# Patient Record
Sex: Male | Born: 1970 | Race: White | Hispanic: No | Marital: Single | Smoking: Current every day smoker
Health system: Southern US, Community
[De-identification: ages and names within clinical notes are randomized; demographics above are authoritative.]

## PROBLEM LIST (undated history)

## (undated) ENCOUNTER — Emergency Department (HOSPITAL_BASED_OUTPATIENT_CLINIC_OR_DEPARTMENT_OTHER): Admission: EM | Payer: Medicare PPO | Source: Home / Self Care

## (undated) DIAGNOSIS — F319 Bipolar disorder, unspecified: Secondary | ICD-10-CM

## (undated) DIAGNOSIS — Z5900 Homelessness unspecified: Secondary | ICD-10-CM

---

## 2010-08-28 ENCOUNTER — Emergency Department (HOSPITAL_COMMUNITY): Payer: Medicare Other

## 2010-08-28 ENCOUNTER — Emergency Department (HOSPITAL_COMMUNITY)
Admission: EM | Admit: 2010-08-28 | Discharge: 2010-08-29 | Disposition: A | Discharge status: Other Acute Inpatient Hospital | Payer: Medicare Other | Source: Home / Self Care | Attending: Emergency Medicine | Admitting: Emergency Medicine

## 2010-08-28 DIAGNOSIS — F29 Unspecified psychosis not due to a substance or known physiological condition: Secondary | ICD-10-CM | POA: Insufficient documentation

## 2010-08-28 DIAGNOSIS — F329 Major depressive disorder, single episode, unspecified: Secondary | ICD-10-CM | POA: Insufficient documentation

## 2010-08-28 DIAGNOSIS — F3289 Other specified depressive episodes: Secondary | ICD-10-CM | POA: Insufficient documentation

## 2010-08-28 LAB — DIFFERENTIAL
Eosinophils Absolute: 0 10*3/uL (ref 0.0–0.7)
Eosinophils Relative: 0 % (ref 0–5)
Lymphs Abs: 2.6 10*3/uL (ref 0.7–4.0)
Monocytes Absolute: 0.7 10*3/uL (ref 0.1–1.0)
Monocytes Relative: 8 % (ref 3–12)

## 2010-08-28 LAB — CBC
MCH: 31.1 pg (ref 26.0–34.0)
MCV: 89.1 fL (ref 78.0–100.0)
Platelets: 153 10*3/uL (ref 150–400)
RDW: 13.6 % (ref 11.5–15.5)

## 2010-08-29 ENCOUNTER — Emergency Department (HOSPITAL_COMMUNITY)
Admission: EM | Admit: 2010-08-29 | Discharge: 2010-08-30 | Disposition: A | Discharge status: Other Acute Inpatient Hospital | Payer: Medicare Other | Attending: Emergency Medicine | Admitting: Emergency Medicine

## 2010-08-29 DIAGNOSIS — F329 Major depressive disorder, single episode, unspecified: Secondary | ICD-10-CM | POA: Insufficient documentation

## 2010-08-29 DIAGNOSIS — F29 Unspecified psychosis not due to a substance or known physiological condition: Secondary | ICD-10-CM | POA: Insufficient documentation

## 2010-08-29 DIAGNOSIS — F3289 Other specified depressive episodes: Secondary | ICD-10-CM | POA: Insufficient documentation

## 2010-08-29 LAB — COMPREHENSIVE METABOLIC PANEL
Albumin: 4 g/dL (ref 3.5–5.2)
BUN: 8 mg/dL (ref 6–23)
BUN: 11 mg/dL (ref 6–23)
CO2: 27 mEq/L (ref 19–32)
Calcium: 9.6 mg/dL (ref 8.4–10.5)
Chloride: 103 mEq/L (ref 96–112)
Creatinine, Ser: 0.8 mg/dL (ref 0.4–1.5)
Creatinine, Ser: 0.93 mg/dL (ref 0.4–1.5)
GFR calc non Af Amer: >60 mL/min (ref >60)
Glucose, Bld: 114 mg/dL — ABNORMAL HIGH (ref 70–99)
Sodium: 141 mEq/L (ref 135–145)
Total Bilirubin: 0.2 mg/dL — ABNORMAL LOW (ref 0.3–1.2)
Total Protein: 6.1 g/dL (ref 6.0–8.3)
Total Protein: 6.6 g/dL (ref 6.0–8.3)

## 2010-08-29 LAB — CBC
HCT: 41.4 % (ref 39.0–52.0)
MCH: 30.5 pg (ref 26.0–34.0)
MCHC: 34.5 g/dL (ref 30.0–36.0)
MCV: 88.3 fL (ref 78.0–100.0)
RDW: 13.7 % (ref 11.5–15.5)

## 2010-08-29 LAB — RAPID URINE DRUG SCREEN, HOSP PERFORMED
Amphetamines: NOT DETECTED
Barbiturates: NOT DETECTED
Barbiturates: NOT DETECTED
Benzodiazepines: NOT DETECTED
Cocaine: NOT DETECTED
Opiates: NOT DETECTED
Opiates: NOT DETECTED

## 2010-08-29 LAB — DIFFERENTIAL
Basophils Absolute: 0 10*3/uL (ref 0.0–0.1)
Eosinophils Relative: 0 % (ref 0–5)
Lymphocytes Relative: 22 % (ref 12–46)
Monocytes Absolute: 0.7 10*3/uL (ref 0.1–1.0)
Monocytes Relative: 7 % (ref 3–12)

## 2010-08-29 LAB — ETHANOL
Alcohol, Ethyl (B): <5 mg/dL (ref 0–10)
Alcohol, Ethyl (B): <5 mg/dL (ref 0–10)

## 2010-08-30 ENCOUNTER — Inpatient Hospital Stay (HOSPITAL_COMMUNITY)
Admission: AD | Admit: 2010-08-30 | Discharge: 2010-09-07 | DRG: 885 | Disposition: A | Discharge status: Home / Self Care | Payer: Medicare Other | Attending: Psychiatry | Admitting: Psychiatry

## 2010-08-30 DIAGNOSIS — Z9119 Patient's noncompliance with other medical treatment and regimen: Secondary | ICD-10-CM

## 2010-08-30 DIAGNOSIS — F1210 Cannabis abuse, uncomplicated: Secondary | ICD-10-CM

## 2010-08-30 DIAGNOSIS — F259 Schizoaffective disorder, unspecified: Secondary | ICD-10-CM

## 2010-08-30 DIAGNOSIS — Z111 Encounter for screening for respiratory tuberculosis: Secondary | ICD-10-CM

## 2010-08-30 DIAGNOSIS — Z91199 Patient's noncompliance with other medical treatment and regimen due to unspecified reason: Secondary | ICD-10-CM

## 2010-08-30 DIAGNOSIS — Z5900 Homelessness unspecified: Secondary | ICD-10-CM

## 2010-08-30 DIAGNOSIS — F201 Disorganized schizophrenia: Principal | ICD-10-CM

## 2010-08-30 DIAGNOSIS — E119 Type 2 diabetes mellitus without complications: Secondary | ICD-10-CM

## 2010-08-30 DIAGNOSIS — F1010 Alcohol abuse, uncomplicated: Secondary | ICD-10-CM

## 2010-08-31 DIAGNOSIS — F259 Schizoaffective disorder, unspecified: Secondary | ICD-10-CM

## 2010-08-31 NOTE — H&P (Signed)
NAME:  Roberto Osborn, Roberto Osborn               ACCOUNT NO.:  [id number]  MEDICAL RECORD NO.:  [id number]           PATIENT TYPE:  I  LOCATION:  0404                          FACILITY:  [facility]  PHYSICIAN:  Eulogio Ditch, MD DATE OF BIRTH:  08-02-1970  DATE OF ADMISSION:  08/30/2010 DATE OF DISCHARGE:                      PSYCHIATRIC ADMISSION ASSESSMENT   REASON FOR ADMISSION:  Disorganized behavior.  HISTORY OF PRESENT ILLNESS:  A 40 year old male with history of schizoaffective disorder and polysubstance abuse was admitted from [facility] ED.  Patient is very disorganized, unable to make any logical conversation.  Patient denies hearing any voices but seems to be internally preoccupied.  Patient was in [place] and came to [place] to look for a family member but he is unable to tell the address of the family member and was not even clear about the name of the member. Patient denies any suicidal or homicidal ideations.  In the ER, patient told counselor "the top bunk, falling to his grave, going to a rest home", uses numbers at times with no elaboration.  PREVIOUS PSYCH HISTORY:  Patient has multiple hospitalizations in the past.  Patient told me that he was in the [facility] and was on Clozaril but was unable to tell me how much Clozaril he takes and whether he is compliant with the medication or not.  Patient also is unable to tell me at this time the previous psych hospitalizations, when they were, and for what reason.  SUBSTANCE ABUSE HISTORY:  Patient has a history of substance abuse but he is not able to elaborate on his alcohol and marijuana abuse.  SOCIAL HISTORY:  Patient is homeless.  Besides that we do not have any further information at this time on this patient.  MEDICAL HISTORY:  Patient has a history of diabetes.  NO KNOWN DRUG ALLERGIES.  PHYSICAL EXAMINATION:  Done in the [facility] ER is within normal limits. CARDIOVASCULAR:  Regular rate and rhythm. CHEST:   Bilaterally clear. ABDOMEN:  Soft, nontender, nondistended.  No guarding. EXTREMITIES:  No edema present.  LABS:  His urine drug screen is negative.  Sodium 141, potassium 4.3, glucose 114, BUN 11, creatinine 0.93, AST 20, ALT 26.  Alcohol level less than 5.  WBC count 9.6, hemoglobin 14.3.  MENTAL STATUS EXAMINATION:  Patient is calm, cooperative during the interview.  Fair eye contact.  Hygiene, grooming fair to poor.  Patient has involuntary movements in both of the forearms.  It can be related to tardive dyskinesia movements.  Mood as per patient okay.  Affect constricted.  Speech soft, slow.  Thought process, unable to make any logical conversation.  Thought blocking positive.  Thought content, not suicidal or homicidal.  Patient seems to be paranoid.  Thought perception, denies hearing any voices but is internally preoccupied responding to the inner stimuli.  Cognition, alert, awake, oriented x3. Memory, immediate, recent, remote poor.  Attention and concentration poor.  Abstraction ability poor.  Insight and judgment poor.  DIAGNOSES:  AXIS I: 1. 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 2. Polysubstance abuse, abusing alcohol and marijuana. AXIS II:  Deferred.  AXIS III:  No active medical issue. AXIS IV:  Homeless. AXIS V:  30.  RECOMMENDATIONS: 1. Patient will be started on Risperdal 4 mg at bedtime.  I will     discontinue the Clozaril as patient is noncompliant with this     medication. 2. I will continue with Celexa 20 mg p.o. daily and Wellbutrin 75 mg     p.o. daily. 3. Started Benadryl 50 mg at bedtime for sleep and to prevent EPS from     Risperdal. 4. We will get more collateral information from this patient including     the past psych hospitalization records. 5. Estimated length of stay in the hospital will be 7 to 10 days.     Eulogio Ditch, MD     SA/MEDQ  D:  08/31/2010  T:  08/31/2010  Job:   [id number]  Electronically Signed by Eulogio Ditch  on 08/31/2010 05:59:12 PM

## 2010-09-01 LAB — GLUCOSE, CAPILLARY: Glucose-Capillary: 140 mg/dL — ABNORMAL HIGH (ref 70–99)

## 2010-09-02 LAB — GLUCOSE, CAPILLARY
Glucose-Capillary: 110 mg/dL — ABNORMAL HIGH (ref 70–99)
Glucose-Capillary: 141 mg/dL — ABNORMAL HIGH (ref 70–99)

## 2010-09-03 LAB — GLUCOSE, CAPILLARY

## 2010-09-04 LAB — GLUCOSE, CAPILLARY: Glucose-Capillary: 167 mg/dL — ABNORMAL HIGH (ref 70–99)

## 2010-09-05 LAB — GLUCOSE, CAPILLARY: Glucose-Capillary: 110 mg/dL — ABNORMAL HIGH (ref 70–99)

## 2010-09-06 LAB — GLUCOSE, CAPILLARY
Glucose-Capillary: 96 mg/dL (ref 70–99)
Glucose-Capillary: 91 mg/dL (ref 70–99)

## 2010-09-27 ENCOUNTER — Emergency Department (HOSPITAL_COMMUNITY)
Admission: EM | Admit: 2010-09-27 | Discharge: 2010-09-27 | Disposition: A | Discharge status: Home / Self Care | Payer: Medicare Other | Attending: Emergency Medicine | Admitting: Emergency Medicine

## 2010-09-27 DIAGNOSIS — H9209 Otalgia, unspecified ear: Secondary | ICD-10-CM | POA: Insufficient documentation

## 2010-09-27 DIAGNOSIS — E7800 Pure hypercholesterolemia, unspecified: Secondary | ICD-10-CM | POA: Insufficient documentation

## 2010-09-27 DIAGNOSIS — H60399 Other infective otitis externa, unspecified ear: Secondary | ICD-10-CM | POA: Insufficient documentation

## 2010-09-27 DIAGNOSIS — Z79899 Other long term (current) drug therapy: Secondary | ICD-10-CM | POA: Insufficient documentation

## 2010-09-27 DIAGNOSIS — R220 Localized swelling, mass and lump, head: Secondary | ICD-10-CM | POA: Insufficient documentation

## 2010-09-27 DIAGNOSIS — R6884 Jaw pain: Secondary | ICD-10-CM | POA: Insufficient documentation

## 2010-09-27 DIAGNOSIS — E119 Type 2 diabetes mellitus without complications: Secondary | ICD-10-CM | POA: Insufficient documentation

## 2010-09-28 ENCOUNTER — Emergency Department (HOSPITAL_COMMUNITY)
Admission: EM | Admit: 2010-09-28 | Discharge: 2010-09-28 | Disposition: A | Discharge status: Home / Self Care | Payer: Medicare Other | Attending: Emergency Medicine | Admitting: Emergency Medicine

## 2010-09-28 DIAGNOSIS — M25579 Pain in unspecified ankle and joints of unspecified foot: Secondary | ICD-10-CM | POA: Insufficient documentation

## 2010-09-28 DIAGNOSIS — E119 Type 2 diabetes mellitus without complications: Secondary | ICD-10-CM | POA: Insufficient documentation

## 2010-09-28 DIAGNOSIS — E7800 Pure hypercholesterolemia, unspecified: Secondary | ICD-10-CM | POA: Insufficient documentation

## 2010-09-28 DIAGNOSIS — H938X9 Other specified disorders of ear, unspecified ear: Secondary | ICD-10-CM | POA: Insufficient documentation

## 2010-09-28 DIAGNOSIS — H9209 Otalgia, unspecified ear: Secondary | ICD-10-CM | POA: Insufficient documentation

## 2010-09-28 DIAGNOSIS — R599 Enlarged lymph nodes, unspecified: Secondary | ICD-10-CM | POA: Insufficient documentation

## 2010-09-28 DIAGNOSIS — Z79899 Other long term (current) drug therapy: Secondary | ICD-10-CM | POA: Insufficient documentation

## 2010-09-28 DIAGNOSIS — H60399 Other infective otitis externa, unspecified ear: Secondary | ICD-10-CM | POA: Insufficient documentation

## 2010-09-29 NOTE — Discharge Summary (Signed)
NAME:  Roberto Osborn, Roberto Osborn               ACCOUNT NO.:  [id number]  MEDICAL RECORD NO.:  [id number]           PATIENT TYPE:  I  LOCATION:  0404                          FACILITY:  BH  PHYSICIAN:  Eulogio Ditch, MD DATE OF BIRTH:  [birth year]  DATE OF ADMISSION:  08/30/2010 DATE OF DISCHARGE:  09/07/2010                              DISCHARGE SUMMARY   IDENTIFYING INFORMATION:  This is a 40 year old Caucasian male.  This is a voluntary admission.  HISTORY OF PRESENT ILLNESS:  Roberto Osborn presented with exacerbation of his schizoaffective disorder.  He denied hearing any voices, but appeared to be significantly internally preoccupied.  He had been a patient in [place] and came to [place] looking for a family member, but was unable to give any meaningful information about the person's identity.  His speech in the emergency room was nonsensical.  He was referred for further psychiatric evaluation and stabilization.  This was Roberto Osborn's first admission to [facility].  He had previously been in hospitals in [address] including the hospital in [place] and [facility] in the distant past.  He reported previously taking clozapine, but was unable to give much meaningful information about medications.  He endorsed a previous history of alcohol and marijuana abuse, but would not give details.  MEDICAL EVALUATION:  Full physical exam was done in the [facility] Emergency Room.  He was found to have no chronic medical problems.  LABORATORY DATA:  Urine drug screen negative for all substances. Chemistry is normal.  BUN 11, creatinine 0.93.  Liver enzymes normal. Hemoglobin 14.3.  Alcohol level negative.  COURSE OF HOSPITALIZATION:  He was admitted to our acute stabilization unit and given a provisional diagnosis of schizophrenia, disorganized type, versus schizoaffective disorder.  He was gradually assimilated into the milieu.  He was calm and cooperative during his initial  mental status exam.  Grooming noted to be fair to poor.  He was noted to have some involuntary movements in both of his forearms that appeared tardive in nature.  Thought process was positive for some  thought blocking and disorganization, unable to make any logical conversation.  He also appeared to be guarded and paranoid.  Abstraction ability, insight and judgment all significantly impaired.  He was started on Risperdal 4 mg p.o. nightly.  Since he was not taking the clozapine, we elected not to restart this.  We also started him on Celexa 20 mg daily which he reported previously taking and Wellbutrin 75 mg daily.  We also gave him Benadryl 50 mg nightly.  Roberto Osborn was appropriate and cooperative on the unit for the first few days.  Affect continued to be quite blunt.  Eye contact fairly good, but offered minimal information.  He appeared to have poverty of thought, some thought blocking and predominantly negative symptoms.  By Sep 05, 2010, he was speaking a little bit more.  Roberto Osborn that he had been living in a group home in [place], but did not want to return there.  Did not really want to offer any information.  Roberto Osborn that he had a family member living here in [place], but  we doubted the validity at this statement because he was unable to give any identifying information and seemed vague about who this might be.  By Sep 16, 2010, he was up with appropriate behavior, attending meals and groups, although he continued to appear somewhat internally distracted, laughing inappropriately at times throughout group therapy.  Our case manager contacted [facility] who agreed to accept him.  Roberto Osborn was polite, cooperative.  Symptoms were manageable.  He was able to be easily redirected if he appeared internally distracted.  Our case manager completed paperwork for Roberto Osborn to get a representative payee.  [facility] was going to make arrangements for medical and  psychiatric followup.  DISCHARGE DIAGNOSES:  AXIS I:  Psychosis not otherwise specified, rule out schizophrenia, disorganized type. AXIS II:  No diagnosis. AXIS III:  No diagnosis. AXIS IV:  Homelessness, stabilized. AXIS V:  Current 53, past year not known.  DISCHARGE CONDITION:  Stable.  DISCHARGE MEDICATIONS: 1. Citalopram 40 mg daily. 2. Diphenhydramine 50 mg daily at bedtime. 3. Risperdal 6 mg nightly. 4. Actos 30 mg one tablet daily which he had been taking for diabetes     mellitus type 2. 5. Aspirin 81 mg daily. 6. Colace 100 mg daily. 7. Metamucil 1 teaspoon daily. 8. Simvastatin 10 mg daily at bedtime for cholesterol management. 9. He was instructed to discontinue bupropion, diclofenac, trazodone,     clonidine, lorazepam, clozapine and Vicodin.     Margaret A. Lorin Picket, N.P.   ______________________________ Eulogio Ditch, MD    MAS/MEDQ  D:  09/23/2010  T:  09/24/2010  Job:  [id number]  Electronically Signed by Kari Baars N.P. on 09/28/2010 01:05:27 PM Electronically Signed by Eulogio Ditch  on 09/29/2010 05:30:01 PM

## 2010-10-17 ENCOUNTER — Emergency Department (HOSPITAL_COMMUNITY)
Admission: EM | Admit: 2010-10-17 | Discharge: 2010-10-17 | Disposition: A | Discharge status: Home / Self Care | Payer: Medicare Other | Attending: Emergency Medicine | Admitting: Emergency Medicine

## 2010-10-17 DIAGNOSIS — E119 Type 2 diabetes mellitus without complications: Secondary | ICD-10-CM | POA: Insufficient documentation

## 2010-10-17 DIAGNOSIS — S93409A Sprain of unspecified ligament of unspecified ankle, initial encounter: Secondary | ICD-10-CM | POA: Insufficient documentation

## 2010-10-17 DIAGNOSIS — X58XXXA Exposure to other specified factors, initial encounter: Secondary | ICD-10-CM | POA: Insufficient documentation

## 2010-10-17 DIAGNOSIS — E7800 Pure hypercholesterolemia, unspecified: Secondary | ICD-10-CM | POA: Insufficient documentation

## 2010-10-17 DIAGNOSIS — M25579 Pain in unspecified ankle and joints of unspecified foot: Secondary | ICD-10-CM | POA: Insufficient documentation

## 2012-04-14 IMAGING — CT CT HEAD W/O CM
1 of 2 series · 13 of 30 positions shown, 17 images · non-contrast
Comparison: None.

CLINICAL DATA: Confusion

CT HEAD WITHOUT CONTRAST
TECHNIQUE: Contiguous axial images were obtained from the base of
the skull through the vertex without contrast.

[Series 2: brain · axial · 0.47mm/px · z∈[+149,+310]mm · 13 of 36 slices shown, 17 images]
[im 3/36  brain]
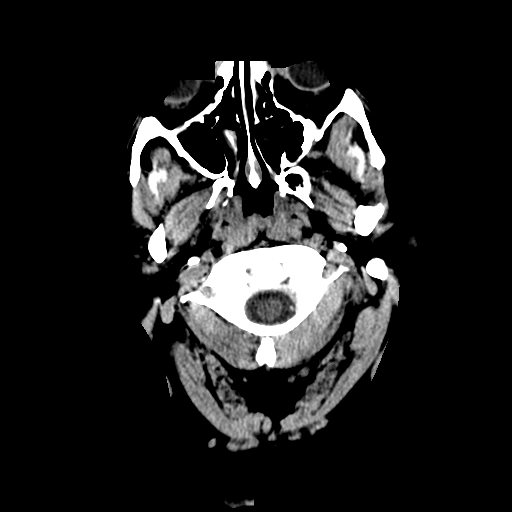
[im 3/36  bone]
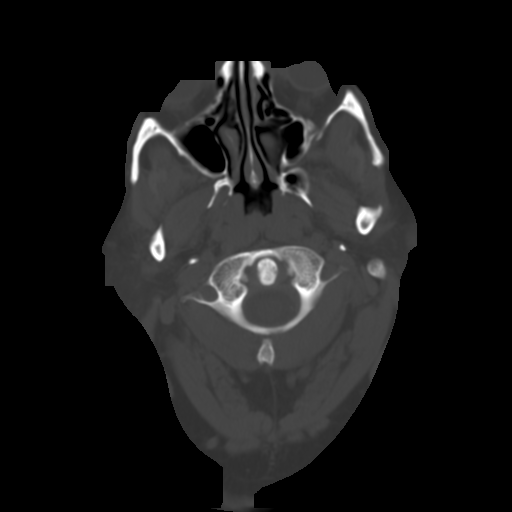
[im 6/36  brain]
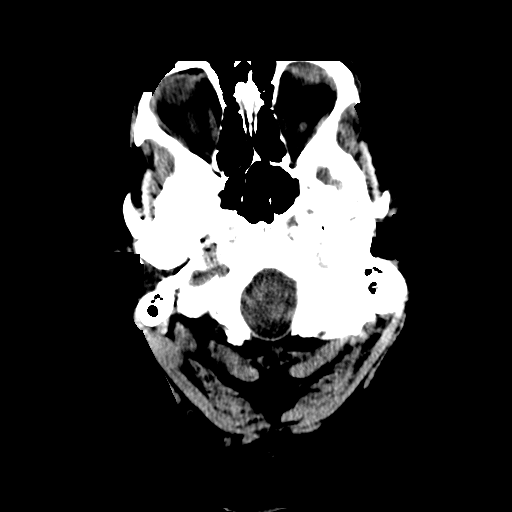
[im 8/36  brain]
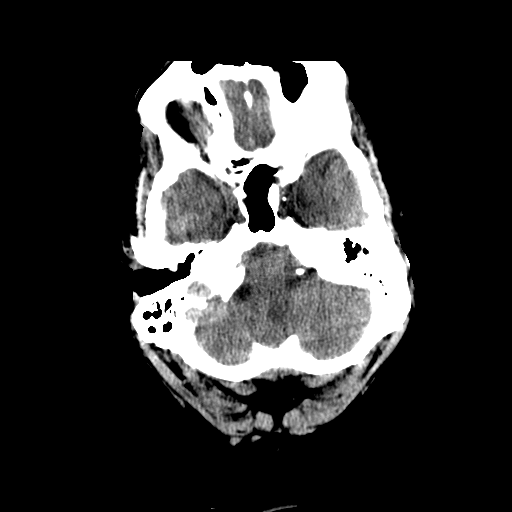
[im 11/36  brain]
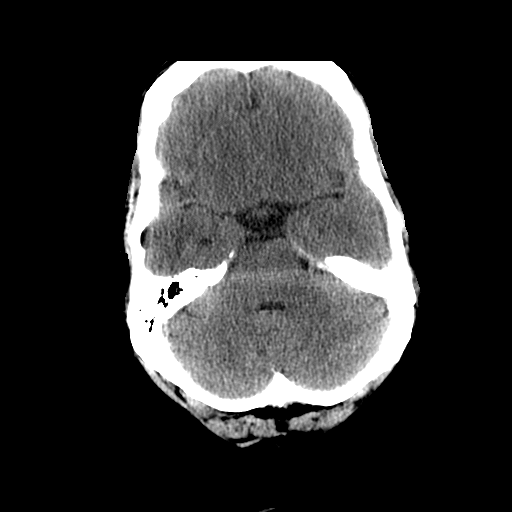
[im 13/36  brain]
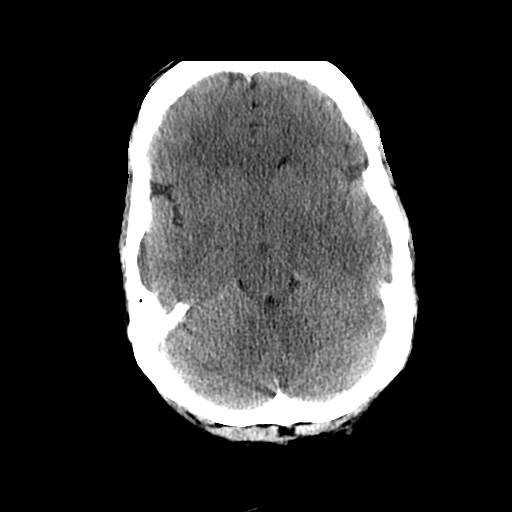
[im 13/36  bone]
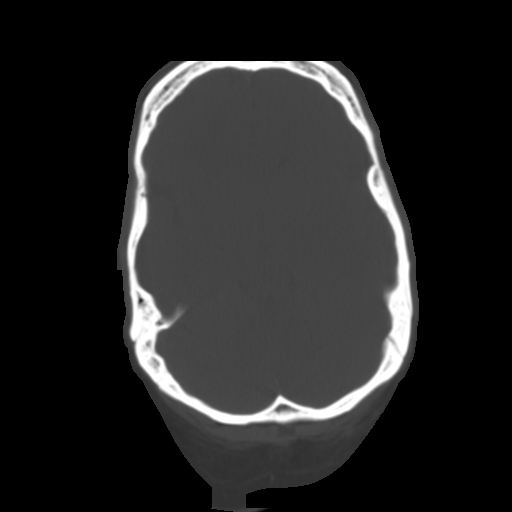
[im 16/36  brain]
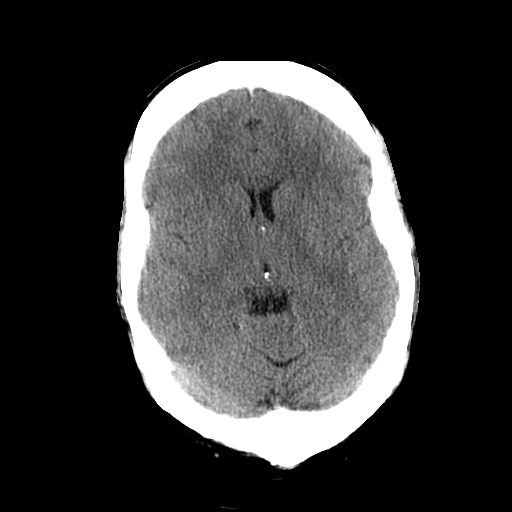
[im 18/36  brain]
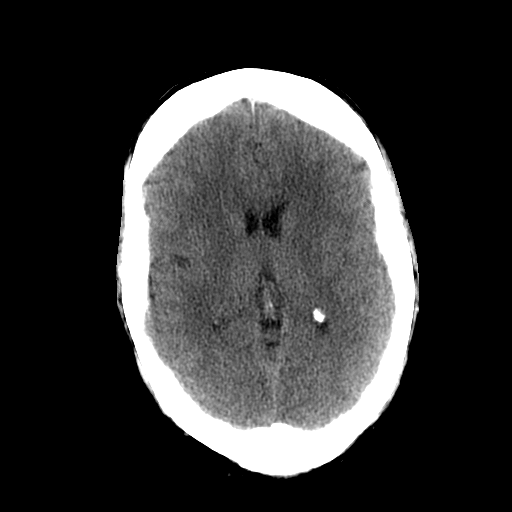
[im 21/36  brain]
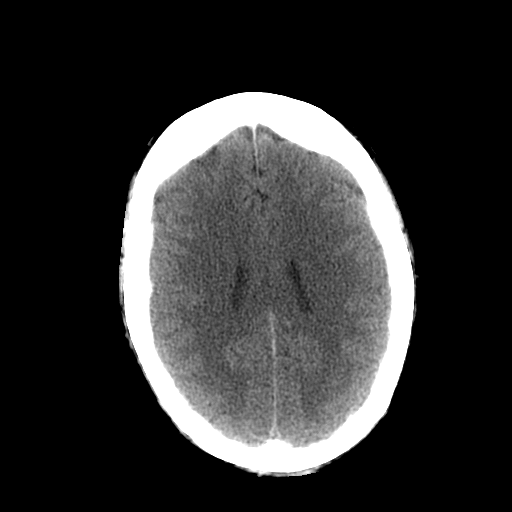
[im 23/36  brain]
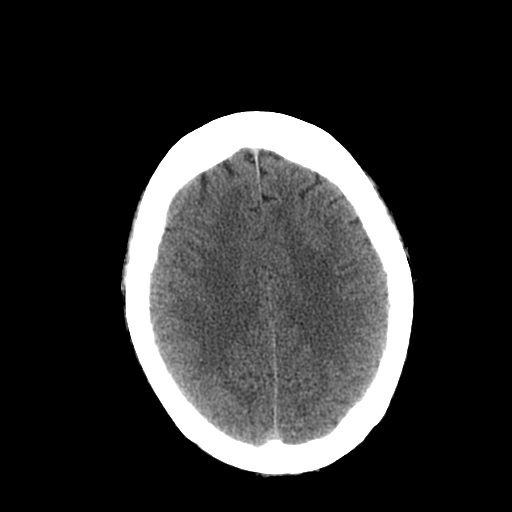
[im 23/36  bone]
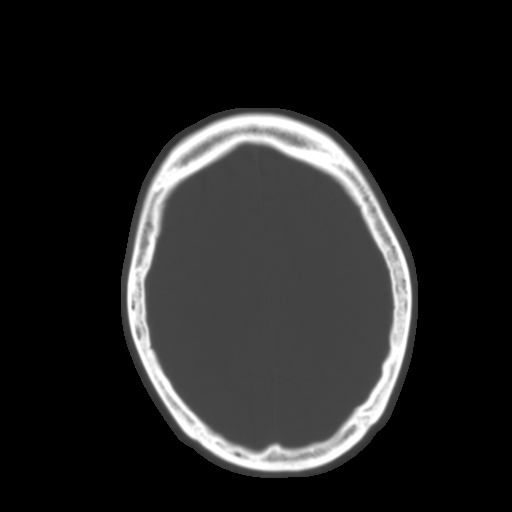
[im 26/36  brain]
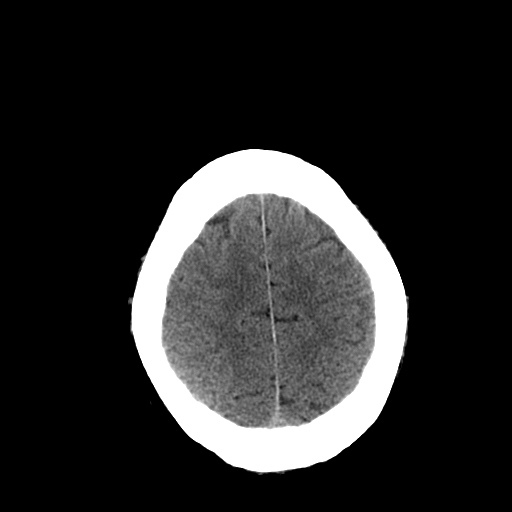
[im 28/36  brain]
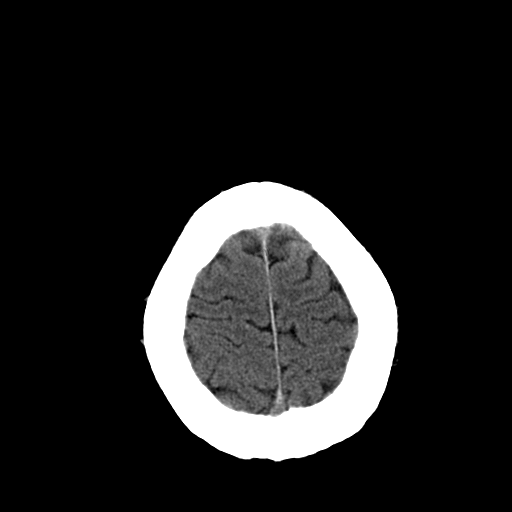
[im 31/36  brain]
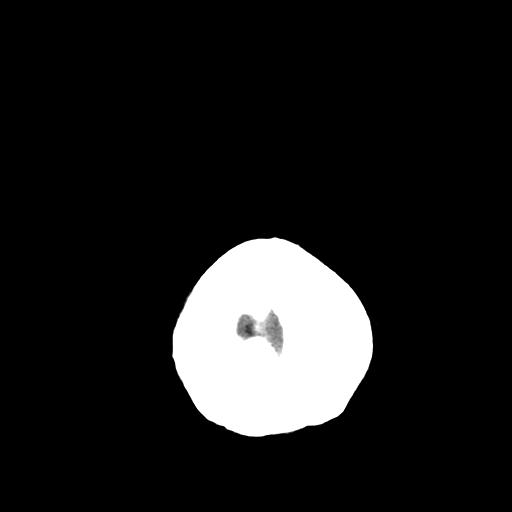
[im 33/36  brain]
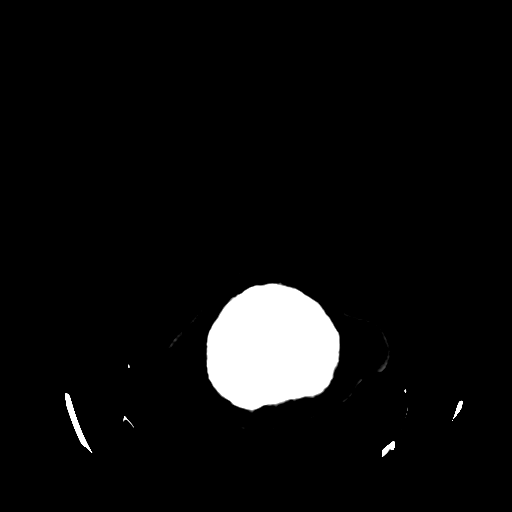
[im 33/36  bone]
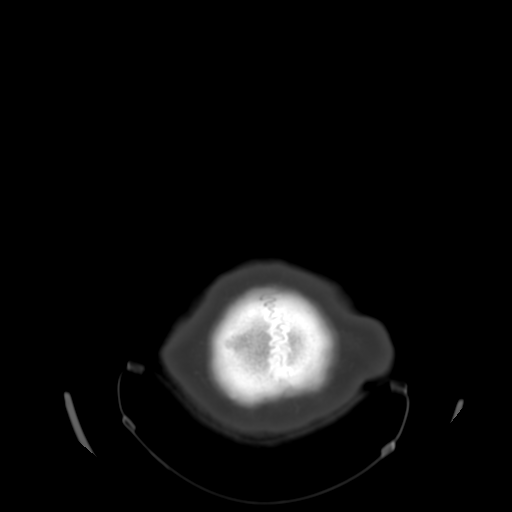

[13 of 30 positions shown; findings below may reference images not displayed]

FINDINGS: No acute intracranial hemorrhage.  No focal mass lesion.
No CT evidence of acute infarction.  No midline shift or mass
effect.  No hydrocephalus.  Basilar cisterns are patent.

Paranasal sinuses and mastoid air cells are clear.  Orbits are
normal.
IMPRESSION: No acute intracranial findings.

## 2021-02-24 ENCOUNTER — Encounter (HOSPITAL_COMMUNITY): Payer: Self-pay

## 2021-02-24 ENCOUNTER — Emergency Department (HOSPITAL_COMMUNITY)
Admission: EM | Admit: 2021-02-24 | Discharge: 2021-02-25 | Disposition: A | Discharge status: Home / Self Care | Payer: Medicare PPO | Attending: Emergency Medicine | Admitting: Emergency Medicine

## 2021-02-24 DIAGNOSIS — F4322 Adjustment disorder with anxiety: Secondary | ICD-10-CM | POA: Diagnosis not present

## 2021-02-24 DIAGNOSIS — Z5902 Unsheltered homelessness: Secondary | ICD-10-CM | POA: Insufficient documentation

## 2021-02-24 DIAGNOSIS — L550 Sunburn of first degree: Secondary | ICD-10-CM | POA: Insufficient documentation

## 2021-02-24 DIAGNOSIS — Z20822 Contact with and (suspected) exposure to covid-19: Secondary | ICD-10-CM | POA: Insufficient documentation

## 2021-02-24 DIAGNOSIS — R45851 Suicidal ideations: Secondary | ICD-10-CM | POA: Diagnosis not present

## 2021-02-24 DIAGNOSIS — F329 Major depressive disorder, single episode, unspecified: Secondary | ICD-10-CM

## 2021-02-24 DIAGNOSIS — Z5900 Homelessness unspecified: Secondary | ICD-10-CM

## 2021-02-24 DIAGNOSIS — F4320 Adjustment disorder, unspecified: Secondary | ICD-10-CM

## 2021-02-24 LAB — COMPREHENSIVE METABOLIC PANEL
ALT: 21 U/L (ref 0–44)
AST: 23 U/L (ref 15–41)
Albumin: 3.9 g/dL (ref 3.5–5.0)
Alkaline Phosphatase: 53 U/L (ref 38–126)
Anion gap: 7 (ref 5–15)
BUN: 12 mg/dL (ref 6–20)
CO2: 25 mmol/L (ref 22–32)
Calcium: 9.5 mg/dL (ref 8.9–10.3)
Chloride: 105 mmol/L (ref 98–111)
Creatinine, Ser: 0.81 mg/dL (ref 0.61–1.24)
GFR, Estimated: >60 mL/min (ref >60)
Glucose, Bld: 107 mg/dL — ABNORMAL HIGH (ref 70–99)
Potassium: 4.1 mmol/L (ref 3.5–5.1)
Sodium: 137 mmol/L (ref 135–145)
Total Bilirubin: 0.5 mg/dL (ref 0.3–1.2)
Total Protein: 6.1 g/dL — ABNORMAL LOW (ref 6.5–8.1)

## 2021-02-24 LAB — CBC
HCT: 42.6 % (ref 39.0–52.0)
Hemoglobin: 14.2 g/dL (ref 13.0–17.0)
MCH: 29.6 pg (ref 26.0–34.0)
MCHC: 33.3 g/dL (ref 30.0–36.0)
MCV: 88.9 fL (ref 80.0–100.0)
Platelets: 172 10*3/uL (ref 150–400)
RBC: 4.79 MIL/uL (ref 4.22–5.81)
RDW: 13.2 % (ref 11.5–15.5)
WBC: 10.4 10*3/uL (ref 4.0–10.5)
nRBC: 0 % (ref 0.0–0.2)

## 2021-02-24 LAB — SALICYLATE LEVEL: Salicylate Lvl: <7 mg/dL — ABNORMAL LOW (ref 7.0–30.0)

## 2021-02-24 LAB — ETHANOL: Alcohol, Ethyl (B): <10 mg/dL (ref <10)

## 2021-02-24 LAB — ACETAMINOPHEN LEVEL: Acetaminophen (Tylenol), Serum: <10 ug/mL — ABNORMAL LOW (ref 10–30)

## 2021-02-24 NOTE — ED Triage Notes (Signed)
Pt states that he is homeless and he is suicidal, pt states that people are hurting him and make fun of him, denies HI, states he has AVH. Pt also states he hurt his hands with shampoo, pts hands have red rash to them

## 2021-02-24 NOTE — ED Provider Notes (Addendum)
Emergency Medicine Provider Triage Evaluation Note  Roberto Osborn , a 50 y.o. male  was evaluated in triage.  Pt complains of suicidal ideation and pain of his bilateral hands.  Patient states he has been feeling depressed, with a plan to take his own life.  History of prior suicide attempt.  He is unable to give me details in triage.  Of note bilateral hands have red rash, states that he hurt his hand using shampoo.  States that people are hurting him and making fun of him.  Review of Systems  Positive: Suicidal ideation, hand pain Negative: Homicidal ideation, visual or auditory hallucinations  Physical Exam  BP (!) 143/99 (BP Location: Right Arm)   Pulse 96   Temp 98.2 F (36.8 C) (Oral)   Resp 20   SpO2 97%  Gen:   Awake, no distress   Resp:  Normal effort  MSK:   Moves extremities without difficulty  Other:  Bilateral hand erythema, full passive ROM of bilateral hands  Medical Decision Making  Medically screening exam initiated at 9:12 PM.  Appropriate orders placed.  Roberto Osborn was informed that the remainder of the evaluation will be completed by another provider, this initial triage assessment does not replace that evaluation, and the importance of remaining in the ED until their evaluation is complete.   Will obtain medical screening labs and consult TTS    Jeanella Flattery 02/24/21 2113    Charlynne Pander, MD 02/24/21 [phone]

## 2021-02-24 NOTE — ED Notes (Signed)
Pt belongings placed in locker 4

## 2021-02-25 ENCOUNTER — Ambulatory Visit (INDEPENDENT_AMBULATORY_CARE_PROVIDER_SITE_OTHER)
Admission: EM | Admit: 2021-02-25 | Discharge: 2021-02-25 | Disposition: A | Discharge status: Home / Self Care | Payer: Medicare PPO | Source: Home / Self Care

## 2021-02-25 ENCOUNTER — Other Ambulatory Visit: Payer: Self-pay

## 2021-02-25 DIAGNOSIS — Z5900 Homelessness unspecified: Secondary | ICD-10-CM

## 2021-02-25 DIAGNOSIS — F329 Major depressive disorder, single episode, unspecified: Secondary | ICD-10-CM

## 2021-02-25 DIAGNOSIS — F4322 Adjustment disorder with anxiety: Secondary | ICD-10-CM

## 2021-02-25 DIAGNOSIS — F4320 Adjustment disorder, unspecified: Secondary | ICD-10-CM

## 2021-02-25 DIAGNOSIS — Z5902 Unsheltered homelessness: Secondary | ICD-10-CM | POA: Insufficient documentation

## 2021-02-25 LAB — RAPID URINE DRUG SCREEN, HOSP PERFORMED
Amphetamines: NOT DETECTED
Barbiturates: NOT DETECTED
Benzodiazepines: NOT DETECTED
Cocaine: NOT DETECTED
Opiates: NOT DETECTED
Tetrahydrocannabinol: NOT DETECTED

## 2021-02-25 LAB — RESP PANEL BY RT-PCR (FLU A&B, COVID) ARPGX2
Influenza A by PCR: NEGATIVE
Influenza B by PCR: NEGATIVE
SARS Coronavirus 2 by RT PCR: NEGATIVE

## 2021-02-25 MED ORDER — LUBRIDERM SERIOUSLY SENSITIVE EX LOTN
TOPICAL_LOTION | Freq: Two times a day (BID) | CUTANEOUS | Status: DC
  Filled 2021-02-25: qty 473

## 2021-02-25 MED ORDER — ACETAMINOPHEN 325 MG PO TABS
650.0000 mg | ORAL_TABLET | ORAL | Status: DC | PRN
  Administered 2021-02-25: 650 mg via ORAL
  Filled 2021-02-25: qty 2

## 2021-02-25 MED ORDER — ONDANSETRON HCL 4 MG PO TABS: 4.0000 mg | ORAL_TABLET | Freq: Three times a day (TID) | ORAL | Status: DC | PRN

## 2021-02-25 MED ORDER — NICOTINE 21 MG/24HR TD PT24
21.0000 mg | MEDICATED_PATCH | Freq: Every day | TRANSDERMAL | Status: DC
  Administered 2021-02-25: 21 mg via TRANSDERMAL
  Filled 2021-02-25: qty 1

## 2021-02-25 MED ORDER — ALUM & MAG HYDROXIDE-SIMETH 200-200-20 MG/5ML PO SUSP: 30.0000 mL | Freq: Four times a day (QID) | ORAL | Status: DC | PRN

## 2021-02-25 NOTE — ED Notes (Signed)
Pt returned back to room and is resting in bed

## 2021-02-25 NOTE — Progress Notes (Signed)
   02/25/21 1646  [facility] Triage Screening (Walk-ins at [facility] only)  How Did You Hear About [place]? Self  What Is the Reason for Your Visit/Call Today? Pt is a 50 yo male presenting voluntarily and unaccompanied due to looking for "placement" meaning housing. Pt is homeless. Pt stated that he was at [facility] ED last night and they "sent him over here for placement." Pt has a hx of schizophrenia and has no current OP providers. Pt denies SI and HI but stated he can see President Biden who does not like him and has told pt he will hurt him. It is unclear whether pt is truly delusional or not. Pt stated that "people abuse me all the time and are always out to get me" indicating a level of paranoia. Pt stated he has had IP psych admission and stated the last one was in 2012 at [facility]. Pt does not appear to be responding to internal stimuli.Pt denies any alcohol or drug use but appears he may be intoxicated.  How Long Has This Been Causing You Problems? > than 6 months  Have You Recently Had Any Thoughts About Hurting Yourself? No  Are You Planning to Commit Suicide/Harm Yourself At This time? No  Have you Recently Had Thoughts About Hurting Someone [name]? No  Are You Planning To Harm Someone At This Time? No  Are you currently experiencing any auditory, visual or other hallucinations? No (possibly)  Have You Used Any Alcohol or Drugs in the Past 24 Hours? No (Pt denies)  Do you have any current medical co-morbidities that require immediate attention? No  Clinician description of patient physical appearance/behavior: Pt is casually dressed and is disheveled. Pt appears sun burnt and inadeqauately groomed. Pt's mood is depressed and his affect is congruent. Pt's sppech and movement are within normal limits. Pt is cooperative and polite but has had trouble understanding simple directions related to being scanned and registered.  What Do You Feel Would Help You the Most Today? Housing Assistance  If access to [facility]  was not available, would you have sought care in the Emergency Department? Yes  Determination of Need Routine (7 days)  Corrie Dandy T. Jimmye Norman, MS, [facility], [facility] Triage Specialist [facility]

## 2021-02-25 NOTE — ED Notes (Signed)
TTS in process 

## 2021-02-25 NOTE — ED Notes (Signed)
Pt is becoming increasingly restless, displaying more psychotic features including pains in all extremities, he is started to speak more rapidly and appearing anxious

## 2021-02-25 NOTE — ED Notes (Signed)
Pt is agreeable to discharge, he is given instructions for route to [facility] clinic, he is a/ox4, vitals are within normal limits

## 2021-02-25 NOTE — ED Provider Notes (Signed)
Behavioral Health Urgent Care Medical Screening Exam  Patient Name: Roberto Osborn MRN: [id number] Date of Evaluation: 02/25/21 Diagnosis:  Final diagnoses:  Adjustment disorder with anxious mood  Homelessness    History of Present illness: Roberto Osborn is a 50 y.o. male patient who presents to the [facility] voluntarily as a walk-in unaccompanied chief complaint of looking for "placement" meaning housing. Pt is homeless. Patient evaluated at [facility] today by psychiatry and psych cleared.   Patient seen and evaluated face-to-face by this provider, chart reviewed and case discussed with Dr. Bronwen Betters. On evaluation, patient is alert and oriented x4. His thought process is logical and speech is coherent. His mood is euthymic and affect is congruent. He is calm and cooperative and overly complements this provider. He reports that he walked here from [facility] after he was told to come here for help because he is living on the streets. He states "I will die if I keep living on the streets." He states that he needs a place to stay so that he can shower, have food and a bed. He states that he does not have any family in the area and that his brother is dead. He states that he is homeless and sleeps outside. According previous notes, the patient recently relocated to the [place] area 2 days ago looking for a place to stay. He reports poor sleep due to sleeping on the concrete. He reports a fair appetite. He denies having thoughts of wanting to kill himself or others. He denies hearing voices or seeing things that other people cannot hear or see. He does not appear to be responding to internal or external stimuli. He states that he does not have a current psychiatrist or therapist. He denies a past history of psychiatric inpatient treatment or a diagnosis. He denies drinking alcohol or using illicit drugs.The [name] counselor spoke with the supervisor at the [facility] who states that they have bed availability, however the patient states that he was kicked out of the [facility] and cannot go back after he yelled at "two black kids." The [facility] does not have bed availability due to covid outbreak. Patient provided with resources for shelter/housing.      Psychiatric Specialty Exam  Presentation  General Appearance:Appropriate for Environment  Eye Contact:Good  Speech:Clear and Coherent  Speech Volume:Normal  Handedness:Right   Mood and Affect  Mood:Euthymic  Affect:Congruent   Thought Process  Thought Processes:Coherent; Goal Directed  Descriptions of Associations:Intact  Orientation:Full (Time, Place and Person)  Thought Content:Logical    Hallucinations:None  Ideas of Reference:None  Suicidal Thoughts:No  Homicidal Thoughts:No   Sensorium  Memory:Immediate Fair; Remote Fair; Recent Fair  Judgment:Fair  Insight:Fair   Executive Functions  Concentration:Fair  Attention Span:Fair  Recall:Fair  Fund of Knowledge:Fair  Language:Fair   Psychomotor Activity  Psychomotor Activity:Normal   Assets  Assets:Communication Skills; Desire for Improvement; Physical Health; Leisure Time   Sleep  Sleep:Poor  Number of hours: 8  Physical Exam: Physical Exam Constitutional:      Appearance: Normal appearance.  HENT:     Head: Atraumatic.     Nose: Nose normal.  Eyes:     Conjunctiva/sclera: Conjunctivae normal.  Cardiovascular:     Rate and Rhythm: Tachycardia present.  Pulmonary:     Effort: Pulmonary effort is normal.  Musculoskeletal:     Cervical back: Normal range of motion.  Neurological:     Mental Status:  He is alert and oriented to person, place, and time.   Review of Systems  Constitutional: Negative.   HENT: Negative.    Eyes: Negative.   Respiratory: Negative.    Cardiovascular: Negative.   Gastrointestinal: Negative.   Genitourinary: Negative.    Musculoskeletal: Negative.   Skin: Negative.   Neurological: Negative.   Endo/Heme/Allergies: Negative.   Blood pressure (!) 139/94, pulse (!) 126, temperature 97.7 F (36.5 C), temperature source Oral, resp. rate 18, SpO2 98 %. There is no height or weight on file to calculate BMI.  Musculoskeletal: Strength & Muscle Tone: within normal limits Gait & Station: normal Patient leans: N/A   [facility] MSE Discharge Disposition for Follow up and Recommendations: Based on my evaluation the patient does not appear to have an emergency medical condition and can be discharged with resources and follow up care in outpatient services for Individual Therapy and Group Therapy  Follow up with the resources provided:    Follow-up Information     [facility]) Follow up.   Contact information: [address]  [place], [place] [address]  Phone: [phone]        [facility] Follow up.   Contact information: [facility]  [address] [place], [place], [place] [address]  Hours: Open today  8AM-5PM  Phone: [phone]  Breakfast: 6:30-7:30 am  Lunch served: 10:40 am - 12:40pm        [facility] Follow up.   Why: Call between the hours of 9am-5pm Mon-Fri. [facility] will contact all the local shelters to find openings. Right now they are requiring people to quarantine at a hotel before they can go to an open bed but [facility] may be able to set that up as well. They are not open on weekends. Contact information: [phone]                 Layla Barter, NP 02/25/2021, 5:45 PM

## 2021-02-25 NOTE — Discharge Instructions (Signed)
In the event of worsening symptoms call the crisis hotline, 911, and or go to the nearest emergency department for appropriate evaluation and treatment of symptoms. Follow-up with your primary care provider for your medical issues, concerns and or health care needs.    

## 2021-02-25 NOTE — Consult Note (Signed)
bachTelepsych Consultation   Reason for Consult:  Psychiatric Reassessment Referring Physician:  Jeanella Flattery Location of Patient:    [facility] Location of Provider: Other: virtual home office  Patient Identification: Roberto Osborn MRN:  [id number] Principal Diagnosis: Adjustment disorder Diagnosis:  Principal Problem:   Adjustment disorder Active Problems:   Homelessness   Major depressive disorder   Total Time spent with patient: 30 minutes  Subjective:   Roberto Osborn is a 50 y.o. male patient who presents to the ED with depressed mood triggered by homelessness.  Psychiatry recommended observation and AM psych reassessment.  Patient seen via telepsych by this provider; chart reviewed and consulted with Dr. Lucianne Muss on 02/25/21.  On evaluation Roberto Osborn is sitting upright on the bed.  He is fairly groomed, makes good eye contact and agrees to speak with this Clinical research associate.  He is alert and oriented x4, clear and coherent communications.  He does not appear irritated and there is no indication of delusional thinking, nor is he responding to internal stimulus.    Patient states he is homeless, and not happy about his current situation but denies suicidal ideations, plan or intent for self harm.  He does say he is not sure how long he will be able to deal with the environmental exposure that comes with being homeless, "cold, people on the street picking at me because I'm homeless."    Patient was last seen at [facility] 10  years ago.  States he recently returned to [place] area 2 days ago looking for a place to stay.  States it's recently become harder to  locate homeless shelters.  Prior to arrival, states he was in [facility] area, historically lives in shelters and private homes.  He does not elaborate on why these living situations did not work out for him.     He denies hx for mental illness, although MDD is listed in earlier assessments.  He also denies  previous self harming behaviors or suicide attempts.  States he has never been inpatient for psychiatric concerns. He denies owning firearms or access to such.    HPI:  Per EDP Admission Note 02/24/2021: Chief Complaint  Patient presents with   Suicidal      Roberto Osborn is a 50 y.o. male with a history of tobacco use disorder, hypercholesteremia, diabetes mellitus who presents to the emergency department with a chief complaint of depressed mood.  The patient states that he has been living on the streets for "a long time".  He states " the environment is going to kill me.  I cannot stay out there anymore."  He denies suicidal ideation, but elaborates and states that if he continues to suffer from homelessness that he will die.  Previously, he endorsed auditory visual hallucinations to nursing staff, but that he denies these to me.  He also denies HI.   Patient is also endorsing redness of the bilateral hands and forearms that began after standing out in the sun for many hours.  No known aggravating or alleviating factors.  He denies chest pain, shortness of breath, fever, chills, red streaking, wounds, swelling, numbness, weakness, abdominal pain, vomiting, diarrhea.  No other complaints at this time.     The history is provided by the patient and medical records. No language interpreter was used.  Past Psychiatric History: MDD  Risk to Self:  no Risk to Others:  no Prior Inpatient Therapy:  no Prior Outpatient Therapy:  no  Past Medical History: History reviewed. No pertinent past medical history. History reviewed. No pertinent surgical history. Family History: No family history on file. Family Psychiatric  History: unknown Social History:  Social History   Substance and Sexual Activity  Alcohol Use None     Social History   Substance and Sexual Activity  Drug Use Not on file    Social History   Socioeconomic History   Marital status: Single    Spouse name: Not on file    Number of children: Not on file   Years of education: Not on file   Highest education level: Not on file  Occupational History   Not on file  Tobacco Use   Smoking status: Not on file   Smokeless tobacco: Not on file  Substance and Sexual Activity   Alcohol use: Not on file   Drug use: Not on file   Sexual activity: Not on file  Other Topics Concern   Not on file  Social History Narrative   Not on file   Social Determinants of Health   Financial Resource Strain: Not on file  Food Insecurity: Not on file  Transportation Needs: Not on file  Physical Activity: Not on file  Stress: Not on file  Social Connections: Not on file   Additional Social History:    Allergies:  No Known Allergies  Labs:  Results for orders placed or performed during the hospital encounter of 02/24/21 (from the past 48 hour(s))  Resp Panel by RT-PCR (Flu A&B, Covid) Nasopharyngeal Swab     Status: None   Collection Time: 02/24/21  9:12 PM   Specimen: Nasopharyngeal Swab; Nasopharyngeal(NP) swabs in vial transport medium  Result Value Ref Range   SARS Coronavirus 2 by RT PCR NEGATIVE NEGATIVE    Comment: (NOTE) SARS-CoV-2 target nucleic acids are NOT DETECTED.  The SARS-CoV-2 RNA is generally detectable in upper respiratory specimens during the acute phase of infection. The lowest concentration of SARS-CoV-2 viral copies this assay can detect is 138 copies/mL. A negative result does not preclude SARS-Cov-2 infection and should not be used as the sole basis for treatment or other patient management decisions. A negative result may occur with  improper specimen collection/handling, submission of specimen other than nasopharyngeal swab, presence of viral mutation(s) within the areas targeted by this assay, and inadequate number of viral copies(<138 copies/mL). A negative result must be combined with clinical observations, patient history, and epidemiological information. The expected result is  Negative.  Fact Sheet for Patients:  BloggerCourse.com  Fact Sheet for Healthcare Providers:  SeriousBroker.it  This test is no t yet approved or cleared by the [place] FDA and  has been authorized for detection and/or diagnosis of SARS-CoV-2 by FDA under an Emergency Use Authorization (EUA). This EUA will remain  in effect (meaning this test can be used) for the duration of the COVID-19 declaration under Section 564(b)(1) of the Act, 21 U.S.C.section 360bbb-3(b)(1), unless the authorization is terminated  or revoked sooner.       Influenza A by PCR NEGATIVE NEGATIVE   Influenza B by PCR NEGATIVE NEGATIVE    Comment: (NOTE) The Xpert Xpress SARS-CoV-2/FLU/RSV plus assay is intended as an aid in the diagnosis of influenza from Nasopharyngeal swab specimens and should not be used as a sole basis for treatment. Nasal washings and aspirates are unacceptable for Xpert Xpress SARS-CoV-2/FLU/RSV testing.  Fact Sheet for Patients: BloggerCourse.com  Fact Sheet for Healthcare Providers: SeriousBroker.it  This test is not yet  approved or cleared by the [place] and has been authorized for detection and/or diagnosis of SARS-CoV-2 by FDA under an Emergency Use Authorization (EUA). This EUA will remain in effect (meaning this test can be used) for the duration of the COVID-19 declaration under Section 564(b)(1) of the Act, 21 U.S.C. section 360bbb-3(b)(1), unless the authorization is terminated or revoked.  Performed at [facility], [address], [place], [place] [address]   Comprehensive metabolic panel     Status: Abnormal   Collection Time: 02/24/21  9:20 PM  Result Value Ref Range   Sodium 137 135 - 145 mmol/L   Potassium 4.1 3.5 - 5.1 mmol/L   Chloride 105 98 - 111 mmol/L   CO2 25 22 - 32 mmol/L   Glucose, Bld 107 (H) 70 - 99 mg/dL    Comment: Glucose  reference range applies only to samples taken after fasting for at least 8 hours.   BUN 12 6 - 20 mg/dL   Creatinine, Ser 6.04 0.61 - 1.24 mg/dL   Calcium 9.5 8.9 - 54.0 mg/dL   Total Protein 6.1 (L) 6.5 - 8.1 g/dL   Albumin 3.9 3.5 - 5.0 g/dL   AST 23 15 - 41 U/L   ALT 21 0 - 44 U/L   Alkaline Phosphatase 53 38 - 126 U/L   Total Bilirubin 0.5 0.3 - 1.2 mg/dL   GFR, Estimated >98 >11 mL/min    Comment: (NOTE) Calculated using the CKD-EPI Creatinine Equation (2021)    Anion gap 7 5 - 15    Comment: Performed at [facility], [address] [place], [place] [address]  Ethanol     Status: None   Collection Time: 02/24/21  9:20 PM  Result Value Ref Range   Alcohol, Ethyl (B) <10 <10 mg/dL    Comment: (NOTE) Lowest detectable limit for serum alcohol is 10 mg/dL.  For medical purposes only. Performed at [facility], [address] [place], [place] [address]   Salicylate level     Status: Abnormal   Collection Time: 02/24/21  9:20 PM  Result Value Ref Range   Salicylate Lvl <7.0 (L) 7.0 - 30.0 mg/dL    Comment: Performed at [facility], [address], [place], [place] [address]  Acetaminophen level     Status: Abnormal   Collection Time: 02/24/21  9:20 PM  Result Value Ref Range   Acetaminophen (Tylenol), Serum <10 (L) 10 - 30 ug/mL    Comment: (NOTE) Therapeutic concentrations vary significantly. A range of 10-30 ug/mL  may be an effective concentration for many patients. However, some  are best treated at concentrations outside of this range. Acetaminophen concentrations >150 ug/mL at 4 hours after ingestion  and >50 ug/mL at 12 hours after ingestion are often associated with  toxic reactions.  Performed at [facility], [address] [place], [place] [address]   cbc     Status: None   Collection Time: 02/24/21  9:20 PM  Result Value Ref Range   WBC 10.4 4.0 - 10.5 K/uL   RBC 4.79 4.22 - 5.81 MIL/uL   Hemoglobin 14.2 13.0 - 17.0 g/dL    HCT 96.2 95.2 - 84.1 %   MCV 88.9 80.0 - 100.0 fL   MCH 29.6 26.0 - 34.0 pg   MCHC 33.3 30.0 - 36.0 g/dL   RDW 32.4 40.1 - 02.7 %   Platelets 172 150 - 400 K/uL   nRBC  0.0 0.0 - 0.2 %    Comment: Performed at [facility], [address], [place], [place] [address]  Rapid urine drug screen (hospital performed)     Status: None   Collection Time: 02/25/21  6:49 AM  Result Value Ref Range   Opiates NONE DETECTED NONE DETECTED   Cocaine NONE DETECTED NONE DETECTED   Benzodiazepines NONE DETECTED NONE DETECTED   Amphetamines NONE DETECTED NONE DETECTED   Tetrahydrocannabinol NONE DETECTED NONE DETECTED   Barbiturates NONE DETECTED NONE DETECTED    Comment: (NOTE) DRUG SCREEN FOR MEDICAL PURPOSES ONLY.  IF CONFIRMATION IS NEEDED FOR ANY PURPOSE, NOTIFY LAB WITHIN 5 DAYS.  LOWEST DETECTABLE LIMITS FOR URINE DRUG SCREEN Drug Class                     Cutoff (ng/mL) Amphetamine and metabolites    1000 Barbiturate and metabolites    200 Benzodiazepine                 200 Tricyclics and metabolites     300 Opiates and metabolites        300 Cocaine and metabolites        300 THC                            50 Performed at [facility], [address] [place], [place] [address]     Medications:  Current Facility-Administered Medications  Medication Dose Route Frequency Provider Last Rate Last Admin   acetaminophen (TYLENOL) tablet 650 mg  650 mg Oral Q4H PRN McDonald, Mia A, PA-C   650 mg at 02/25/21 1412   alum & mag hydroxide-simeth (MAALOX/MYLANTA) 200-200-20 MG/5ML suspension 30 mL  30 mL Oral Q6H PRN McDonald, Mia A, PA-C       lubriderm seriously sensitive lotion   Topical BID McDonald, Mia A, PA-C   Given at 02/25/21 0952   nicotine (NICODERM CQ - dosed in mg/24 hours) patch 21 mg  21 mg Transdermal Daily McDonald, Mia A, PA-C   21 mg at 02/25/21 0952   ondansetron (ZOFRAN) tablet 4 mg  4 mg Oral Q8H PRN McDonald, Mia A, PA-C       No current outpatient  medications on file.    Musculoskeletal: Strength & Muscle Tone: within normal limits Gait & Station: normal Patient leans: N/A  Psychiatric Specialty Exam:  Presentation  General Appearance: Casual  Eye Contact:Good  Speech:Clear and Coherent; Normal Rate  Speech Volume:Normal  Handedness:Right   Mood and Affect  Mood:Anxious  Affect:Appropriate   Thought Process  Thought Processes:No data recorded Descriptions of Associations:Intact  Orientation:Full (Time, Place and Person)  Thought Content:Logical  History of Schizophrenia/Schizoaffective disorder:No data recorded Duration of Psychotic Symptoms:No data recorded Hallucinations:Hallucinations: None  Ideas of Reference:None  Suicidal Thoughts:Suicidal Thoughts: No  Homicidal Thoughts:Homicidal Thoughts: No   Sensorium  Memory:Immediate Good; Recent Good; Remote Good  Judgment:Good  Insight:Fair   Executive Functions  Concentration:Good  Attention Span:Good  Recall:Good  Fund of Knowledge:Good  Language:Good   Psychomotor Activity  Psychomotor Activity:Psychomotor Activity: Normal   Assets  Assets:Communication Skills; Desire for Improvement   Sleep  Sleep:Sleep: Good Number of Hours of Sleep: 8    Physical Exam: Physical Exam Constitutional:      Appearance: Normal appearance.  HENT:     Head: Normocephalic.  Cardiovascular:     Rate and Rhythm: Normal rate.     Pulses:  Normal pulses.  Pulmonary:     Effort: Pulmonary effort is normal.  Musculoskeletal:        General: Normal range of motion.     Cervical back: Normal range of motion.  Neurological:     General: No focal deficit present.     Mental Status: He is alert and oriented to person, place, and time.  Psychiatric:        Attention and Perception: Attention and perception normal.        Mood and Affect: Mood is anxious.        Speech: Speech normal.        Behavior: Behavior normal. Behavior is cooperative.         Thought Content: Thought content does not include suicidal ideation. Thought content does not include suicidal plan.        Cognition and Memory: Cognition and memory normal.        Judgment: Judgment normal.   Review of Systems  Constitutional: Negative.   HENT: Negative.    Eyes: Negative.   Respiratory: Negative.    Cardiovascular: Negative.   Gastrointestinal: Negative.   Genitourinary: Negative.   Musculoskeletal: Negative.   Skin: Negative.   Neurological: Negative.   Endo/Heme/Allergies: Negative.   Psychiatric/Behavioral:  Negative for hallucinations and suicidal ideas. The patient is nervous/anxious.   Blood pressure (!) 160/94, pulse (!) 119, temperature 97.6 F (36.4 C), temperature source Oral, resp. rate 17, height 6\' 1"  (1.854 m), weight 90.7 kg, SpO2 100 %. Body mass index is 26.39 kg/m.  Treatment Plan Summary: Plan- As per above assessment, there are no current grounds for involuntary commitment at this time.  Patient is homeless and tells me this is his primary concern today.  He has a hx for MDD but denies SI, plan or intent today.  Have asked SW to provide homeless resources; as well as information to follow-up with [facility] for mental health services.  Above discussed with patient concordance.   Disposition: No evidence of imminent risk to self or others at present.   Patient does not meet criteria for psychiatric inpatient admission. Supportive therapy provided about ongoing stressors. Discussed crisis plan, support from social network, calling 911, coming to the Emergency Department, and calling Suicide Hotline.  This service was provided via telemedicine using a 2-way, interactive audio and video technology.  Names of all persons participating in this telemedicine service and their role in this encounter. Name: Role: Patient  Name: Delray Alt Role: PMHNP    Ophelia Shoulder, NP 02/25/2021 3:42 PM

## 2021-02-25 NOTE — Discharge Instructions (Signed)
For your behavioral health needs you are advised to follow up with [facility] at your earliest opportunity: °     [facility] °     [address] °     [place], [place] [address] °     [phone] °     They offer psychiatry/medication management and therapy.  New patients are seen in their walk-in clinic.  Walk-in hours are Monday - Thursday from 8:00 am - 11:00 am for psychiatry, and Friday from 1:00 pm - 4:00 pm for therapy.  Walk-in patients are seen on a first come, first served basis, so try to arrive as early as possible for the best chance of being seen the same day.  Please note that to be eligible for services you must bring an ID or a piece of mail with your name and a [place] address. ° °

## 2021-02-25 NOTE — ED Notes (Signed)
Pt to purple with sitter to take a shower

## 2021-02-25 NOTE — BH Assessment (Signed)
[facility] Assessment Progress Note   Per Ophelia Shoulder, NP, this pt does not require psychiatric hospitalization at this time.  Pt is psychiatrically cleared.  Discharge instructions include referral information for [facility]  EDP Margarita Grizzle, MD and pt's nurse, Rosanne Sack, have been notified.  Doylene Canning, MA Triage Specialist [phone]
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
## 2021-02-25 NOTE — ED Provider Notes (Signed)
Emergency Medicine Observation Re-evaluation Note  Roberto Osborn is a 50 y.o. male, seen on rounds today.  Pt initially presented to the ED for complaints of Suicidal Currently, the patient is stable and cleared for d/c by psych.  Physical Exam  BP (!) 141/93 (BP Location: Right Arm)   Pulse 90   Temp 97.9 F (36.6 C) (Oral)   Resp 17   Ht 1.854 m (6\' 1" )   Wt 90.7 kg   SpO2 100%   BMI 26.39 kg/m  Physical Exam General: wdwn Cardiac: rrr Lungs: normal  Psych: depressed, no si  ED Course / MDM  EKG:   I have reviewed the labs performed to date as well as medications administered while in observation.  Recent changes in the last 24 hours include seen by bhh and cleared for d/c.  Plan  Current plan is for discharge with resources.  is not under involuntary commitment.  Reviewed bhh assessment note now at 31 after RN notified me of plan. Reviewed note from [facility] now and not notified previously   [facility], MD 02/25/21 1359

## 2021-02-25 NOTE — ED Provider Notes (Signed)
[facility] EMERGENCY DEPARTMENT Provider Note   CSN: [id number] Arrival date & time: 02/24/21  1905     History Chief Complaint  Patient presents with   Suicidal    BRANTLY KALMAN is a 50 y.o. male with a history of tobacco use disorder, hypercholesteremia, diabetes mellitus who presents to the emergency department with a chief complaint of depressed mood.  The patient states that he has been living on the streets for "a long time".  He states " the environment is going to kill me.  I cannot stay out there anymore."  He denies suicidal ideation, but elaborates and states that if he continues to suffer from homelessness that he will die.  Previously, he endorsed auditory visual hallucinations to nursing staff, but that he denies these to me.  He also denies HI.  Patient is also endorsing redness of the bilateral hands and forearms that began after standing out in the sun for many hours.  No known aggravating or alleviating factors.  He denies chest pain, shortness of breath, fever, chills, red streaking, wounds, swelling, numbness, weakness, abdominal pain, vomiting, diarrhea.  No other complaints at this time.   The history is provided by the patient and medical records. No language interpreter was used.      History reviewed. No pertinent past medical history.  There are no problems to display for this patient.   History reviewed. No pertinent surgical history.     No family history on file.     Home Medications Prior to Admission medications   Not on File    Allergies    Patient has no known allergies.  Review of Systems   Review of Systems  Constitutional:  Negative for appetite change and fever.  HENT:  Negative for congestion, sore throat and voice change.   Eyes:  Negative for visual disturbance.  Respiratory:  Negative for shortness of breath.   Cardiovascular:  Negative for chest pain.  Gastrointestinal:  Negative for abdominal pain,  constipation, diarrhea, nausea and vomiting.  Genitourinary:  Negative for dysuria.  Musculoskeletal:  Negative for back pain, myalgias, neck pain and neck stiffness.  Skin:  Positive for rash. Negative for wound.  Allergic/Immunologic: Negative for immunocompromised state.  Neurological:  Negative for dizziness, seizures, syncope, weakness, light-headedness, numbness and headaches.  Psychiatric/Behavioral:  Negative for confusion.        Depressed mood   Physical Exam Updated Vital Signs BP (!) 143/99 (BP Location: Right Arm)   Pulse 96   Temp 98.2 F (36.8 C) (Oral)   Resp 20   SpO2 97%   Physical Exam Vitals and nursing note reviewed.  Constitutional:      Appearance: He is well-developed.     Comments: Disheveled  HENT:     Head: Normocephalic.  Eyes:     Conjunctiva/sclera: Conjunctivae normal.  Cardiovascular:     Rate and Rhythm: Normal rate and regular rhythm.     Pulses: Normal pulses.     Heart sounds: Normal heart sounds. No murmur heard.   No friction rub. No gallop.  Pulmonary:     Effort: Pulmonary effort is normal. No respiratory distress.     Breath sounds: No stridor. No wheezing, rhonchi or rales.  Chest:     Chest wall: No tenderness.  Abdominal:     General: There is no distension.     Palpations: Abdomen is soft.  Musculoskeletal:     Cervical back: Neck supple.  Skin:  General: Skin is warm and dry.     Comments: First-degree sunburns noted to the bilateral hands and forearms.  Neurological:     Mental Status: He is alert.  Psychiatric:        Attention and Perception: He does not perceive auditory or visual hallucinations.        Mood and Affect: Affect is blunt.        Behavior: Behavior is cooperative.        Thought Content: Thought content does not include homicidal or suicidal ideation. Thought content does not include homicidal or suicidal plan.    ED Results / Procedures / Treatments   Labs (all labs ordered are listed, but only  abnormal results are displayed) Labs Reviewed  COMPREHENSIVE METABOLIC PANEL - Abnormal; Notable for the following components:      Result Value   Glucose, Bld 107 (*)    Total Protein 6.1 (*)    All other components within normal limits  SALICYLATE LEVEL - Abnormal; Notable for the following components:   Salicylate Lvl <7.0 (*)    All other components within normal limits  ACETAMINOPHEN LEVEL - Abnormal; Notable for the following components:   Acetaminophen (Tylenol), Serum <10 (*)    All other components within normal limits  RESP PANEL BY RT-PCR (FLU A&B, COVID) ARPGX2  ETHANOL  CBC  RAPID URINE DRUG SCREEN, HOSP PERFORMED    EKG None  Radiology No results found.  Procedures Procedures   Medications Ordered in ED Medications - No data to display  ED Course  I have reviewed the triage vital signs and the nursing notes.  Pertinent labs & imaging results that were available during my care of the patient were reviewed by me and considered in my medical decision making (see chart for details).    MDM Rules/Calculators/A&P                           50 year old male with a history of tobacco use disorder, hypercholesteremia, and diabetes mellitus who presents the emergency departments with depressed mood.  He stated to triage that he was suicidal with AVH. He denies these complaints to me, but states that he is depressed about his longstanding history of homelessness.  He also presented with a rash to his bilateral hands, but on exam it appears consistent with a first-degree sunburn.  He has no other complaints at this time.  Vital signs are stable.  Labs have been reviewed and independently interpreted by me.  Ethanol level is not elevated.  Salicylate level is normal.  No metabolic derangements.  CBC is unremarkable.  Pt medically cleared at this time. He remains VOLUNTARY. Psych hold orders and home med orders placed. TTS consult pending; please see psych team notes for  further documentation of care/dispo. Pt stable at time of med clearance.     Final Clinical Impression(s) / ED Diagnoses Final diagnoses:  None    Rx / DC Orders ED Discharge Orders     None        Brandom Kerwin A, PA-C 02/25/21 0422    Melene Plan, DO 02/25/21 [id number]

## 2021-02-25 NOTE — Discharge Summary (Signed)
[name] to be D/C'd Home per NP order. Discussed with the patient and all questions fully answered. An After Visit Summary was printed and given to the patient. Patient escorted out and D/C home via private auto.  Dickie La  02/25/2021 6:11 PM

## 2021-02-26 ENCOUNTER — Telehealth (HOSPITAL_COMMUNITY): Payer: Self-pay

## 2021-02-26 NOTE — BH Assessment (Signed)
Care Management - Follow Up [facility] Discharges   Writer attempted to make contact with patient today and was unsuccessful.  Patient phone just rang and the voicemail is not set up.   Per chart review, patient was provided outpatient resources.

## 2021-03-06 ENCOUNTER — Other Ambulatory Visit: Payer: Self-pay

## 2021-03-06 ENCOUNTER — Encounter (HOSPITAL_BASED_OUTPATIENT_CLINIC_OR_DEPARTMENT_OTHER): Payer: Self-pay

## 2021-03-06 ENCOUNTER — Emergency Department (HOSPITAL_BASED_OUTPATIENT_CLINIC_OR_DEPARTMENT_OTHER)
Admission: EM | Admit: 2021-03-06 | Discharge: 2021-03-06 | Disposition: A | Discharge status: Home / Self Care | Payer: Medicare PPO | Attending: Emergency Medicine | Admitting: Emergency Medicine

## 2021-03-06 DIAGNOSIS — G8929 Other chronic pain: Secondary | ICD-10-CM | POA: Diagnosis not present

## 2021-03-06 DIAGNOSIS — Z5900 Homelessness unspecified: Secondary | ICD-10-CM | POA: Insufficient documentation

## 2021-03-06 MED ORDER — ACETAMINOPHEN 325 MG PO TABS
ORAL_TABLET | ORAL | Status: AC
  Filled 2021-03-06: qty 2

## 2021-03-06 MED ORDER — ACETAMINOPHEN 325 MG PO TABS
650.0000 mg | ORAL_TABLET | Freq: Once | ORAL | Status: AC
  Administered 2021-03-06: 650 mg via ORAL
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
## 2021-03-06 NOTE — ED Notes (Signed)
Dr Allen in to see pt.

## 2021-03-06 NOTE — ED Triage Notes (Addendum)
Pt states he is homeless, has no where to go, goes to different places top get showers and food, stays a few days and then leaves d/t no beds available. He states the beds they give him are usually "too small" pt also requesting medication for pain and stinging to bilateral hands. Pt saying other peoples names in triage Pt having conversations with himself   Pt denies HI/SI

## 2021-03-06 NOTE — ED Provider Notes (Signed)
[facility] EMERGENCY DEPT Provider Note   CSN: [id number] Arrival date & time: 03/06/21  1213     History Chief Complaint  Patient presents with   Medical Clearance    Roberto Osborn is a 50 y.o. male.  50 year old male presents with multiple complaints.  His biggest 1 is that he is homeless.  He has chronic pain that he is currently not treating.  Does not have a PCP at this time.  States that the homeless shelters are full it is cold outside and he has nowhere to go.  Denies any SI or HI      History reviewed. No pertinent past medical history.  Patient Active Problem List   Diagnosis Date Noted   Adjustment disorder 02/25/2021   Homelessness 02/25/2021   Major depressive disorder 02/25/2021    No past surgical history on file.     No family history on file.     Home Medications Prior to Admission medications   Not on File    Allergies    Patient has no known allergies.  Review of Systems   Review of Systems  All other systems reviewed and are negative.  Physical Exam Updated Vital Signs BP (!) 131/95 (BP Location: Right Arm)   Pulse (!) 117   Temp 98.4 F (36.9 C) (Oral)   Resp 16   SpO2 100%   Physical Exam Vitals and nursing note reviewed.  Constitutional:      General: He is not in acute distress.    Appearance: Normal appearance. He is well-developed. He is not toxic-appearing.  HENT:     Head: Normocephalic and atraumatic.  Eyes:     General: Lids are normal.     Conjunctiva/sclera: Conjunctivae normal.     Pupils: Pupils are equal, round, and reactive to light.  Neck:     Thyroid: No thyroid mass.     Trachea: No tracheal deviation.  Cardiovascular:     Rate and Rhythm: Normal rate and regular rhythm.     Heart sounds: Normal heart sounds. No murmur heard.   No gallop.  Pulmonary:     Effort: Pulmonary effort is normal. No respiratory distress.     Breath sounds: Normal breath sounds. No stridor. No decreased  breath sounds, wheezing, rhonchi or rales.  Abdominal:     General: There is no distension.     Palpations: Abdomen is soft.     Tenderness: There is no abdominal tenderness. There is no rebound.  Musculoskeletal:        General: No tenderness. Normal range of motion.     Cervical back: Normal range of motion and neck supple.  Skin:    General: Skin is warm and dry.     Findings: No abrasion or rash.  Neurological:     Mental Status: He is alert and oriented to person, place, and time. Mental status is at baseline.     GCS: GCS eye subscore is 4. GCS verbal subscore is 5. GCS motor subscore is 6.     Cranial Nerves: No cranial nerve deficit.     Sensory: No sensory deficit.     Motor: Motor function is intact.  Psychiatric:        Attention and Perception: Attention normal.        Speech: Speech normal.        Behavior: Behavior normal.    ED Results / Procedures / Treatments   Labs (all labs ordered are listed, but only abnormal  results are displayed) Labs Reviewed  COMPREHENSIVE METABOLIC PANEL  ETHANOL  CBC  RAPID URINE DRUG SCREEN, HOSP PERFORMED    EKG None  Radiology No results found.  Procedures Procedures   Medications Ordered in ED Medications - No data to display  ED Course  I have reviewed the triage vital signs and the nursing notes.  Pertinent labs & imaging results that were available during my care of the patient were reviewed by me and considered in my medical decision making (see chart for details).    MDM Rules/Calculators/A&P                           Patient given Tylenol here.  Will give referral to homeless shelters Final Clinical Impression(s) / ED Diagnoses Final diagnoses:  None    Rx / DC Orders ED Discharge Orders     None        Lorre Nick, MD 03/06/21 1310

## 2021-03-11 ENCOUNTER — Ambulatory Visit (HOSPITAL_COMMUNITY)
Admission: EM | Admit: 2021-03-11 | Discharge: 2021-03-12 | Disposition: A | Discharge status: Home / Self Care | Payer: Medicare PPO | Attending: Psychiatry | Admitting: Psychiatry

## 2021-03-11 ENCOUNTER — Encounter (HOSPITAL_COMMUNITY): Payer: Self-pay | Admitting: Emergency Medicine

## 2021-03-11 DIAGNOSIS — F333 Major depressive disorder, recurrent, severe with psychotic symptoms: Secondary | ICD-10-CM | POA: Insufficient documentation

## 2021-03-11 DIAGNOSIS — R45851 Suicidal ideations: Secondary | ICD-10-CM | POA: Insufficient documentation

## 2021-03-11 DIAGNOSIS — F17210 Nicotine dependence, cigarettes, uncomplicated: Secondary | ICD-10-CM | POA: Diagnosis not present

## 2021-03-11 DIAGNOSIS — Z765 Malingerer [conscious simulation]: Secondary | ICD-10-CM | POA: Insufficient documentation

## 2021-03-11 DIAGNOSIS — Z20822 Contact with and (suspected) exposure to covid-19: Secondary | ICD-10-CM | POA: Insufficient documentation

## 2021-03-11 DIAGNOSIS — Z5900 Homelessness unspecified: Secondary | ICD-10-CM | POA: Diagnosis not present

## 2021-03-11 LAB — CBC WITH DIFFERENTIAL/PLATELET
Abs Immature Granulocytes: 0.02 10*3/uL (ref 0.00–0.07)
Basophils Absolute: 0 10*3/uL (ref 0.0–0.1)
Basophils Relative: 1 %
Eosinophils Absolute: 0.1 10*3/uL (ref 0.0–0.5)
Eosinophils Relative: 1 %
HCT: 43.2 % (ref 39.0–52.0)
Hemoglobin: 14.1 g/dL (ref 13.0–17.0)
Immature Granulocytes: 0 %
Lymphocytes Relative: 25 %
Lymphs Abs: 1.9 10*3/uL (ref 0.7–4.0)
MCH: 29 pg (ref 26.0–34.0)
MCHC: 32.6 g/dL (ref 30.0–36.0)
MCV: 88.9 fL (ref 80.0–100.0)
Monocytes Absolute: 0.6 10*3/uL (ref 0.1–1.0)
Monocytes Relative: 7 %
Neutro Abs: 5.1 10*3/uL (ref 1.7–7.7)
Neutrophils Relative %: 66 %
Platelets: 216 10*3/uL (ref 150–400)
RBC: 4.86 MIL/uL (ref 4.22–5.81)
RDW: 14 % (ref 11.5–15.5)
WBC: 7.7 10*3/uL (ref 4.0–10.5)
nRBC: 0 % (ref 0.0–0.2)

## 2021-03-11 LAB — COMPREHENSIVE METABOLIC PANEL
ALT: 23 U/L (ref 0–44)
AST: 23 U/L (ref 15–41)
Albumin: 3.6 g/dL (ref 3.5–5.0)
Alkaline Phosphatase: 61 U/L (ref 38–126)
Anion gap: 9 (ref 5–15)
BUN: 8 mg/dL (ref 6–20)
CO2: 26 mmol/L (ref 22–32)
Calcium: 9.5 mg/dL (ref 8.9–10.3)
Chloride: 104 mmol/L (ref 98–111)
Creatinine, Ser: 0.79 mg/dL (ref 0.61–1.24)
GFR, Estimated: >60 mL/min (ref >60)
Glucose, Bld: 139 mg/dL — ABNORMAL HIGH (ref 70–99)
Potassium: 3.9 mmol/L (ref 3.5–5.1)
Sodium: 139 mmol/L (ref 135–145)
Total Bilirubin: 0.5 mg/dL (ref 0.3–1.2)
Total Protein: 5.8 g/dL — ABNORMAL LOW (ref 6.5–8.1)

## 2021-03-11 LAB — URINALYSIS, ROUTINE W REFLEX MICROSCOPIC
Bilirubin Urine: NEGATIVE
Glucose, UA: NEGATIVE mg/dL
Hgb urine dipstick: NEGATIVE
Ketones, ur: NEGATIVE mg/dL
Leukocytes,Ua: NEGATIVE
Nitrite: NEGATIVE
Protein, ur: NEGATIVE mg/dL
Specific Gravity, Urine: 1.006 (ref 1.005–1.030)
pH: 6 (ref 5.0–8.0)

## 2021-03-11 LAB — POCT URINE DRUG SCREEN - MANUAL ENTRY (I-SCREEN)
POC Amphetamine UR: NOT DETECTED
POC Buprenorphine (BUP): NOT DETECTED
POC Cocaine UR: NOT DETECTED
POC Marijuana UR: NOT DETECTED
POC Methadone UR: NOT DETECTED
POC Methamphetamine UR: NOT DETECTED
POC Morphine: NOT DETECTED
POC Oxazepam (BZO): NOT DETECTED
POC Oxycodone UR: NOT DETECTED
POC Secobarbital (BAR): NOT DETECTED

## 2021-03-11 LAB — LIPID PANEL
Cholesterol: 115 mg/dL (ref 0–200)
HDL: 55 mg/dL (ref >40)
LDL Cholesterol: 49 mg/dL (ref 0–99)
Total CHOL/HDL Ratio: 2.1 RATIO
Triglycerides: 57 mg/dL (ref <150)
VLDL: 11 mg/dL (ref 0–40)

## 2021-03-11 LAB — POC SARS CORONAVIRUS 2 AG -  ED: SARS Coronavirus 2 Ag: NEGATIVE

## 2021-03-11 LAB — RESP PANEL BY RT-PCR (FLU A&B, COVID) ARPGX2
Influenza A by PCR: NEGATIVE
Influenza B by PCR: NEGATIVE
SARS Coronavirus 2 by RT PCR: NEGATIVE

## 2021-03-11 LAB — TSH: TSH: 0.759 u[IU]/mL (ref 0.350–4.500)

## 2021-03-11 MED ORDER — MAGNESIUM HYDROXIDE 400 MG/5ML PO SUSP: 30.0000 mL | Freq: Every day | ORAL | Status: DC | PRN

## 2021-03-11 MED ORDER — TRAZODONE HCL 50 MG PO TABS
50.0000 mg | ORAL_TABLET | Freq: Every evening | ORAL | Status: DC | PRN
  Administered 2021-03-12: 50 mg via ORAL
  Filled 2021-03-11: qty 1

## 2021-03-11 MED ORDER — HYDROXYZINE HCL 25 MG PO TABS
50.0000 mg | ORAL_TABLET | Freq: Three times a day (TID) | ORAL | Status: DC | PRN
  Administered 2021-03-12: 50 mg via ORAL
  Filled 2021-03-11 (×2): qty 2

## 2021-03-11 MED ORDER — ACETAMINOPHEN 325 MG PO TABS
650.0000 mg | ORAL_TABLET | Freq: Four times a day (QID) | ORAL | Status: DC | PRN
  Filled 2021-03-11: qty 2

## 2021-03-11 MED ORDER — ALUM & MAG HYDROXIDE-SIMETH 200-200-20 MG/5ML PO SUSP: 30.0000 mL | ORAL | Status: DC | PRN

## 2021-03-11 NOTE — ED Notes (Signed)
Pt sleeping@this time. Breathing even and unlabored. Will continue to monitor for safety 

## 2021-03-11 NOTE — Progress Notes (Signed)
   03/11/21 1224  [facility] Triage Screening (Walk-ins at [facility] only)  How Did You Hear About [place]? Legal System  What Is the Reason for Your Visit/Call Today? Pt is a 50 yo male who was brought in by LE due to a call he was disturbing people outside businesses. [facility] stated they have gotten multiple calls about his pt over the last week. Complaints include pt walking around outside shirtless and "scaring people." Pt is homeless and says he is living outside somewhere near the businesses that are complaining. Pt endorsed SI with a plan to "crack my head open on the sidewalk." Pt pulled back his bangs exposing his forehead pointing to a scar where he stated he has intentionally hit his head injuring himself before. Pt denied HI, [facility] and drug/alcohol use. Pt stated he was hearing voices at the time of the assessment telling him to kill himself. Pt appeared to be responding to internal stimuli at that time. Pt stated he was paranoid also, stating that he believed people were following him and out to get him.  How Long Has This Been Causing You Problems? <Week  Have You Recently Had Any Thoughts About Hurting Yourself? Yes  How long ago did you have thoughts about hurting yourself? no details given  Are You Planning to Commit Suicide/Harm Yourself At This time? Yes  Have you Recently Had Thoughts About Hurting Someone [name]? No  Are You Planning To Harm Someone At This Time? No  Are you currently experiencing any auditory, visual or other hallucinations? Yes  Please explain the hallucinations you are currently experiencing: Pt stated he was hearing voices to kill himself.  Have You Used Any Alcohol or Drugs in the Past 24 Hours? No (Pt denies.)  Do you have any current medical co-morbidities that require immediate attention? No  Clinician description of patient physical appearance/behavior: Pt is irritable and appears anxious. Pt is dressed casually and appears to have inadequate grooming. Pt is cooperative but  paused several times during the assessment remaining silent until he was ready to re-engage. Pt was thought blocking and his thought processes seemed tangential. Pt's thoughts and words were disorganized. Pt was alert and seemed partially oriented to person, place and situation.  If access to [facility] was not available, would you have sought care in the Emergency Department? Yes  Determination of Need Urgent (48 hours)  Zasha Belleau T. Jimmye Norman, MS, [facility], [facility] Triage Specialist [facility]

## 2021-03-11 NOTE — ED Provider Notes (Signed)
Behavioral Health Admission H&P Roberto Osborn & OBS)  Date: 03/11/21 Patient Name: Roberto Osborn MRN: [id number] Chief Complaint: No chief complaint on file.     Diagnoses:  Final diagnoses:  Suicidal ideation    Roberto Osborn, 50 y.o., male patient presented to [facility] requesting to bathe. Patient stated "I will talk to you after I shower". [place] noted with suicidal ideations, stating "I'm going to kill myself". Patient stated "I'm sick and have manic depression". Roberto Osborn reports that he was diagnosed with depression 11 years ago. He reports that he does not have an established psychiatric provider and no current medication regimen. Patient seen face to face by this provider, Dr. Serafina Mitchell; and  chart reviewed on 03/11/21.  On evaluation Roberto Osborn is unable to verbalize a definite suicide plan. He reports homelessness and exposing himself in public.  During evaluation Roberto Osborn is standing; he is alert/oriented x 4; hyperactive but cooperative; depressed mood congruent with affect.  Patient is speaking in a clear tone with increased volume, and normal pace; with good eye contact.  His thought process is disorganized at times; patient stated my brother is dead and that's a picture of him on the wall (while pointing to the wall). There is no indication that he is currently responding to internal/external stimuli. Patient denied AVH. Patient denies homicidal ideation, psychosis, and paranoia.  Patient answered questions appropriately.     PHQ 2-9:   [facility] from 03/06/2021 in [facility] ED from 02/24/2021 in [place] Error: Q3, 4, or 5 should not be populated when Q2 is No Moderate Risk        Total Time spent with patient: 20 minutes  Musculoskeletal  Strength & Muscle Tone: within normal limits Gait & Station: normal Patient leans: N/A  Psychiatric Specialty Exam   Presentation General Appearance: Disheveled  Eye Contact:Good  Speech:Clear and Coherent  Speech Volume:Increased  Handedness:Right   Mood and Affect  Mood:Anxious; Depressed  Affect:Congruent   Thought Process  Thought Processes:Disorganized  Descriptions of Associations:Tangential  Orientation:Full (Time, Place and Person)  Thought Content:Tangential    Hallucinations:Hallucinations: None  Ideas of Reference:None  Suicidal Thoughts:Suicidal Thoughts: Yes, Active SI Active Intent and/or Plan: With Intent; Without Plan  Homicidal Thoughts:Homicidal Thoughts: No   Sensorium  Memory:Immediate Good; Recent Good  Judgment:Poor  Insight:Fair   Executive Functions  Concentration:Good  Attention Span:Good  [place] of Knowledge:Good  Language:Good   Psychomotor Activity  Psychomotor Activity:Psychomotor Activity: Normal   Assets  Assets:Communication Skills; Desire for Improvement   Sleep  Sleep:Sleep: Good   Nutritional Assessment (For OBS and FBC admissions only) Has the patient had a weight loss or gain of 10 pounds or more in the last 3 months?: No Has the patient had a decrease in food intake/or appetite?: No Does the patient have dental problems?: No Does the patient have eating habits or behaviors that may be indicators of an eating disorder including binging or inducing vomiting?: No Has the patient recently lost weight without trying?: 0 Has the patient been eating poorly because of a decreased appetite?: 0 Malnutrition Screening Tool Score: 0   Physical Exam Vitals and nursing note reviewed.  HENT:     Nose: Nose normal.  Pulmonary:     Effort: Pulmonary effort is normal.  Musculoskeletal:        General: Normal range of motion.  Skin:    General: Skin is warm and  dry.  Neurological:     Mental Status: He is alert and oriented to person, place, and time.  Psychiatric:        Attention and Perception: Attention  normal. He does not perceive auditory or visual hallucinations.        Mood and Affect: Mood is anxious and depressed.        Speech: Speech normal.        Behavior: Behavior is hyperactive.        Thought Content: Thought content does not include homicidal or suicidal ideation. Thought content does not include suicidal plan.        Cognition and Memory: Cognition and memory normal.        Judgment: Judgment is inappropriate.   Review of Systems  Cardiovascular:  Negative for chest pain.  Neurological:  Negative for dizziness.  Psychiatric/Behavioral:  Positive for depression and suicidal ideas. The patient is nervous/anxious.   All other systems reviewed and are negative.  Blood pressure (!) 142/77, pulse (!) 105, temperature 98.2 F (36.8 C), temperature source Oral, resp. rate 18, SpO2 99 %. There is no height or weight on file to calculate BMI.  Past Psychiatric History: Patient reports a diagnosis of depression 11 years ago. No current medication regimen.  Is the patient at risk to self? Yes  Has the patient been a risk to self in the past 6 months? Yes .    Has the patient been a risk to self within the distant past?  Unknown   Is the patient a risk to others? No   Has the patient been a risk to others in the past 6 months? No   Has the patient been a risk to others within the distant past? No   Past Medical History: No past medical history on file. No past surgical history on file.  Family History: No family history on file.  Social History:  Social History   Socioeconomic History   Marital status: Single    Spouse name: Not on file   Number of children: Not on file   Years of education: Not on file   Highest education level: Not on file  Occupational History   Not on file  Tobacco Use   Smoking status: Not on file   Smokeless tobacco: Not on file  Substance and Sexual Activity   Alcohol use: Not on file   Drug use: Not on file   Sexual activity: Not on file   Other Topics Concern   Not on file  Social History Narrative   Not on file   Social Determinants of Health   Financial Resource Strain: Not on file  Food Insecurity: Not on file  Transportation Needs: Not on file  Physical Activity: Not on file  Stress: Not on file  Social Connections: Not on file  Intimate Partner Violence: Not on file    SDOH:  SDOH Screenings   Alcohol Screen: Not on file  Depression (PHQ2-9): Not on file  Financial Resource Strain: Not on file  Food Insecurity: Not on file  Housing: Not on file  Physical Activity: Not on file  Social Connections: Not on file  Stress: Not on file  Tobacco Use: Not on file  Transportation Needs: Not on file    Last Labs:  Admission on 03/11/2021  Component Date Value Ref Range Status   SARS Coronavirus 2 Ag 03/11/2021 Negative  Negative Final   POC Amphetamine UR 03/11/2021 None Detected  NONE DETECTED (Cut Off  Level 1000 ng/mL) Final   POC Secobarbital (BAR) 03/11/2021 None Detected  NONE DETECTED (Cut Off Level 300 ng/mL) Final   POC Buprenorphine (BUP) 03/11/2021 None Detected  NONE DETECTED (Cut Off Level 10 ng/mL) Final   POC Oxazepam (BZO) 03/11/2021 None Detected  NONE DETECTED (Cut Off Level 300 ng/mL) Final   POC Cocaine UR 03/11/2021 None Detected  NONE DETECTED (Cut Off Level 300 ng/mL) Final   POC Methamphetamine UR 03/11/2021 None Detected  NONE DETECTED (Cut Off Level 1000 ng/mL) Final   POC Morphine 03/11/2021 None Detected  NONE DETECTED (Cut Off Level 300 ng/mL) Final   POC Oxycodone UR 03/11/2021 None Detected  NONE DETECTED (Cut Off Level 100 ng/mL) Final   POC Methadone UR 03/11/2021 None Detected  NONE DETECTED (Cut Off Level 300 ng/mL) Final   POC Marijuana UR 03/11/2021 None Detected  NONE DETECTED (Cut Off Level 50 ng/mL) Final  Admission on 02/24/2021, Discharged on 02/25/2021  Component Date Value Ref Range Status   Sodium 02/24/2021 137  135 - 145 mmol/L Final   Potassium 02/24/2021 4.1   3.5 - 5.1 mmol/L Final   Chloride 02/24/2021 105  98 - 111 mmol/L Final   CO2 02/24/2021 25  22 - 32 mmol/L Final   Glucose, Bld 02/24/2021 107 (A)  70 - 99 mg/dL Final   Glucose reference range applies only to samples taken after fasting for at least 8 hours.   BUN 02/24/2021 12  6 - 20 mg/dL Final   Creatinine, Ser 02/24/2021 0.81  0.61 - 1.24 mg/dL Final   Calcium 02/24/2021 9.5  8.9 - 10.3 mg/dL Final   Total Protein 02/24/2021 6.1 (A)  6.5 - 8.1 g/dL Final   Albumin 02/24/2021 3.9  3.5 - 5.0 g/dL Final   AST 02/24/2021 23  15 - 41 U/L Final   ALT 02/24/2021 21  0 - 44 U/L Final   Alkaline Phosphatase 02/24/2021 53  38 - 126 U/L Final   Total Bilirubin 02/24/2021 0.5  0.3 - 1.2 mg/dL Final   GFR, Estimated 02/24/2021 >60  >60 mL/min Final   Comment: (NOTE) Calculated using the CKD-EPI Creatinine Equation (2021)    Anion gap 02/24/2021 7  5 - 15 Final   Performed at [facility] [address], [place], [place] [address]   Alcohol, Ethyl (B) 02/24/2021 <10  <10 mg/dL Final   Comment: (NOTE) Lowest detectable limit for serum alcohol is 10 mg/dL.  For medical purposes only. Performed at [facility], [place] [address], [place], [place] [id number]    Salicylate Lvl A999333 <7.0 (A)  7.0 - 30.0 mg/dL Final   Performed at [facility] [address] [place], [place] [address]   Acetaminophen (Tylenol), Serum 02/24/2021 <10 (A)  10 - 30 ug/mL Final   Comment: (NOTE) Therapeutic concentrations vary significantly. A range of 10-30 ug/mL  may be an effective concentration for many patients. However, some  are best treated at concentrations outside of this range. Acetaminophen concentrations >150 ug/mL at 4 hours after ingestion  and >50 ug/mL at 12 hours after ingestion are often associated with  toxic reactions.  Performed at [facility], [place] [address], [place], [place] [address]    WBC 02/24/2021 10.4  4.0 - 10.5 K/uL Final   RBC 02/24/2021 4.79   4.22 - 5.81 MIL/uL Final   Hemoglobin 02/24/2021 14.2  13.0 - 17.0 g/dL Final   HCT 02/24/2021 42.6  39.0 - 52.0 %  Final   MCV 02/24/2021 88.9  80.0 - 100.0 fL Final   MCH 02/24/2021 29.6  26.0 - 34.0 pg Final   MCHC 02/24/2021 33.3  30.0 - 36.0 g/dL Final   RDW 72/01/4708 13.2  11.5 - 15.5 % Final   Platelets 02/24/2021 172  150 - 400 K/uL Final   nRBC 02/24/2021 0.0  0.0 - 0.2 % Final   Performed at [facility], [address], [place], [place] [address]   Opiates 02/25/2021 NONE DETECTED  NONE DETECTED Final   Cocaine 02/25/2021 NONE DETECTED  NONE DETECTED Final   Benzodiazepines 02/25/2021 NONE DETECTED  NONE DETECTED Final   Amphetamines 02/25/2021 NONE DETECTED  NONE DETECTED Final   Tetrahydrocannabinol 02/25/2021 NONE DETECTED  NONE DETECTED Final   Barbiturates 02/25/2021 NONE DETECTED  NONE DETECTED Final   Comment: (NOTE) DRUG SCREEN FOR MEDICAL PURPOSES ONLY.  IF CONFIRMATION IS NEEDED FOR ANY PURPOSE, NOTIFY LAB WITHIN 5 DAYS.  LOWEST DETECTABLE LIMITS FOR URINE DRUG SCREEN Drug Class                     Cutoff (ng/mL) Amphetamine and metabolites    1000 Barbiturate and metabolites    200 Benzodiazepine                 200 Tricyclics and metabolites     300 Opiates and metabolites        300 Cocaine and metabolites        300 THC                            50 Performed at [facility], [address], [place], [place] [address]    SARS Coronavirus 2 by RT PCR 02/24/2021 NEGATIVE  NEGATIVE Final   Comment: (NOTE) SARS-CoV-2 target nucleic acids are NOT DETECTED.  The SARS-CoV-2 RNA is generally detectable in upper respiratory specimens during the acute phase of infection. The lowest concentration of SARS-CoV-2 viral copies this assay can detect is 138 copies/mL. A negative result does not preclude SARS-Cov-2 infection and should not be used as the sole basis for treatment or other patient management decisions. A negative result may occur with   improper specimen collection/handling, submission of specimen other than nasopharyngeal swab, presence of viral mutation(s) within the areas targeted by this assay, and inadequate number of viral copies(<138 copies/mL). A negative result must be combined with clinical observations, patient history, and epidemiological information. The expected result is Negative.  Fact Sheet for Patients:  BloggerCourse.com  Fact Sheet for Healthcare Providers:  SeriousBroker.it  This test is no                          t yet approved or cleared by the [place] FDA and  has been authorized for detection and/or diagnosis of SARS-CoV-2 by FDA under an Emergency Use Authorization (EUA). This EUA will remain  in effect (meaning this test can be used) for the duration of the COVID-19 declaration under Section 564(b)(1) of the Act, 21 U.S.C.section 360bbb-3(b)(1), unless the authorization is terminated  or revoked sooner.       Influenza A by PCR 02/24/2021 NEGATIVE  NEGATIVE Final   Influenza B by PCR 02/24/2021 NEGATIVE  NEGATIVE Final   Comment: (NOTE) The Xpert Xpress SARS-CoV-2/FLU/RSV plus assay is intended as an aid in the diagnosis of influenza from Nasopharyngeal swab specimens and should  not be used as a sole basis for treatment. Nasal washings and aspirates are unacceptable for Xpert Xpress SARS-CoV-2/FLU/RSV testing.  Fact Sheet for Patients: EntrepreneurPulse.com.au  Fact Sheet for Healthcare Providers: IncredibleEmployment.be  This test is not yet approved or cleared by the [place] FDA and has been authorized for detection and/or diagnosis of SARS-CoV-2 by FDA under an Emergency Use Authorization (EUA). This EUA will remain in effect (meaning this test can be used) for the duration of the COVID-19 declaration under Section 564(b)(1) of the Act, 21 U.S.C. section 360bbb-3(b)(1), unless  the authorization is terminated or revoked.  Performed at [facility], [place] [address] [place], [place] [address]     Allergies: Patient has no known allergies.  PTA Medications: (Not in a [facility] admission)   Medical Decision Making  Patient will transfer to the observation unit for continuous monitoring to determine an appropriate level of care.    Recommendations  Based on my evaluation the patient does not appear to have an emergency medical condition.  Franne Grip, NP 03/11/21  5:46 PM

## 2021-03-11 NOTE — ED Notes (Signed)
Pt took shower, and is laying in his bed calm and cooperative. No c.o of pain or distress. Will continue to monitor for safety

## 2021-03-11 NOTE — ED Notes (Signed)
Pt is adm to obs unit.   Skin search performed prior to admission   No contraband noted.  PT is awake and alert x 4.  Has pressured affect and suspicious nature.  He took a shower and was given scrubs.  Oriented to milieu and layed down.  Will continue to monitor for safety.

## 2021-03-12 ENCOUNTER — Ambulatory Visit (INDEPENDENT_AMBULATORY_CARE_PROVIDER_SITE_OTHER)
Admission: EM | Admit: 2021-03-12 | Discharge: 2021-03-13 | Disposition: A | Discharge status: Home / Self Care | Payer: Medicare PPO | Source: Home / Self Care

## 2021-03-12 DIAGNOSIS — Z5900 Homelessness unspecified: Secondary | ICD-10-CM | POA: Insufficient documentation

## 2021-03-12 DIAGNOSIS — R45851 Suicidal ideations: Secondary | ICD-10-CM

## 2021-03-12 DIAGNOSIS — F333 Major depressive disorder, recurrent, severe with psychotic symptoms: Secondary | ICD-10-CM

## 2021-03-12 DIAGNOSIS — Z20822 Contact with and (suspected) exposure to covid-19: Secondary | ICD-10-CM | POA: Insufficient documentation

## 2021-03-12 DIAGNOSIS — F17210 Nicotine dependence, cigarettes, uncomplicated: Secondary | ICD-10-CM | POA: Insufficient documentation

## 2021-03-12 DIAGNOSIS — Z765 Malingerer [conscious simulation]: Secondary | ICD-10-CM

## 2021-03-12 LAB — POCT URINE DRUG SCREEN - MANUAL ENTRY (I-SCREEN)
POC Amphetamine UR: NOT DETECTED
POC Buprenorphine (BUP): NOT DETECTED
POC Cocaine UR: NOT DETECTED
POC Marijuana UR: NOT DETECTED
POC Methadone UR: NOT DETECTED
POC Methamphetamine UR: NOT DETECTED
POC Morphine: NOT DETECTED
POC Oxazepam (BZO): NOT DETECTED
POC Oxycodone UR: NOT DETECTED
POC Secobarbital (BAR): NOT DETECTED

## 2021-03-12 LAB — POC SARS CORONAVIRUS 2 AG -  ED: SARS Coronavirus 2 Ag: NEGATIVE

## 2021-03-12 MED ORDER — MAGNESIUM HYDROXIDE 400 MG/5ML PO SUSP: 30.0000 mL | Freq: Every day | ORAL | Status: DC | PRN

## 2021-03-12 MED ORDER — ALUM & MAG HYDROXIDE-SIMETH 200-200-20 MG/5ML PO SUSP: 30.0000 mL | ORAL | Status: DC | PRN

## 2021-03-12 MED ORDER — TRAZODONE HCL 50 MG PO TABS: 50.0000 mg | ORAL_TABLET | Freq: Every evening | ORAL | Status: DC | PRN

## 2021-03-12 MED ORDER — HYDROXYZINE HCL 25 MG PO TABS: 25.0000 mg | ORAL_TABLET | Freq: Three times a day (TID) | ORAL | Status: DC | PRN

## 2021-03-12 MED ORDER — ACETAMINOPHEN 325 MG PO TABS: 650.0000 mg | ORAL_TABLET | Freq: Four times a day (QID) | ORAL | Status: DC | PRN

## 2021-03-12 NOTE — ED Notes (Addendum)
Pt started escalating this morning.  Attempts to de-escalate were made. Including offering food and medication.  Pt became increasingly agitated.He was demanding more food and then stated he wanted to leave.   Pt was given breakfast cereal and juice and threw it into the wall. He spoke the the mid level provider and restated that he wanted to leave urgently.   He denied SI or HI and AVH to this Clinical research associate.    Pt was escorted off unit by security and  given back all his belongings including wet clothes (they had been in the process of being washed).  He refused the AVS.  He took his belongings and was escorted out the sally port .  No further incident noted.
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
## 2021-03-12 NOTE — ED Provider Notes (Signed)
FBC/OBS ASAP Discharge Summary  Date and Time: 03/12/2021 7:32 AM  Name: Roberto Osborn  MRN:  [id number]   Discharge Diagnoses:  Final diagnoses:  Suicidal ideation    Subjective: "I am ready to go. Let me out of here. I should have never come here."   Roberto Osborn, 50 y.o., male patient presented to [facility] requesting to bathe. Patient stated "I will talk to you after I shower". Tuan noted with suicidal ideations, stating "I'm going to kill myself". Patient stated "I'm sick and have manic depression". Roberto Osborn reports that he was diagnosed with depression 11 years ago. He reports that he does not have an established psychiatric provider and no current medication regimen. On evaluation Roberto Osborn is unable to verbalize a definite suicide plan. He reports homelessness and exposing himself in public.  Stay Summary: Patient seen and re-evaluated face-to-face by this provider, chart reviewed and case discussed with Dr. Lucianne Muss. On evaluation, patient is alert and oriented x3. His thought process is logical and speech is pressured, and loud. On approach, he is observed to be yelling "I am ready to go. Let me out of here. I should have never come here." He denies having thoughts of wanting to hurt himself or others. He denies hearing voices or seeing things that other people cannot hear or see. He does not objectively appear to be responding to internal or external stimuli. He is adamant about discharging now and refuses resources for shelters or outpatient follow-up.   Total Time spent with patient: 20 minutes  Past Psychiatric History: diagnosed with depression 11 years ago. Past Medical History: History reviewed. No pertinent past medical history. History reviewed. No pertinent surgical history. Family History: History reviewed. No pertinent family history. Family Psychiatric History: No reported family hx. Social History: He denies drinking alcohol or using illicit drugs. Social History    Substance and Sexual Activity  Alcohol Use None     Social History   Substance and Sexual Activity  Drug Use Not on file    Social History   Socioeconomic History   Marital status: Single    Spouse name: Not on file   Number of children: Not on file   Years of education: Not on file   Highest education level: Not on file  Occupational History   Not on file  Tobacco Use   Smoking status: Every Day    Types: Cigarettes   Smokeless tobacco: Not on file  Substance and Sexual Activity   Alcohol use: Not on file   Drug use: Not on file   Sexual activity: Not on file  Other Topics Concern   Not on file  Social History Narrative   Not on file   Social Determinants of Health   Financial Resource Strain: Not on file  Food Insecurity: Not on file  Transportation Needs: Not on file  Physical Activity: Not on file  Stress: Not on file  Social Connections: Not on file   SDOH:  SDOH Screenings   Alcohol Screen: Not on file  Depression (PHQ2-9): Not on file  Financial Resource Strain: Not on file  Food Insecurity: Not on file  Housing: Not on file  Physical Activity: Not on file  Social Connections: Not on file  Stress: Not on file  Tobacco Use: High Risk   Smoking Tobacco Use: Every Day   Smokeless Tobacco Use: Unknown   Passive Exposure: Not on file  Transportation Needs: Not on file    Tobacco Cessation:  N/A, patient does not currently use tobacco products  Current Medications:  Current Facility-Administered Medications  Medication Dose Route Frequency Provider Last Rate Last Admin   acetaminophen (TYLENOL) tablet 650 mg  650 mg Oral Q6H PRN Penn, Cicely, NP       alum & mag hydroxide-simeth (MAALOX/MYLANTA) 200-200-20 MG/5ML suspension 30 mL  30 mL Oral Q4H PRN Penn, Cicely, NP       hydrOXYzine (ATARAX/VISTARIL) tablet 50 mg  50 mg Oral TID PRN Mcneil Sober, NP   50 mg at 03/12/21 0017   magnesium hydroxide (MILK OF MAGNESIA) suspension 30 mL  30 mL Oral  Daily PRN Penn, Cranston Neighbor, NP       traZODone (DESYREL) tablet 50 mg  50 mg Oral QHS PRN Penn, Cranston Neighbor, NP   50 mg at 03/12/21 0017   No current outpatient medications on file.    PTA Medications: (Not in a hospital admission)   Musculoskeletal  Strength & Muscle Tone: within normal limits Gait & Station: normal Patient leans: N/A  Psychiatric Specialty Exam  Presentation  General Appearance: Disheveled  Eye Contact:Fair  Speech:Clear and Coherent  Speech Volume:Normal  Handedness:Right   Mood and Affect  Mood:Irritable  Affect:Congruent   Thought Process  Thought Processes:Coherent  Descriptions of Associations:Tangential  Orientation:Full (Time, Place and Person)  Thought Content:Logical     Hallucinations:Hallucinations: None  Ideas of Reference:None  Suicidal Thoughts:Suicidal Thoughts: No SI Active Intent and/or Plan: With Intent; Without Plan  Homicidal Thoughts:Homicidal Thoughts: No   Sensorium  Memory:Immediate Fair; Recent Fair; Remote Fair  Judgment:Intact  Insight:Present   Executive Functions  Concentration:Fair  Attention Span:Fair  Recall:Fair  Fund of Knowledge:Fair  Language:Fair   Psychomotor Activity  Psychomotor Activity:Psychomotor Activity: Normal   Assets  Assets:Communication Skills; Leisure Time; Physical Health   Sleep  Sleep:Sleep: Fair   Nutritional Assessment (For OBS and FBC admissions only) Has the patient had a weight loss or gain of 10 pounds or more in the last 3 months?: No Has the patient had a decrease in food intake/or appetite?: No Does the patient have dental problems?: No Does the patient have eating habits or behaviors that may be indicators of an eating disorder including binging or inducing vomiting?: No Has the patient recently lost weight without trying?: 0 Has the patient been eating poorly because of a decreased appetite?: 0 Malnutrition Screening Tool Score: 0    Physical Exam   Physical Exam HENT:     Head: Normocephalic.  Cardiovascular:     Rate and Rhythm: Normal rate.  Pulmonary:     Effort: Pulmonary effort is normal.  Musculoskeletal:     Cervical back: Normal range of motion.  Neurological:     Mental Status: He is alert and oriented to person, place, and time.   Review of Systems  Constitutional: Negative.   HENT: Negative.    Eyes: Negative.   Respiratory: Negative.    Cardiovascular: Negative.   Gastrointestinal: Negative.   Genitourinary: Negative.   Musculoskeletal: Negative.   Skin: Negative.   Neurological: Negative.   Endo/Heme/Allergies: Negative.   Blood pressure 127/66, pulse 95, temperature 98.7 F (37.1 C), temperature source Oral, resp. rate 20, SpO2 96 %. There is no height or weight on file to calculate BMI.  Demographic Factors:  Male and Caucasian  Loss Factors: Financial problems/change in socioeconomic status  Historical Factors: Impulsivity  Risk Reduction Factors:   Religious beliefs about death  Continued Clinical Symptoms:  Previous Psychiatric Diagnoses and Treatments  Cognitive Features  That Contribute To Risk:  None    Suicide Risk:  Minimal: No identifiable suicidal ideation.  Patients presenting with no risk factors but with morbid ruminations; may be classified as minimal risk based on the severity of the depressive symptoms  Plan Of Care/Follow-up recommendations:  Activity:  as tolerated  Disposition: Discharge to home. Patient declined resources for shelter and outpatient follow up with psychiatry. Patient was encouraged to follow up at the [facility] outpatient for mental health needs.    Follow-up Information     Go to  [facility].   Specialty: Urgent Care Why: Open access walk-in hours Monday - Thursday 8 am to 11 am for medication management and therapy. Contact information: [phone] [place] [address] [phone]                 Layla Barter, NP 03/12/2021, 7:32 AM

## 2021-03-12 NOTE — Discharge Instructions (Addendum)

## 2021-03-12 NOTE — Progress Notes (Signed)
   03/12/21 2146  [facility] Triage Screening (Walk-ins at [facility] only)  How Did You Hear About [place]? Self  What Is the Reason for Your Visit/Call Today? Pt was discharged from [facility] this morning. He says he had a conflict with someone about taking a shower but now he has showered in the rain. Pt appears to be thought blocking and responding to internal stimuli. His thought process is disorganized and he has some difficulty answering questions appropriately. He says he is experiencing auditory and visual hallucinations which he is unable to describe. He says he sometimes hears voices telling him to kill himself and acknowledges suicidal thought. He is unable to articulate a plan and yesterday told [facility] staff he would "crack his head open on the sidewalk." He denies homicidal ideation. He denies alcohol or other substance use. He says "I know I need help."  How Long Has This Been Causing You Problems? > than 6 months  Have You Recently Had Any Thoughts About Hurting Yourself? Yes  How long ago did you have thoughts about hurting yourself? No details given  Are You Planning to Commit Suicide/Harm Yourself At This time? Yes  Have you Recently Had Thoughts About Hurting Someone [name]? No  Are You Planning To Harm Someone At This Time? No  Are you currently experiencing any auditory, visual or other hallucinations? Yes  Please explain the hallucinations you are currently experiencing: Pt reports hearing and seeing things he cannot describe.  Have You Used Any Alcohol or Drugs in the Past 24 Hours? No  Do you have any current medical co-morbidities that require immediate attention? No  Clinician description of patient physical appearance/behavior: Pt appears somewhat disheveled. Pt is cooperative but paused several times during the assessment remaining silent until he was ready to re-engage. Pt was thought blocking and his thought processes seemed tangential. Pt's thoughts and words were disorganized. Pt was alert and  seemed partially oriented to person, place and situation.  What Do You Feel Would Help You the Most Today? Treatment for Depression or other mood problem;Housing Assistance;Medication(s)  If access to [facility] Urgent Care was not available, would you have sought care in the Emergency Department? Yes  Determination of Need Urgent (48 hours)  Options For Referral Inpatient Hospitalization;Medication Management;BH Urgent Care;Outpatient Therapy   Gave clinical report to [name], PA who will complete MSE.

## 2021-03-13 DIAGNOSIS — F333 Major depressive disorder, recurrent, severe with psychotic symptoms: Secondary | ICD-10-CM | POA: Diagnosis not present

## 2021-03-13 DIAGNOSIS — Z765 Malingerer [conscious simulation]: Secondary | ICD-10-CM

## 2021-03-13 LAB — RESP PANEL BY RT-PCR (FLU A&B, COVID) ARPGX2
Influenza A by PCR: NEGATIVE
Influenza B by PCR: NEGATIVE
SARS Coronavirus 2 by RT PCR: NEGATIVE

## 2021-03-13 NOTE — Discharge Instructions (Signed)
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
## 2021-03-13 NOTE — ED Provider Notes (Signed)
Behavioral Health Admission H&P [facility] & OBS)  Date: 03/13/21 Patient Name: Roberto Osborn MRN: [id number] Chief Complaint:  Chief Complaint  Patient presents with   Suicidal   Depression      Diagnoses:  Final diagnoses:  Severe episode of recurrent major depressive disorder, with psychotic features (HCC)    HPI:   Roberto Osborn is a 50 year old male with a past psychiatric history significant for depression who presents a walk-in to [facility] due to depression and suicidal ideations.  Patient was discharged earlier today after requesting to leave the facility.  Patient reports that he was here earlier today and states that people would not stop bothering him while he was in the bathroom.  Patient expressed that he got irritable during the interaction and made a fool of himself.  Patient is noted repeating himself throughout the encounter.  Patient endorses depression stating that he lost his blanket twice.  Patient endorses the following depressive symptoms: decreased energy, lack of motivation, feelings of guilt/worthlessness, and difficulty concentrating.  Patient reports that his clothes, socks, shoes are wet while his jacket is very dirty.  Patient also endorses anxiety.  Patient endorses suicidal ideations but is unable to verbalize a specific plan.  Patient denies homicidal ideations.  He endorses auditory and visual hallucinations stating that he hears and sees things.  Patient unable to specify what he sees or hears.  It is difficult to determine if patient is responding to internal/external stimuli but he does appear to be exhibiting bizarre behavior repeating comments over and over during the encounter.  Patient endorses poor sleep stating that he receives no sleep at all.  Patient endorses appetite but reports that he eats only when he is able to.  Patient denies alcohol consumption and illicit drug use.  Patient endorses tobacco use stating  that he smokes a lot of cigarettes.  PHQ 2-9:   Flowsheet Row ED from 03/12/2021 in [facility] ED from 03/06/2021 in [facility] ED from 02/24/2021 in [facility] EMERGENCY DEPARTMENT  C-SSRS RISK CATEGORY Moderate Risk Error: Q3, 4, or 5 should not be populated when Q2 is No Moderate Risk        Total Time spent with patient: 20 minutes  Musculoskeletal  Strength & Muscle Tone: within normal limits Gait & Station: normal Patient leans: N/A  Psychiatric Specialty Exam  Presentation General Appearance: Disheveled  Eye Contact:Fair  Speech:Garbled  Speech Volume:Normal  Handedness:Right   Mood and Affect  Mood:Irritable; Anxious  Affect:Congruent   Thought Process  Thought Processes:Disorganized; Irrevelant  Descriptions of Associations:Tangential  Orientation:Partial  Thought Content:Illogical; Scattered  Diagnosis of Schizophrenia or Schizoaffective disorder in past: Yes  Duration of Psychotic Symptoms: Greater than six months  Hallucinations:Hallucinations: Auditory; Visual Description of Auditory Hallucinations: Patient only reported hearing things Description of Visual Hallucinations: Patient only reported seeing things  Ideas of Reference:None  Suicidal Thoughts:Suicidal Thoughts: Yes, Passive SI Passive Intent and/or Plan: Without Intent; Without Plan  Homicidal Thoughts:Homicidal Thoughts: No   Sensorium  Memory:Immediate Fair; Recent Fair; Remote Fair  Judgment:Impaired  Insight:Shallow; Lacking   Executive Functions  Concentration:Fair  Attention Span:Poor  Recall:Fair  Fund of Knowledge:Fair  Language:Fair   Psychomotor Activity  Psychomotor Activity:Psychomotor Activity: Restlessness   Assets  Assets:Communication Skills; Physical Health   Sleep  Sleep:Sleep: Poor   Nutritional Assessment (For OBS and [facility]BC admissions only) Has the patient had a  weight loss or gain of 10  pounds or more in the last 3 months?: No Has the patient had a decrease in food intake/or appetite?: No Does the patient have dental problems?: No Does the patient have eating habits or behaviors that may be indicators of an eating disorder including binging or inducing vomiting?: No Has the patient recently lost weight without trying?: 0 Has the patient been eating poorly because of a decreased appetite?: 0 Malnutrition Screening Tool Score: 0   Physical Exam Psychiatric:        Attention and Perception: Attention normal. He perceives auditory and visual hallucinations.        Mood and Affect: Mood is anxious and depressed. Affect is inappropriate.        Speech: Speech is rapid and pressured, slurred and tangential.        Behavior: Behavior is cooperative.        Thought Content: Thought content includes suicidal ideation. Thought content does not include homicidal ideation. Thought content does not include suicidal plan.        Cognition and Memory: Cognition is impaired. Memory is impaired.        Judgment: Judgment is inappropriate.   Review of Systems  Psychiatric/Behavioral:  Positive for depression, hallucinations and suicidal ideas. Negative for substance abuse. The patient is nervous/anxious and has insomnia.    Blood pressure (!) 150/99, pulse 97, resp. rate 18, SpO2 100 %. There is no height or weight on file to calculate BMI.  Past Psychiatric History:  Patient diagnosed with depression 11 years ago  Is the patient at risk to self? Yes  Has the patient been a risk to self in the past 6 months? No .    Has the patient been a risk to self within the distant past? No   Is the patient a risk to others? Yes   Has the patient been a risk to others in the past 6 months? Unknown Has the patient been a risk to others within the distant past? Unknown  Past Medical History: No past medical history on file. No past surgical history on file.  Family  History: No family history on file.  Social History:  Social History   Socioeconomic History   Marital status: Single    Spouse name: Not on file   Number of children: Not on file   Years of education: Not on file   Highest education level: Not on file  Occupational History   Not on file  Tobacco Use   Smoking status: Every Day    Types: Cigarettes   Smokeless tobacco: Not on file  Substance and Sexual Activity   Alcohol use: Not on file   Drug use: Not on file   Sexual activity: Not on file  Other Topics Concern   Not on file  Social History Narrative   Not on file   Social Determinants of Health   Financial Resource Strain: Not on file  Food Insecurity: Not on file  Transportation Needs: Not on file  Physical Activity: Not on file  Stress: Not on file  Social Connections: Not on file  Intimate Partner Violence: Not on file    SDOH:  SDOH Screenings   Alcohol Screen: Not on file  Depression (ZOX0-9): Not on file  Financial Resource Strain: Not on file  Food Insecurity: Not on file  Housing: Not on file  Physical Activity: Not on file  Social Connections: Not on file  Stress: Not on file  Tobacco Use: High Risk   Smoking  Tobacco Use: Every Day   Smokeless Tobacco Use: Unknown   Passive Exposure: Not on file  Transportation Needs: Not on file    Last Labs:  Admission on 03/12/2021  Component Date Value Ref Range Status   SARS Coronavirus 2 by RT PCR 03/12/2021 NEGATIVE  NEGATIVE Final   Comment: (NOTE) SARS-CoV-2 target nucleic acids are NOT DETECTED.  The SARS-CoV-2 RNA is generally detectable in upper respiratory specimens during the acute phase of infection. The lowest concentration of SARS-CoV-2 viral copies this assay can detect is 138 copies/mL. A negative result does not preclude SARS-Cov-2 infection and should not be used as the sole basis for treatment or other patient management decisions. A negative result may occur with  improper specimen  collection/handling, submission of specimen other than nasopharyngeal swab, presence of viral mutation(s) within the areas targeted by this assay, and inadequate number of viral copies(<138 copies/mL). A negative result must be combined with clinical observations, patient history, and epidemiological information. The expected result is Negative.  Fact Sheet for Patients:  BloggerCourse.com  Fact Sheet for Healthcare Providers:  SeriousBroker.it  This test is no                          t yet approved or cleared by the [place] FDA and  has been authorized for detection and/or diagnosis of SARS-CoV-2 by FDA under an Emergency Use Authorization (EUA). This EUA will remain  in effect (meaning this test can be used) for the duration of the COVID-19 declaration under Section 564(b)(1) of the Act, 21 U.S.C.section 360bbb-3(b)(1), unless the authorization is terminated  or revoked sooner.       Influenza A by PCR 03/12/2021 NEGATIVE  NEGATIVE Final   Influenza B by PCR 03/12/2021 NEGATIVE  NEGATIVE Final   Comment: (NOTE) The Xpert Xpress SARS-CoV-2/FLU/RSV plus assay is intended as an aid in the diagnosis of influenza from Nasopharyngeal swab specimens and should not be used as a sole basis for treatment. Nasal washings and aspirates are unacceptable for Xpert Xpress SARS-CoV-2/FLU/RSV testing.  Fact Sheet for Patients: BloggerCourse.com  Fact Sheet for Healthcare Providers: SeriousBroker.it  This test is not yet approved or cleared by the [place] FDA and has been authorized for detection and/or diagnosis of SARS-CoV-2 by FDA under an Emergency Use Authorization (EUA). This EUA will remain in effect (meaning this test can be used) for the duration of the COVID-19 declaration under Section 564(b)(1) of the Act, 21 U.S.C. section 360bbb-3(b)(1), unless the authorization is  terminated or revoked.  Performed at [facility], [address], [place], [place] [address]    POC Amphetamine UR 03/12/2021 None Detected  NONE DETECTED (Cut Off Level 1000 ng/mL) Final   POC Secobarbital (BAR) 03/12/2021 None Detected  NONE DETECTED (Cut Off Level 300 ng/mL) Final   POC Buprenorphine (BUP) 03/12/2021 None Detected  NONE DETECTED (Cut Off Level 10 ng/mL) Final   POC Oxazepam (BZO) 03/12/2021 None Detected  NONE DETECTED (Cut Off Level 300 ng/mL) Final   POC Cocaine UR 03/12/2021 None Detected  NONE DETECTED (Cut Off Level 300 ng/mL) Final   POC Methamphetamine UR 03/12/2021 None Detected  NONE DETECTED (Cut Off Level 1000 ng/mL) Final   POC Morphine 03/12/2021 None Detected  NONE DETECTED (Cut Off Level 300 ng/mL) Final   POC Oxycodone UR 03/12/2021 None Detected  NONE DETECTED (Cut Off Level 100 ng/mL) Final   POC Methadone UR 03/12/2021 None Detected  NONE DETECTED (  Cut Off Level 300 ng/mL) Final   POC Marijuana UR 03/12/2021 None Detected  NONE DETECTED (Cut Off Level 50 ng/mL) Final   SARS Coronavirus 2 Ag 03/12/2021 Negative  Negative Preliminary  Admission on 03/11/2021, Discharged on 03/12/2021  Component Date Value Ref Range Status   SARS Coronavirus 2 by RT PCR 03/11/2021 NEGATIVE  NEGATIVE Final   Comment: (NOTE) SARS-CoV-2 target nucleic acids are NOT DETECTED.  The SARS-CoV-2 RNA is generally detectable in upper respiratory specimens during the acute phase of infection. The lowest concentration of SARS-CoV-2 viral copies this assay can detect is 138 copies/mL. A negative result does not preclude SARS-Cov-2 infection and should not be used as the sole basis for treatment or other patient management decisions. A negative result may occur with  improper specimen collection/handling, submission of specimen other than nasopharyngeal swab, presence of viral mutation(s) within the areas targeted by this assay, and inadequate number of viral copies(<138  copies/mL). A negative result must be combined with clinical observations, patient history, and epidemiological information. The expected result is Negative.  Fact Sheet for Patients:  BloggerCourse.com  Fact Sheet for Healthcare Providers:  SeriousBroker.it  This test is no                          t yet approved or cleared by the [place] FDA and  has been authorized for detection and/or diagnosis of SARS-CoV-2 by FDA under an Emergency Use Authorization (EUA). This EUA will remain  in effect (meaning this test can be used) for the duration of the COVID-19 declaration under Section 564(b)(1) of the Act, 21 U.S.C.section 360bbb-3(b)(1), unless the authorization is terminated  or revoked sooner.       Influenza A by PCR 03/11/2021 NEGATIVE  NEGATIVE Final   Influenza B by PCR 03/11/2021 NEGATIVE  NEGATIVE Final   Comment: (NOTE) The Xpert Xpress SARS-CoV-2/FLU/RSV plus assay is intended as an aid in the diagnosis of influenza from Nasopharyngeal swab specimens and should not be used as a sole basis for treatment. Nasal washings and aspirates are unacceptable for Xpert Xpress SARS-CoV-2/FLU/RSV testing.  Fact Sheet for Patients: BloggerCourse.com  Fact Sheet for Healthcare Providers: SeriousBroker.it  This test is not yet approved or cleared by the [place] FDA and has been authorized for detection and/or diagnosis of SARS-CoV-2 by FDA under an Emergency Use Authorization (EUA). This EUA will remain in effect (meaning this test can be used) for the duration of the COVID-19 declaration under Section 564(b)(1) of the Act, 21 U.S.C. section 360bbb-3(b)(1), unless the authorization is terminated or revoked.  Performed at [facility], [address], [place], [place] [address]    WBC 03/11/2021 7.7  4.0 - 10.5 K/uL Final   RBC 03/11/2021 4.86  4.22 - 5.81 MIL/uL  Final   Hemoglobin 03/11/2021 14.1  13.0 - 17.0 g/dL Final   HCT 03/18/3566 43.2  39.0 - 52.0 % Final   MCV 03/11/2021 88.9  80.0 - 100.0 fL Final   MCH 03/11/2021 29.0  26.0 - 34.0 pg Final   MCHC 03/11/2021 32.6  30.0 - 36.0 g/dL Final   RDW 01/41/0301 14.0  11.5 - 15.5 % Final   Platelets 03/11/2021 216  150 - 400 K/uL Final   nRBC 03/11/2021 0.0  0.0 - 0.2 % Final   Neutrophils Relative % 03/11/2021 66  % Final   Neutro Abs 03/11/2021 5.1  1.7 - 7.7 K/uL Final   Lymphocytes Relative 03/11/2021  25  % Final   Lymphs Abs 03/11/2021 1.9  0.7 - 4.0 K/uL Final   Monocytes Relative 03/11/2021 7  % Final   Monocytes Absolute 03/11/2021 0.6  0.1 - 1.0 K/uL Final   Eosinophils Relative 03/11/2021 1  % Final   Eosinophils Absolute 03/11/2021 0.1  0.0 - 0.5 K/uL Final   Basophils Relative 03/11/2021 1  % Final   Basophils Absolute 03/11/2021 0.0  0.0 - 0.1 K/uL Final   Immature Granulocytes 03/11/2021 0  % Final   Abs Immature Granulocytes 03/11/2021 0.02  0.00 - 0.07 K/uL Final   Performed at [facility], [address], [place], [place] [address]   Sodium 03/11/2021 139  135 - 145 mmol/L Final   Potassium 03/11/2021 3.9  3.5 - 5.1 mmol/L Final   Chloride 03/11/2021 104  98 - 111 mmol/L Final   CO2 03/11/2021 26  22 - 32 mmol/L Final   Glucose, Bld 03/11/2021 139 (A)  70 - 99 mg/dL Final   Glucose reference range applies only to samples taken after fasting for at least 8 hours.   BUN 03/11/2021 8  6 - 20 mg/dL Final   Creatinine, Ser 03/11/2021 0.79  0.61 - 1.24 mg/dL Final   Calcium 60/45/4098 9.5  8.9 - 10.3 mg/dL Final   Total Protein 11/91/4782 5.8 (A)  6.5 - 8.1 g/dL Final   Albumin 95/62/1308 3.6  3.5 - 5.0 g/dL Final   AST 65/78/4696 23  15 - 41 U/L Final   ALT 03/11/2021 23  0 - 44 U/L Final   Alkaline Phosphatase 03/11/2021 61  38 - 126 U/L Final   Total Bilirubin 03/11/2021 0.5  0.3 - 1.2 mg/dL Final   GFR, Estimated 03/11/2021 >60  >60 mL/min Final   Comment:  (NOTE) Calculated using the CKD-EPI Creatinine Equation (2021)    Anion gap 03/11/2021 9  5 - 15 Final   Performed at [facility], [address] [place], [place] [address]   Cholesterol 03/11/2021 115  0 - 200 mg/dL Final   Triglycerides 41/32/4401 57  <150 mg/dL Final   HDL 02/72/5366 55  >40 mg/dL Final   Total CHOL/HDL Ratio 03/11/2021 2.1  RATIO Final   VLDL 03/11/2021 11  0 - 40 mg/dL Final   LDL Cholesterol 03/11/2021 49  0 - 99 mg/dL Final   Comment:        Total Cholesterol/HDL:CHD Risk Coronary Heart Disease Risk Table                     Men   Women  1/2 Average Risk   3.4   3.3  Average Risk       5.0   4.4  2 X Average Risk   9.6   7.1  3 X Average Risk  23.4   11.0        Use the calculated Patient Ratio above and the CHD Risk Table to determine the patient's CHD Risk.        ATP III CLASSIFICATION (LDL):  <100     mg/dL   Optimal  440-347  mg/dL   Near or Above                    Optimal  130-159  mg/dL   Borderline  425-956  mg/dL   High  >387     mg/dL   Very High Performed at [facility], [address]  [place], [place] [address]    TSH 03/11/2021 0.759  0.350 - 4.500 uIU/mL Final   Comment: Performed by a 3rd Generation assay with a functional sensitivity of <=0.01 uIU/mL. Performed at [facility], [address] [place], [place] [address]    Color, Urine 03/11/2021 YELLOW  YELLOW Final   APPearance 03/11/2021 CLEAR  CLEAR Final   Specific Gravity, Urine 03/11/2021 1.006  1.005 - 1.030 Final   pH 03/11/2021 6.0  5.0 - 8.0 Final   Glucose, UA 03/11/2021 NEGATIVE  NEGATIVE mg/dL Final   Hgb urine dipstick 03/11/2021 NEGATIVE  NEGATIVE Final   Bilirubin Urine 03/11/2021 NEGATIVE  NEGATIVE Final   Ketones, ur 03/11/2021 NEGATIVE  NEGATIVE mg/dL Final   Protein, ur 71/69/6789 NEGATIVE  NEGATIVE mg/dL Final   Nitrite 38/01/1750 NEGATIVE  NEGATIVE Final   Leukocytes,Ua 03/11/2021 NEGATIVE  NEGATIVE Final   Performed at [facility], [address] [place], [place] [address]   SARS Coronavirus 2 Ag 03/11/2021 Negative  Negative Final   POC Amphetamine UR 03/11/2021 None Detected  NONE DETECTED (Cut Off Level 1000 ng/mL) Final   POC Secobarbital (BAR) 03/11/2021 None Detected  NONE DETECTED (Cut Off Level 300 ng/mL) Final   POC Buprenorphine (BUP) 03/11/2021 None Detected  NONE DETECTED (Cut Off Level 10 ng/mL) Final   POC Oxazepam (BZO) 03/11/2021 None Detected  NONE DETECTED (Cut Off Level 300 ng/mL) Final   POC Cocaine UR 03/11/2021 None Detected  NONE DETECTED (Cut Off Level 300 ng/mL) Final   POC Methamphetamine UR 03/11/2021 None Detected  NONE DETECTED (Cut Off Level 1000 ng/mL) Final   POC Morphine 03/11/2021 None Detected  NONE DETECTED (Cut Off Level 300 ng/mL) Final   POC Oxycodone UR 03/11/2021 None Detected  NONE DETECTED (Cut Off Level 100 ng/mL) Final   POC Methadone UR 03/11/2021 None Detected  NONE DETECTED (Cut Off Level 300 ng/mL) Final   POC Marijuana UR 03/11/2021 None Detected  NONE DETECTED (Cut Off Level 50 ng/mL) Final  Admission on 02/24/2021, Discharged on 02/25/2021  Component Date Value Ref Range Status   Sodium 02/24/2021 137  135 - 145 mmol/L Final   Potassium 02/24/2021 4.1  3.5 - 5.1 mmol/L Final   Chloride 02/24/2021 105  98 - 111 mmol/L Final   CO2 02/24/2021 25  22 - 32 mmol/L Final   Glucose, Bld 02/24/2021 107 (A)  70 - 99 mg/dL Final   Glucose reference range applies only to samples taken after fasting for at least 8 hours.   BUN 02/24/2021 12  6 - 20 mg/dL Final   Creatinine, Ser 02/24/2021 0.81  0.61 - 1.24 mg/dL Final   Calcium 27/78/2423 9.5  8.9 - 10.3 mg/dL Final   Total Protein 53/61/4431 6.1 (A)  6.5 - 8.1 g/dL Final   Albumin 54/00/8676 3.9  3.5 - 5.0 g/dL Final   AST 19/50/9326 23  15 - 41 U/L Final   ALT 02/24/2021 21  0 - 44 U/L Final   Alkaline Phosphatase 02/24/2021 53  38 - 126 U/L Final   Total Bilirubin 02/24/2021 0.5  0.3 - 1.2 mg/dL Final   GFR,  Estimated 02/24/2021 >60  >60 mL/min Final   Comment: (NOTE) Calculated using the CKD-EPI Creatinine Equation (2021)    Anion gap 02/24/2021 7  5 - 15 Final   Performed at [facility], [address], [place], [place] [address]   Alcohol, Ethyl (B) 02/24/2021 <10  <10 mg/dL Final   Comment: (  NOTE) Lowest detectable limit for serum alcohol is 10 mg/dL.  For medical purposes only. Performed at [facility], [address], [place], [place] [address]    Salicylate Lvl 02/24/2021 <7.0 (A)  7.0 - 30.0 mg/dL Final   Performed at [facility], [address] [place], [place] [address]   Acetaminophen (Tylenol), Serum 02/24/2021 <10 (A)  10 - 30 ug/mL Final   Comment: (NOTE) Therapeutic concentrations vary significantly. A range of 10-30 ug/mL  may be an effective concentration for many patients. However, some  are best treated at concentrations outside of this range. Acetaminophen concentrations >150 ug/mL at 4 hours after ingestion  and >50 ug/mL at 12 hours after ingestion are often associated with  toxic reactions.  Performed at [facility], [address], [place], [place] [address]    WBC 02/24/2021 10.4  4.0 - 10.5 K/uL Final   RBC 02/24/2021 4.79  4.22 - 5.81 MIL/uL Final   Hemoglobin 02/24/2021 14.2  13.0 - 17.0 g/dL Final   HCT 10/23/7626 42.6  39.0 - 52.0 % Final   MCV 02/24/2021 88.9  80.0 - 100.0 fL Final   MCH 02/24/2021 29.6  26.0 - 34.0 pg Final   MCHC 02/24/2021 33.3  30.0 - 36.0 g/dL Final   RDW 31/51/7616 13.2  11.5 - 15.5 % Final   Platelets 02/24/2021 172  150 - 400 K/uL Final   nRBC 02/24/2021 0.0  0.0 - 0.2 % Final   Performed at [facility], [address], [place], [place] [address]   Opiates 02/25/2021 NONE DETECTED  NONE DETECTED Final   Cocaine 02/25/2021 NONE DETECTED  NONE DETECTED Final   Benzodiazepines 02/25/2021 NONE DETECTED  NONE DETECTED Final   Amphetamines 02/25/2021 NONE DETECTED  NONE DETECTED Final    Tetrahydrocannabinol 02/25/2021 NONE DETECTED  NONE DETECTED Final   Barbiturates 02/25/2021 NONE DETECTED  NONE DETECTED Final   Comment: (NOTE) DRUG SCREEN FOR MEDICAL PURPOSES ONLY.  IF CONFIRMATION IS NEEDED FOR ANY PURPOSE, NOTIFY LAB WITHIN 5 DAYS.  LOWEST DETECTABLE LIMITS FOR URINE DRUG SCREEN Drug Class                     Cutoff (ng/mL) Amphetamine and metabolites    1000 Barbiturate and metabolites    200 Benzodiazepine                 200 Tricyclics and metabolites     300 Opiates and metabolites        300 Cocaine and metabolites        300 THC                            50 Performed at [facility], [address] [place], [place] [address]    SARS Coronavirus 2 by RT PCR 02/24/2021 NEGATIVE  NEGATIVE Final   Comment: (NOTE) SARS-CoV-2 target nucleic acids are NOT DETECTED.  The SARS-CoV-2 RNA is generally detectable in upper respiratory specimens during the acute phase of infection. The lowest concentration of SARS-CoV-2 viral copies this assay can detect is 138 copies/mL. A negative result does not preclude SARS-Cov-2 infection and should not be used as the sole basis for treatment or other patient management decisions. A negative result may occur with  improper specimen collection/handling, submission of specimen other than nasopharyngeal swab, presence of viral mutation(s) within the areas targeted by this assay, and inadequate  number of viral copies(<138 copies/mL). A negative result must be combined with clinical observations, patient history, and epidemiological information. The expected result is Negative.  Fact Sheet for Patients:  BloggerCourse.com  Fact Sheet for Healthcare Providers:  SeriousBroker.it  This test is no                          t yet approved or cleared by the [place] FDA and  has been authorized for detection and/or diagnosis of SARS-CoV-2 by FDA under an Emergency  Use Authorization (EUA). This EUA will remain  in effect (meaning this test can be used) for the duration of the COVID-19 declaration under Section 564(b)(1) of the Act, 21 U.S.C.section 360bbb-3(b)(1), unless the authorization is terminated  or revoked sooner.       Influenza A by PCR 02/24/2021 NEGATIVE  NEGATIVE Final   Influenza B by PCR 02/24/2021 NEGATIVE  NEGATIVE Final   Comment: (NOTE) The Xpert Xpress SARS-CoV-2/FLU/RSV plus assay is intended as an aid in the diagnosis of influenza from Nasopharyngeal swab specimens and should not be used as a sole basis for treatment. Nasal washings and aspirates are unacceptable for Xpert Xpress SARS-CoV-2/FLU/RSV testing.  Fact Sheet for Patients: BloggerCourse.com  Fact Sheet for Healthcare Providers: SeriousBroker.it  This test is not yet approved or cleared by the [place] FDA and has been authorized for detection and/or diagnosis of SARS-CoV-2 by FDA under an Emergency Use Authorization (EUA). This EUA will remain in effect (meaning this test can be used) for the duration of the COVID-19 declaration under Section 564(b)(1) of the Act, 21 U.S.C. section 360bbb-3(b)(1), unless the authorization is terminated or revoked.  Performed at [facility], [address], [place], [place] [address]     Allergies: Patient has no known allergies.  PTA Medications: (Not in a hospital admission)   Medical Decision Making  Based on my evaluation, patient meets criteria for admission to [facility] for continuous observation due to expressing suicidal ideations.  Admission labs have been ordered and will be initiated.   Recommendations  Based on my evaluation the patient does not appear to have an emergency medical condition.  Meta Hatchet, PA 03/13/21  4:50 AM

## 2021-03-13 NOTE — Discharge Summary (Signed)
[name] to be D/C'd Home per NP order. An After Visit Summary was printed and given to the patient. Patient escorted out and D/C home via private auto.  Dickie La  03/13/2021 7:42 AM

## 2021-03-13 NOTE — ED Provider Notes (Signed)
FBC/OBS ASAP Discharge Summary  Date and Time: 03/13/2021 7:59 AM  Name: Roberto Osborn  MRN:  [id number]   Discharge Diagnoses:  Final diagnoses:  Severe episode of recurrent major depressive disorder, with psychotic features (HCC)  Malingering    Subjective: "I want to leave. Let me out of here."    Roberto Osborn is a 50 year old male with a past psychiatric history significant for depression who presents a walk-in to [facility] on 03/13/21 due to depression and suicidal ideations. Patient was discharged on 03/12/21 from the [facility] after requesting to leave the facility.  Stay Summary: Patient seen and re-evaluated face-to-face by this provider, chart reviewed and case discussed with Dr. Jannifer Franklin. On evaluation, patient was noted to be yelling "I want to leave. Let me out of here" and banging on the plexiglass at the nurses station. When asked if he is suicidal he states "no. Let me out now." He then proceeds to walk off from this provider. He does not appear objectively to be responding to internal or external stimuli.  Upon detailed history, the patient has 7 encounters with similar presentations and lack of compliance with psychiatric evaluation, treatment regimen and follow up care. Per chart review, patient recently returned to [facility] area in October looking for a place to stay.  States it's recently become harder to  locate homeless shelters. Prior to arrival, states he was in [facility] area, historically lives in shelters and private homes. Patient's suicidality is conditioned on obtaining shelter and the patient appears to have secondary gain due to homelessness. Patient endorses suicidal ideations to obtain housing and upon achieving placement the patient no longer endorses suicidal ideations and acts out on the unit leave.   Per nursing staff, Pt is awake and has been agitated. Pt has been aggressive and verbally abusive to staff. Pt trashed  the flex area screaming " what are you going to do?"  Pt raised his balled fist in a threatening manner and called writer a "bitch." Pt was not able to to redirect.   Total Time spent with patient: 15 minutes  Past Psychiatric History: Patient diagnosed with depression 11 years ago. [facility] 10 years ago. Past Medical History: No past medical history on file. No past surgical history on file. Family History: No family history on file. Family Psychiatric History:  Social History:  Social History   Substance and Sexual Activity  Alcohol Use None     Social History   Substance and Sexual Activity  Drug Use Not on file    Social History   Socioeconomic History   Marital status: Single    Spouse name: Not on file   Number of children: Not on file   Years of education: Not on file   Highest education level: Not on file  Occupational History   Not on file  Tobacco Use   Smoking status: Every Day    Types: Cigarettes   Smokeless tobacco: Not on file  Substance and Sexual Activity   Alcohol use: Not on file   Drug use: Not on file   Sexual activity: Not on file  Other Topics Concern   Not on file  Social History Narrative   Not on file   Social Determinants of Health   Financial Resource Strain: Not on file  Food Insecurity: Not on file  Transportation Needs: Not on file  Physical Activity: Not on file  Stress: Not on file  Social Connections: Not on file  SDOH:  SDOH Screenings   Alcohol Screen: Not on file  Depression (PHQ2-9): Not on file  Financial Resource Strain: Not on file  Food Insecurity: Not on file  Housing: Not on file  Physical Activity: Not on file  Social Connections: Not on file  Stress: Not on file  Tobacco Use: High Risk   Smoking Tobacco Use: Every Day   Smokeless Tobacco Use: Unknown   Passive Exposure: Not on file  Transportation Needs: Not on file    Tobacco Cessation:  Prescription not provided because: pt declined   Current  Medications:  Current Facility-Administered Medications  Medication Dose Route Frequency Provider Last Rate Last Admin   acetaminophen (TYLENOL) tablet 650 mg  650 mg Oral Q6H PRN Nwoko, Uchenna E, PA       alum & mag hydroxide-simeth (MAALOX/MYLANTA) 200-200-20 MG/5ML suspension 30 mL  30 mL Oral Q4H PRN Nwoko, Uchenna E, PA       hydrOXYzine (ATARAX/VISTARIL) tablet 25 mg  25 mg Oral TID PRN Nwoko, Uchenna E, PA       magnesium hydroxide (MILK OF MAGNESIA) suspension 30 mL  30 mL Oral Daily PRN Nwoko, Uchenna E, PA       traZODone (DESYREL) tablet 50 mg  50 mg Oral QHS PRN Nwoko, Uchenna E, PA       No current outpatient medications on file.    PTA Medications: (Not in a hospital admission)   Musculoskeletal  Strength & Muscle Tone: within normal limits Gait & Station: normal Patient leans: N/A  Psychiatric Specialty Exam  Presentation  General Appearance: Disheveled  Eye Contact:Minimal  Speech:Clear and Coherent  Speech Volume:Increased  Handedness:Right   Mood and Affect  Mood:Irritable  Affect:Congruent   Thought Process  Thought Processes:Coherent  Descriptions of Associations:Intact  Orientation:Partial  Thought Content:Logical  Diagnosis of Schizophrenia or Schizoaffective disorder in past: Yes  Duration of Psychotic Symptoms: Greater than six months   Hallucinations: none reported Ideas of Reference:None  Suicidal Thoughts:Suicidal Thoughts: No SI Passive Intent and/or Plan: Without Intent; Without Plan  Homicidal Thoughts:Homicidal Thoughts: No   Sensorium  Memory:Immediate Fair; Recent Fair; Remote Fair  Judgment:Intact  Insight:Shallow   Executive Functions  Concentration:Fair  Attention Span:Poor  Recall:Fair  Fund of Knowledge:Fair  Language:Fair   Psychomotor Activity  Psychomotor Activity:Psychomotor Activity: Restlessness   Assets  Assets:Communication Skills; Physical Health  Sleep  Sleep:Sleep:  Fair   Nutritional Assessment (For OBS and FBC admissions only) Has the patient had a weight loss or gain of 10 pounds or more in the last 3 months?: No Has the patient had a decrease in food intake/or appetite?: No Does the patient have dental problems?: No Does the patient have eating habits or behaviors that may be indicators of an eating disorder including binging or inducing vomiting?: No Has the patient recently lost weight without trying?: 0 Has the patient been eating poorly because of a decreased appetite?: 0 Malnutrition Screening Tool Score: 0   Physical Exam  Physical Exam Neurological:     Mental Status: He is alert.   Review of Systems  Unable to perform ROS: Other (patient refused)  Blood pressure (!) 150/99, pulse 97, resp. rate 18, SpO2 100 %. There is no height or weight on file to calculate BMI.  Demographic Factors:  Male, Caucasian, Low socioeconomic status, and Unemployed  Loss Factors: Financial problems/change in socioeconomic status  Historical Factors: Impulsivity  Risk Reduction Factors:   NA  Continued Clinical Symptoms:  Previous Psychiatric Diagnoses and Treatments  Cognitive Features That Contribute To Risk:  None    Suicide Risk:  Minimal: No identifiable suicidal ideation.  Patients presenting with no risk factors but with morbid ruminations; may be classified as minimal risk based on the severity of the depressive symptoms  Plan Of Care/Follow-up recommendations:  Activity:  as tolerated   Follow-up Information     Go to  [facility].   Specialty: Urgent Care Why: Open access walk-in hours Monday - Thursday 8 am to 11 am for medication management and therapy. Contact information: [address] [place] [address] [phone]                 Disposition: discharge to home.   Asahd Can L, NP 03/13/2021, 7:59 AM

## 2021-03-13 NOTE — Progress Notes (Signed)
Pt is awake and has been agitated. Pt has been aggressive and verbally abusive to staff. Pt trashed the flex area screaming " what are you going to do?"  Pt raised his balled fist in a threatening manner and called writer a "bitch." Pt was not not able to to redirect. Show of force was enforced. Rodell Perna, NP notified. Will continue to monitor for pt's safety .

## 2021-03-16 ENCOUNTER — Telehealth (HOSPITAL_COMMUNITY): Payer: Self-pay

## 2021-03-16 NOTE — BH Assessment (Signed)
Care Management - Follow Up Discharges    Writer attempted to make contact with patient today and was unsuccessful.  Patient phone just rang and the voicemail is not set up.    Per chart review, patient was provided outpatient resources.

## 2021-03-17 ENCOUNTER — Encounter: Payer: Self-pay | Admitting: *Deleted

## 2021-03-17 NOTE — Congregational Nurse Program (Signed)
  Dept: [phone]   Congregational Nurse Program Note  Date of Encounter: 03/17/2021  Past Medical History: No past medical history on file.  Encounter Details:  CNP Questionnaire - 03/17/21 1403       Questionnaire   Do you give verbal consent to treat you today? Yes    Location Patient Served  [facility]    Visit Setting [place] [address]Phone/Text/Email    Patient Status Homeless    [employer];Medicare    Insurance Referral N/A    Medication N/A    Medical Provider No    Screening Referrals N/A    Medical Referral [facility]    Medical Appointment Made [facility]    Food N/A    Transportation Need transportation assistance    Housing/Utilities No permanent housing    Intervention Support    ED Visit Averted N/A    Life-Saving Intervention Made N/A            Client came into office requesting help seeing a doctor. Checked vitals and WNL. Pulse elevated doubt accuracy as client was constantly moving while in office. Made client an appt with [facility] for a PCP with next appt [facility] at 0900 with Dr Delford Field. Client said that was too long to wait. Client could not say why he needed to see a doctor. He was not interested in mental health services and not interested in taking medication.While in office, client was called to see [facility]. He is interested in housing options. Lakisha Peyser W RN CN

## 2021-05-06 ENCOUNTER — Ambulatory Visit: Payer: Medicare PPO | Admitting: Critical Care Medicine

## 2021-05-06 NOTE — Progress Notes (Incomplete)
New Patient Office Visit  Subjective:  Patient ID: Roberto Osborn, male    DOB: Aug 22, 1970  Age: 51 y.o. MRN: [id number]  CC: No chief complaint on file.   HPI Roberto Osborn presents for *** Colon flu hcv pcv hiv  Subjective: "I want to leave. Let me out of here."    Roberto Osborn is a 51 year old male with a past psychiatric history significant for depression who presents a walk-in to [facility] on 03/13/21 due to depression and suicidal ideations. Patient was discharged on 03/12/21 from the [facility] after requesting to leave the facility.   Stay Summary: Patient seen and re-evaluated face-to-face by this provider, chart reviewed and case discussed with Dr. Jannifer Franklin. On evaluation, patient was noted to be yelling "I want to leave. Let me out of here" and banging on the plexiglass at the nurses station. When asked if he is suicidal he states "no. Let me out now." He then proceeds to walk off from this provider. He does not appear objectively to be responding to internal or external stimuli.   Upon detailed history, the patient has 7 encounters with similar presentations and lack of compliance with psychiatric evaluation, treatment regimen and follow up care. Per chart review, patient recently returned to [facility] area in October looking for a place to stay.  States it's recently become harder to  locate homeless shelters. Prior to arrival, states he was in [facility] area, historically lives in shelters and private homes. Patient's suicidality is conditioned on obtaining shelter and the patient appears to have secondary gain due to homelessness. Patient endorses suicidal ideations to obtain housing and upon achieving placement the patient no longer endorses suicidal ideations and acts out on the unit leave.    Per nursing staff, Pt is awake and has been agitated. Pt has been aggressive and verbally abusive to staff. Pt trashed the flex area screaming "  what are you going to do?"  Pt raised his balled fist in a threatening manner and called writer a "bitch." Pt was not able to to redirect.  No past medical history on file.  No past surgical history on file.  No family history on file.  Social History   Socioeconomic History   Marital status: Single    Spouse name: Not on file   Number of children: Not on file   Years of education: Not on file   Highest education level: Not on file  Occupational History   Not on file  Tobacco Use   Smoking status: Every Day    Types: Cigarettes   Smokeless tobacco: Not on file  Substance and Sexual Activity   Alcohol use: Not on file   Drug use: Not on file   Sexual activity: Not on file  Other Topics Concern   Not on file  Social History Narrative   Not on file   Social Determinants of Health   Financial Resource Strain: Not on file  Food Insecurity: Not on file  Transportation Needs: Not on file  Physical Activity: Not on file  Stress: Not on file  Social Connections: Not on file  Intimate Partner Violence: Not on file    ROS Review of Systems  Objective:   Today's Vitals: There were no vitals taken for this visit.  Physical Exam  Assessment & Plan:   Problem List Items Addressed This Visit   None   No outpatient encounter medications on file as of 05/06/2021.  No facility-administered encounter medications on file as of 05/06/2021.    Follow-up: No follow-ups on file.   Roberto Levans, MD

## 2021-05-07 ENCOUNTER — Encounter (HOSPITAL_COMMUNITY): Payer: Self-pay | Admitting: Emergency Medicine

## 2021-05-07 ENCOUNTER — Other Ambulatory Visit: Payer: Self-pay

## 2021-05-07 ENCOUNTER — Emergency Department (HOSPITAL_COMMUNITY)
Admission: EM | Admit: 2021-05-07 | Discharge: 2021-05-08 | Disposition: A | Discharge status: Home / Self Care | Payer: Medicare PPO | Attending: Emergency Medicine | Admitting: Emergency Medicine

## 2021-05-07 DIAGNOSIS — Z5900 Homelessness unspecified: Secondary | ICD-10-CM | POA: Insufficient documentation

## 2021-05-07 DIAGNOSIS — Z765 Malingerer [conscious simulation]: Secondary | ICD-10-CM | POA: Diagnosis not present

## 2021-05-07 NOTE — ED Notes (Signed)
Called x1 for vitals, no response. Not visualized in the lobby.

## 2021-05-07 NOTE — ED Triage Notes (Signed)
BIB EMS from [facility] and [place]. Pt called EMS to take him to the hospital as he could not get into [employer].

## 2021-05-08 ENCOUNTER — Emergency Department (HOSPITAL_COMMUNITY): Payer: Medicare PPO

## 2021-05-08 NOTE — ED Notes (Signed)
Pt told EMT tech he will be back in 10 min

## 2021-05-08 NOTE — ED Provider Notes (Signed)
[place] DEPT Provider Note   CSN: [id number] Arrival date & time: 05/07/21  1750     History  No chief complaint on file.   Roberto Osborn is a 51 y.o. male.  HPI Patient presents by EMS.  He initially reported that he simply needed to get off the street.  He stayed in the ED waiting room overnight.  Currently, patient states that he is concerned about his second toe on his right foot.  He states that he kicked something 2 days ago and has since had pain.  He feels that it may be broken.  He has continued to ambulate on it although this does worsen the pain.  He denies any other physical concerns at this time.    Home Medications Prior to Admission medications   Not on File      Allergies    Patient has no known allergies.    Review of Systems   Review of Systems  Constitutional:  Negative for chills, fatigue and fever.  HENT:  Negative for ear pain and sore throat.   Eyes:  Negative for pain and visual disturbance.  Respiratory:  Negative for cough and shortness of breath.   Cardiovascular:  Negative for chest pain and palpitations.  Gastrointestinal:  Negative for abdominal pain, nausea and vomiting.  Genitourinary:  Negative for dysuria, flank pain and hematuria.  Musculoskeletal:  Positive for arthralgias (Second toe right foot). Negative for back pain, gait problem, myalgias, neck pain and neck stiffness.  Skin:  Negative for color change, rash and wound.  Neurological:  Negative for dizziness, seizures, syncope, speech difficulty, weakness, numbness and headaches.  Psychiatric/Behavioral:  Negative for confusion and self-injury.   All other systems reviewed and are negative.  Physical Exam Updated Vital Signs BP (!) 158/87    Pulse (!) 102    Resp 18    Ht 6\' 1"  (1.854 m)    Wt 91 kg    SpO2 100%    BMI 26.47 kg/m  Physical Exam Vitals and nursing note reviewed.  Constitutional:      General: He is not in acute distress.     Appearance: Normal appearance. He is well-developed and normal weight. He is not ill-appearing, toxic-appearing or diaphoretic.  HENT:     Head: Normocephalic and atraumatic.     Right Ear: External ear normal.     Left Ear: External ear normal.     Nose: Nose normal.     Mouth/Throat:     Mouth: Mucous membranes are moist.     Pharynx: Oropharynx is clear.  Eyes:     General: No scleral icterus.    Extraocular Movements: Extraocular movements intact.     Conjunctiva/sclera: Conjunctivae normal.  Cardiovascular:     Rate and Rhythm: Normal rate and regular rhythm.  Pulmonary:     Effort: Pulmonary effort is normal. No respiratory distress.  Abdominal:     Palpations: Abdomen is soft.     Tenderness: There is no abdominal tenderness.  Musculoskeletal:        General: No swelling or deformity. Normal range of motion.     Cervical back: Normal range of motion and neck supple. No rigidity.     Right lower leg: No edema.     Left lower leg: No edema.  Skin:    General: Skin is warm and dry.     Capillary Refill: Capillary refill takes less than 2 seconds.     Coloration: Skin is  not jaundiced or pale.  Neurological:     General: No focal deficit present.     Mental Status: He is alert and oriented to person, place, and time.     Cranial Nerves: No cranial nerve deficit.     Sensory: No sensory deficit.     Motor: No weakness.     Coordination: Coordination normal.  Psychiatric:        Mood and Affect: Mood normal.    ED Results / Procedures / Treatments   Labs (all labs ordered are listed, but only abnormal results are displayed) Labs Reviewed - No data to display  EKG None  Radiology No results found.  Procedures Procedures    Medications Ordered in ED Medications - No data to display  ED Course/ Medical Decision Making/ A&P                           Medical Decision Making 51 year old male with history of homelessness and multiple ED visits for malingering,  presenting to the ED via EMS in order to "get off the street".  On my initial assessment, patient endorses second toe pain on his right foot.  Patient has no breaks in skin or deformities.  He is witnessed ambulating without any discomfort.  He denies any other physical complaints.  X-ray imaging was ordered for second toe.  When x-ray tech attempted to obtain this x-ray, patient refused.  I reengaged the patient and asked what we can do for him.  He stated that he would like a sandwich.  Nursing to provide snack.  List of shelter resources was provided.  Patient was discharged in stable condition.        Final Clinical Impression(s) / ED Diagnoses Final diagnoses:  Homeless    Rx / DC Orders ED Discharge Orders     None         Godfrey Pick, MD 05/09/21 1234

## 2021-05-08 NOTE — ED Notes (Signed)
Patient transported to X-ray 

## 2021-05-09 ENCOUNTER — Other Ambulatory Visit: Payer: Self-pay

## 2021-05-09 ENCOUNTER — Emergency Department (HOSPITAL_COMMUNITY)
Admission: EM | Admit: 2021-05-09 | Discharge: 2021-05-09 | Disposition: A | Discharge status: Home / Self Care | Payer: Medicare PPO | Attending: Emergency Medicine | Admitting: Emergency Medicine

## 2021-05-09 ENCOUNTER — Encounter (HOSPITAL_COMMUNITY): Payer: Self-pay | Admitting: Emergency Medicine

## 2021-05-09 DIAGNOSIS — Z765 Malingerer [conscious simulation]: Secondary | ICD-10-CM | POA: Diagnosis not present

## 2021-05-09 DIAGNOSIS — Z5900 Homelessness unspecified: Secondary | ICD-10-CM | POA: Diagnosis present

## 2021-05-09 DIAGNOSIS — R69 Illness, unspecified: Secondary | ICD-10-CM | POA: Diagnosis not present

## 2021-05-09 HISTORY — DX: Homelessness unspecified: Z59.00

## 2021-05-09 NOTE — ED Triage Notes (Signed)
Patient states he is sick but will not tell RN how he is sick. Every question asked patient repeatedly saying "I'm sick." Patient informed tech that he has defecated on self and is stinky and dirty. Patient requesting something other then water to drink.

## 2021-05-09 NOTE — ED Provider Notes (Signed)
[facility]-EMERGENC[facility] DEPT Provider Note   CSN: [id number] Arrival date & time: 05/09/21  1018     History  Chief Complaint  Patient presents with   Sick    Roberto Osborn is a 51 y.o. male.  Patient with history of homeless presents today stating that 'it is cold outside and I dont have anywhere to go.' Patient states that he has been homeless for the past 2 years and does not have resources to find housing. He denies any medical complaints or pain. Requests food and a change of clothes as his current clothes are unclean.   The history is provided by the patient. No language interpreter was used.      Home Medications Prior to Admission medications   Not on File      Allergies    Patient has no known allergies.    Review of Systems   Review of Systems  All other systems reviewed and are negative.  Physical Exam Updated Vital Signs BP (!) 157/103 (BP Location: Left Arm)    Pulse (!) 110    Temp 98 F (36.7 C) (Oral)    Resp 18    Ht 6\' 1"  (1.854 m)    Wt 90.7 kg    SpO2 98%    BMI 26.39 kg/m  Physical Exam Vitals and nursing note reviewed.  Constitutional:      General: He is not in acute distress.    Appearance: Normal appearance. He is normal weight. He is not ill-appearing, toxic-appearing or diaphoretic.     Comments: Patient resting comfortably in chair no acute distress  HENT:     Head: Normocephalic and atraumatic.  Eyes:     Extraocular Movements: Extraocular movements intact.  Cardiovascular:     Rate and Rhythm: Normal rate.  Pulmonary:     Effort: Pulmonary effort is normal. No respiratory distress.  Musculoskeletal:        General: Normal range of motion.     Cervical back: Normal range of motion.  Skin:    General: Skin is warm and dry.  Neurological:     General: No focal deficit present.     Mental Status: He is alert.  Psychiatric:        Mood and Affect: Mood normal.        Behavior: Behavior normal.    ED Results  / Procedures / Treatments   Labs (all labs ordered are listed, but only abnormal results are displayed) Labs Reviewed - No data to display  EKG None  Radiology No results found.  Procedures Procedures    Medications Ordered in ED Medications - No data to display  ED Course/ Medical Decision Making/ A&P                           Medical Decision Making  Patient presents today for homelessness. He states that he doesn't have anywhere to go and it is cold outside. He denies any medical complaints, simply states that he is hungry and needs a change of clothes. He is observed to be ambulatory without difficulty and is afebrile, non-toxic appearing, and in no acute distress with reassuring vital signs. Snack and a change of pants given. Appears he was here last night for similar complaints. Will also include resource guide for shelters in d/c paperwork. Patient understanding, discharged in stable condition.   Final Clinical Impression(s) / ED Diagnoses Final diagnoses:  Homelessness  Rx / DC Orders ED Discharge Orders     None     An After Visit Summary was printed and given to the patient.     Vear Clock 05/09/21 1705    Mancel Bale, MD 05/09/21 [phone]

## 2021-05-09 NOTE — ED Notes (Signed)
Patient provided with food and drink at time of discharge. Reviewed resource guide with patient. Patient ambulatory out of department with security escort.

## 2021-05-09 NOTE — ED Notes (Signed)
Patient informing triage nurse he wants a room with a bed and is willing to wait longer to have a stretcher and not a recliner.

## 2021-05-09 NOTE — Discharge Instructions (Addendum)
Please use shelter resource guide for housing assistance. I have attached it to your discharge paperwork.  Return if development of any new or worsening symptoms.

## 2021-08-12 ENCOUNTER — Encounter (HOSPITAL_COMMUNITY): Payer: Self-pay

## 2021-08-12 ENCOUNTER — Emergency Department (HOSPITAL_COMMUNITY)
Admission: EM | Admit: 2021-08-12 | Discharge: 2021-08-12 | Disposition: A | Discharge status: Home / Self Care | Payer: Medicare PPO | Attending: Emergency Medicine | Admitting: Emergency Medicine

## 2021-08-12 DIAGNOSIS — G8929 Other chronic pain: Secondary | ICD-10-CM | POA: Insufficient documentation

## 2021-08-12 DIAGNOSIS — M791 Myalgia, unspecified site: Secondary | ICD-10-CM | POA: Diagnosis present

## 2021-08-12 DIAGNOSIS — Z5900 Homelessness unspecified: Secondary | ICD-10-CM | POA: Insufficient documentation

## 2021-08-12 MED ORDER — ACETAMINOPHEN 325 MG PO TABS: 650.0000 mg | ORAL_TABLET | Freq: Once | ORAL | Status: DC

## 2021-08-12 MED ORDER — IBUPROFEN 800 MG PO TABS: 800.0000 mg | ORAL_TABLET | Freq: Once | ORAL | Status: DC

## 2021-08-12 NOTE — ED Notes (Addendum)
Pt ready to go, ambulatory upon discharge. Verbalizes understanding instructions. ?

## 2021-08-12 NOTE — ED Triage Notes (Signed)
Pt presents with c/o pain all over his body. Pt reports he has been in pain, chronic in nature, since he was 51 years old. Pt also c/o some psychiatric issues, hx of depression and bipolar, denies SI/HI to EMS. ?

## 2021-08-12 NOTE — ED Provider Notes (Signed)
?[facility] DE[facility] ?Provider Note ? ? ?CSN: [id number] ?Arrival date & time: 08/12/21  0941 ? ?  ? ?History ? ?Chief Complaint  ?Patient presents with  ? Pain  ? ? ?Roberto Osborn is a 51 y.o. male with past medical history significant for homelessness, major depressive disorder, adjustment disorder who presents with concern for pain all over his body.  He has a history of multiple emergency department visits in the past for similar.  He denies any new recent injury.  He reports that he has a history of depression, bipolar, has not been taking any medications.  He denies any suicidal, homicidal ideations, audiovisual hallucinations at this time.  He denies any abdominal pain, chest pain, shortness of breath.  When prompted what he is worried about, patient reports that he is homeless, does not have a place to stay.  He denies any other medical concerns at this time.  He denies any numbness, tingling, saddle anesthesia. ? ?HPI ? ?  ? ?Home Medications ?Prior to Admission medications   ?Not on File  ?   ? ?Allergies    ?Patient has no known allergies.   ? ?Review of Systems   ?Review of Systems  ?Musculoskeletal:  Positive for myalgias.  ?Psychiatric/Behavioral:  The patient is nervous/anxious.   ?All other systems reviewed and are negative. ? ?Physical Exam ?Updated Vital Signs ?BP (!) 157/93   Pulse (!) 109   Temp 98.1 ?F (36.7 ?C) (Oral)   Resp 18   SpO2 99%  ?Physical Exam ?Vitals and nursing note reviewed.  ?Constitutional:   ?   General: He is not in acute distress. ?   Appearance: Normal appearance.  ?   Comments: Somewhat disheveled appearance, no acute distress  ?HENT:  ?   Head: Normocephalic and atraumatic.  ?Eyes:  ?   General:     ?   Right eye: No discharge.     ?   Left eye: No discharge.  ?Cardiovascular:  ?   Rate and Rhythm: Normal rate and regular rhythm.  ?   Pulses: Normal pulses.  ?   Heart sounds: No murmur heard. ?  No friction rub. No gallop.  ?Pulmonary:  ?    Effort: Pulmonary effort is normal.  ?   Breath sounds: Normal breath sounds.  ?Abdominal:  ?   General: Bowel sounds are normal.  ?   Palpations: Abdomen is soft.  ?Musculoskeletal:  ?   Comments: Intact and 5 out of 5 bilateral upper and lower extremities.  Patient is able to ambulate without difficulty.  Some tenderness to palpation of the paraspinous muscles in the lumbar back without step-off or deformity.  ?Skin: ?   General: Skin is warm and dry.  ?   Capillary Refill: Capillary refill takes less than 2 seconds.  ?Neurological:  ?   Mental Status: He is alert and oriented to person, place, and time.  ?Psychiatric:     ?   Mood and Affect: Mood normal.     ?   Behavior: Behavior normal.  ? ? ?ED Results / Procedures / Treatments   ?Labs ?(all labs ordered are listed, but only abnormal results are displayed) ?Labs Reviewed - No data to display ? ?EKG ?None ? ?Radiology ?No results found. ? ?Procedures ?Procedures  ? ? ?Medications Ordered in ED ?Medications  ?ibuprofen (ADVIL) tablet 800 mg (has no administration in time range)  ?acetaminophen (TYLENOL) tablet 650 mg (has no administration in time range)  ? ? ?  ED Course/ Medical Decision Making/ A&P ?  ?                        ?Medical Decision Making ?Risk ?OTC drugs. ?Prescription drug management. ? ? ?This is a gentleman with a history of previous emergency department visits for homelessness, malingering who presents with concerns of chronic pain present for many years, as well as some psychiatric concerns for depression, bipolar without medication.  He denies any active SI, HI, AVH at this time.  He is neurovascularly intact on my exam.  Had a discussion with the patient that I would recommend oral pain control, and follow-up with outpatient psychiatric services.  Provided with contact information for behavioral health urgent care, [facility], and other facilities.  Do not believe the patient needs further evaluation at this time without active SI, HI, AVH.   His answers are appropriate, he appears stable for discharge.  Patient understands agrees to this plan, discharged in stable condition. ?Final Clinical Impression(s) / ED Diagnoses ?Final diagnoses:  ?Other chronic pain  ?Homelessness  ? ? ?Rx / DC Orders ?ED Discharge Orders   ? ? None  ? ?  ? ? ?  ?Anselmo Pickler, PA-C ?08/12/21 1026 ? ?  ?Hayden Rasmussen, MD ?08/12/21 1751 ? ?

## 2021-08-12 NOTE — Discharge Instructions (Addendum)
Please use Tylenol or ibuprofen for pain.  You may use 600 mg ibuprofen every 6 hours or 1000 mg of Tylenol every 6 hours.  You may choose to alternate between the 2.  This would be most effective.  Not to exceed 4 g of Tylenol within 24 hours.  Not to exceed 3200 mg ibuprofen 24 hours. ? ?Not have any concerns about your psychiatric status at this time, however you can present to [facility] counseling services or behavioral health urgent care if you have any concerns.  If you develop suicidal or homicidal ideations please return to the emergency department for further evaluation. ?

## 2021-08-18 ENCOUNTER — Other Ambulatory Visit: Payer: Self-pay

## 2021-08-18 ENCOUNTER — Emergency Department (HOSPITAL_BASED_OUTPATIENT_CLINIC_OR_DEPARTMENT_OTHER)
Admission: EM | Admit: 2021-08-18 | Discharge: 2021-08-19 | Disposition: A | Discharge status: Home / Self Care | Payer: Medicare PPO | Source: Home / Self Care | Attending: Emergency Medicine | Admitting: Emergency Medicine

## 2021-08-18 ENCOUNTER — Emergency Department (HOSPITAL_COMMUNITY)
Admission: EM | Admit: 2021-08-18 | Discharge: 2021-08-18 | Disposition: A | Discharge status: Home / Self Care | Payer: Medicare PPO | Attending: Emergency Medicine | Admitting: Emergency Medicine

## 2021-08-18 ENCOUNTER — Encounter (HOSPITAL_BASED_OUTPATIENT_CLINIC_OR_DEPARTMENT_OTHER): Payer: Self-pay

## 2021-08-18 DIAGNOSIS — R059 Cough, unspecified: Secondary | ICD-10-CM | POA: Insufficient documentation

## 2021-08-18 DIAGNOSIS — Z20822 Contact with and (suspected) exposure to covid-19: Secondary | ICD-10-CM | POA: Insufficient documentation

## 2021-08-18 DIAGNOSIS — R509 Fever, unspecified: Secondary | ICD-10-CM | POA: Insufficient documentation

## 2021-08-18 DIAGNOSIS — I10 Essential (primary) hypertension: Secondary | ICD-10-CM | POA: Insufficient documentation

## 2021-08-18 DIAGNOSIS — Z5900 Homelessness unspecified: Secondary | ICD-10-CM | POA: Insufficient documentation

## 2021-08-18 DIAGNOSIS — M79673 Pain in unspecified foot: Secondary | ICD-10-CM | POA: Insufficient documentation

## 2021-08-18 DIAGNOSIS — R058 Other specified cough: Secondary | ICD-10-CM

## 2021-08-18 DIAGNOSIS — R000 Tachycardia, unspecified: Secondary | ICD-10-CM | POA: Insufficient documentation

## 2021-08-18 DIAGNOSIS — R030 Elevated blood-pressure reading, without diagnosis of hypertension: Secondary | ICD-10-CM

## 2021-08-18 LAB — RESP PANEL BY RT-PCR (FLU A&B, COVID) ARPGX2
Influenza A by PCR: NEGATIVE
Influenza B by PCR: NEGATIVE
SARS Coronavirus 2 by RT PCR: NEGATIVE

## 2021-08-18 MED ORDER — ACETAMINOPHEN 500 MG PO TABS
1000.0000 mg | ORAL_TABLET | Freq: Once | ORAL | Status: DC
  Filled 2021-08-18: qty 2

## 2021-08-18 NOTE — ED Triage Notes (Signed)
Patient here POV. ? ?Endorses being "Sick". Subjective Fevers, Cough. ? ?NAD Noted during Triage but Patient is Not Quick to Respond to Questions and Stares at the Monitor while Gripping his Hands. A&Ox4. GCS 15. Ambulatory.  ?

## 2021-08-18 NOTE — ED Triage Notes (Signed)
Patient arrived with complaints of bilateral foot pain after a bystander called EMS after seeing him laying down at a gas station, patient not giving any more information at this time.  ?

## 2021-08-18 NOTE — ED Notes (Signed)
Patient refusing to have Blood Specimens collected at this Time.  ?

## 2021-08-18 NOTE — ED Notes (Signed)
Patient provided with Snacks and Grape Juice. ?

## 2021-08-18 NOTE — ED Notes (Signed)
Patient was found masturbating in the room. RN stated You can't do that here in the hospital.Security called and escorted patient out in the ED. ?

## 2021-08-18 NOTE — ED Provider Notes (Signed)
?[facility] EMERGENCY DEPT ?Provider Note ? ? ?CSN: [id number] ?Arrival date & time: 08/18/21  2026 ? ?  ? ?History ? ?Chief Complaint  ?Patient presents with  ? Illness  ? ? ?Roberto Osborn is a 51 y.o. male. ? ?Pt c/o non prod cough, subjective fever. Symptoms present in past few days. No sore throat or trouble swallowing. No sinus pain or drainage. No headache. No neck pain or stiffness. No specific known ill contacts. Denies chest pain or discomfort. No sob. No abd pain or nvd. No dysuria or gu c/o. No skin lesions or extremity pain. Pt is a poor historian - level 5 caveat.  ? ? ?The history is provided by the patient and medical records. The history is limited by the condition of the patient.  ?Illness ?Associated symptoms: cough   ?Associated symptoms: no abdominal pain, no chest pain, no diarrhea, no headaches, no rash, no shortness of breath, no sore throat and no vomiting   ? ?  ? ?Home Medications ?Prior to Admission medications   ?Not on File  ?   ? ?Allergies    ?Patient has no known allergies.   ? ?Review of Systems   ?Review of Systems  ?Constitutional:  Negative for diaphoresis.  ?HENT:  Negative for sore throat and trouble swallowing.   ?Eyes:  Negative for redness.  ?Respiratory:  Positive for cough. Negative for shortness of breath.   ?Cardiovascular:  Negative for chest pain and leg swelling.  ?Gastrointestinal:  Negative for abdominal pain, diarrhea and vomiting.  ?Genitourinary:  Negative for dysuria and flank pain.  ?Musculoskeletal:  Negative for back pain, neck pain and neck stiffness.  ?Skin:  Negative for rash.  ?Neurological:  Negative for headaches.  ?Hematological:  Does not bruise/bleed easily.  ?Psychiatric/Behavioral:  Negative for agitation.   ? ?Physical Exam ?Updated Vital Signs ?BP (!) 167/64 (BP Location: Right Arm)   Pulse (!) 124   Temp 98.9 ?F (37.2 ?C)   Resp 16   Ht 1.854 m (6\' 1" )   Wt 90.7 kg   SpO2 97%   BMI 26.38 kg/m?  ?Physical Exam ?Vitals and  nursing note reviewed.  ?Constitutional:   ?   Appearance: Normal appearance. He is well-developed.  ?HENT:  ?   Head: Atraumatic.  ?   Nose: Nose normal.  ?   Mouth/Throat:  ?   Mouth: Mucous membranes are moist.  ?   Pharynx: Oropharynx is clear.  ?Eyes:  ?   General: No scleral icterus. ?   Conjunctiva/sclera: Conjunctivae normal.  ?   Pupils: Pupils are equal, round, and reactive to light.  ?Neck:  ?   Trachea: No tracheal deviation.  ?   Comments: No stiffness or rigidity.  ?Cardiovascular:  ?   Rate and Rhythm: Regular rhythm. Tachycardia present.  ?   Pulses: Normal pulses.  ?   Heart sounds: Normal heart sounds. No murmur heard. ?  No friction rub. No gallop.  ?   Comments: Hr 110.  ?Pulmonary:  ?   Effort: Pulmonary effort is normal. No accessory muscle usage or respiratory distress.  ?   Breath sounds: Normal breath sounds.  ?Abdominal:  ?   General: Bowel sounds are normal. There is no distension.  ?   Palpations: Abdomen is soft.  ?   Tenderness: There is no abdominal tenderness. There is no guarding.  ?Genitourinary: ?   Comments: No cva tenderness. ?Musculoskeletal:     ?   General: No swelling or tenderness.  ?  Cervical back: Normal range of motion and neck supple. No rigidity.  ?   Right lower leg: No edema.  ?   Left lower leg: No edema.  ?   Comments: CTLS spine, non tender, aligned, no step off. ?No focal extremity pain, swelling or tenderness noted.   ?Lymphadenopathy:  ?   Cervical: No cervical adenopathy.  ?Skin: ?   General: Skin is warm and dry.  ?   Findings: No rash.  ?Neurological:  ?   Mental Status: He is alert.  ?   Comments: Alert, speech clear. Motor/sens grossly intact bil. No tremor or shakes. Steady gait.   ?Psychiatric:     ?   Mood and Affect: Mood normal.  ? ? ?ED Results / Procedures / Treatments   ?Labs ?(all labs ordered are listed, but only abnormal results are displayed) ?Results for orders placed or performed during the hospital encounter of 08/18/21  ?Resp Panel by  RT-PCR (Flu A&B, Covid) Nasopharyngeal Swab  ? Specimen: Nasopharyngeal Swab; Nasopharyngeal(NP) swabs in vial transport medium  ?Result Value Ref Range  ? SARS Coronavirus 2 by RT PCR NEGATIVE NEGATIVE  ? Influenza A by PCR NEGATIVE NEGATIVE  ? Influenza B by PCR NEGATIVE NEGATIVE  ? ? ? ?EKG ?EKG Interpretation ? ?Date/Time:  Wednesday August 18 2021 21:17:35 EDT ?Ventricular Rate:  112 ?PR Interval:  112 ?QRS Duration: 84 ?QT Interval:  310 ?QTC Calculation: 423 ?R Axis:   79 ?Text Interpretation: Sinus tachycardia Otherwise normal ECG No previous ECGs available Confirmed by Cathren Laine ([id number]) on 08/18/2021 9:31:02 PM ? ?Radiology ?No results found. ? ?Procedures ?Procedures  ? ? ?Medications Ordered in ED ?Medications - No data to display ? ?ED Course/ Medical Decision Making/ A&P ?  ?                        ?Medical Decision Making ?Problems Addressed: ?Elevated blood pressure reading: acute illness or injury ?Non-productive cough: acute illness or injury with systemic symptoms ?Subjective fever: acute illness or injury with systemic symptoms ? ?Amount and/or Complexity of Data Reviewed ?External Data Reviewed: notes. ?Labs: ordered. Decision-making details documented in ED Course. ? ? ?Iv ns. Continuous pulse ox and cardiac monitoring. Labs ordered/sent. ? ?Reviewed nursing notes and prior charts for additional history. External reports reviewed.  Prior charts, hr in 100-112 range.  ? ?Cardiac monitor: sinus rhythm, rate 112. ? ?Pt agrees to swab, but refuses labs/blood work.   ? ?Po fluids/food. ? ?Currently hr 104, rr 16. Pulse ox is 97%.  ? ?Labs reviewed/interpreted by me - covid neg.  ? ?Pt continues to refuse blood work, requests d/c.  ? ?Pt appears stable for d/c.  ? ?Rec pcp f/u. ? ?Also provided social services/shelter information and additional resource guides.  ? ?Return precautions provided.  ? ? ? ? ? ? ? ? ? ? ? ? ?Final Clinical Impression(s) / ED Diagnoses ?Final diagnoses:  ?None  ? ? ?Rx /  DC Orders ?ED Discharge Orders   ? ? None  ? ?  ? ? ?  ?Cathren Laine, MD ?08/18/21 2355 ? ?

## 2021-08-18 NOTE — ED Provider Notes (Signed)
?   [facility]Provider Note ? ? ?CSN: [id number] ?Arrival date & time: 08/18/21  0107 ? ?  ? ?History ? ?Chief Complaint  ?Patient presents with  ? Foot Pain  ? ? ?Roberto Osborn is a 51 y.o. male. ? ?51 year old male who presents to the ER today secondary to homelessness.  Patient initially states that his ankles hurt from walking too much.  I offered him some Tylenol.  Patient then stated he had nowhere to go and that I was required to find a place for him to sleep.  He has no other complaints.  He then suggested that I sent him to behavioral health.  Denies any behavioral issues ? ? ?Foot Pain ? ? ?  ? ?Home Medications ?Prior to Admission medications   ?Not on File  ?   ? ?Allergies    ?Patient has no known allergies.   ? ?Review of Systems   ?Review of Systems ? ?Physical Exam ?Updated Vital Signs ?BP 130/82 (BP Location: Left Arm)   Pulse (!) 112   Temp 98.1 ?F (36.7 ?C) (Oral)   Resp 18   Ht 6\' 1"  (1.854 m)   Wt 90.7 kg   SpO2 100%   BMI 26.39 kg/m?  ?Physical Exam ?Vitals and nursing note reviewed.  ?Constitutional:   ?   Appearance: He is well-developed.  ?HENT:  ?   Head: Normocephalic and atraumatic.  ?   Mouth/Throat:  ?   Mouth: Mucous membranes are moist.  ?   Pharynx: Oropharynx is clear.  ?Eyes:  ?   Pupils: Pupils are equal, round, and reactive to light.  ?Cardiovascular:  ?   Rate and Rhythm: Normal rate.  ?Pulmonary:  ?   Effort: Pulmonary effort is normal. No respiratory distress.  ?Abdominal:  ?   General: Abdomen is flat. There is no distension.  ?Musculoskeletal:     ?   General: Normal range of motion.  ?   Cervical back: Normal range of motion.  ?Skin: ?   General: Skin is warm and dry.  ?   Coloration: Skin is not jaundiced or pale.  ?Neurological:  ?   General: No focal deficit present.  ?   Mental Status: He is alert.  ? ? ?ED Results / Procedures / Treatments   ?Labs ?(all labs ordered are listed, but only abnormal results are displayed) ?Labs  Reviewed - No data to display ? ?EKG ?None ? ?Radiology ?No results found. ? ?Procedures ?Procedures  ? ? ?Medications Ordered in ED ?Medications  ?acetaminophen (TYLENOL) tablet 1,000 mg (has no administration in time range)  ? ? ?ED Course/ Medical Decision Making/ A&P ?  ?                        ?Medical Decision Making ?Risk ?OTC drugs. ? ? ?Seems to be malingering. No apparent emergent medical condition.  ? ? ?Final Clinical Impression(s) / ED Diagnoses ?Final diagnoses:  ?Homeless  ? ? ?Rx / DC Orders ?ED Discharge Orders   ? ? None  ? ?  ? ? ?  ? , MD ?08/18/21 08/20/21 ? ?

## 2021-08-18 NOTE — Discharge Instructions (Addendum)
It was our pleasure to provide your ER care today - we hope that you feel better. ? ?Drink plenty of fluids/stay well hydrated.  ? ?Take acetaminophen or ibuprofen as need.  ? ?Follow up with primary care doctor in one week - also have your blood pressure rechecked then, as it is high today. ? ?For mental health issues and/or crisis, you may go to the [facility] - it is open 24/7 and walk-ins are welcome. ? ?Return to ER if worse, new symptoms, high fevers, increased trouble breathing, new/severe pain, or other concern.  ?

## 2021-08-19 NOTE — ED Notes (Signed)
Went in to d/c pt, pt says he can't leave the hospital because his feet hurt and he can't leave because he can't walk, proceeds to start taking off his socks, "look at my feet! Look at my feet!" I asked him how he got here, he said he walked here. I discussed paperwork with him and resources available to him that was provided in d/c paperwork. Pt started cursing at this RN, I handed him his paperwork and told him he has been discharged from the facility. Notified security of situation. Pt comes cursing and yelling out of room into lobby, leaves lobby, starts digging in trash outside, comes back in states he is looking for his jacket. Gave him his jacket and blanket (that he previously left)  and then he starts yelling asking where his lighter and cigarettes are repetitively. Screaming he is going to "call the president". Reached in his pockets, found his belongings then left through lobby doors yelling and cursing.  ?

## 2021-08-27 ENCOUNTER — Other Ambulatory Visit: Payer: Self-pay

## 2021-08-27 ENCOUNTER — Encounter (HOSPITAL_COMMUNITY): Payer: Self-pay | Admitting: *Deleted

## 2021-08-27 ENCOUNTER — Emergency Department (HOSPITAL_COMMUNITY)
Admission: EM | Admit: 2021-08-27 | Discharge: 2021-08-28 | Disposition: A | Discharge status: Home / Self Care | Payer: Medicare PPO | Attending: Emergency Medicine | Admitting: Emergency Medicine

## 2021-08-27 DIAGNOSIS — F332 Major depressive disorder, recurrent severe without psychotic features: Secondary | ICD-10-CM | POA: Diagnosis not present

## 2021-08-27 DIAGNOSIS — F329 Major depressive disorder, single episode, unspecified: Secondary | ICD-10-CM | POA: Diagnosis present

## 2021-08-27 DIAGNOSIS — F4320 Adjustment disorder, unspecified: Secondary | ICD-10-CM | POA: Diagnosis not present

## 2021-08-27 DIAGNOSIS — Z5900 Homelessness unspecified: Secondary | ICD-10-CM | POA: Diagnosis not present

## 2021-08-27 DIAGNOSIS — R45851 Suicidal ideations: Secondary | ICD-10-CM | POA: Insufficient documentation

## 2021-08-27 DIAGNOSIS — Z20822 Contact with and (suspected) exposure to covid-19: Secondary | ICD-10-CM | POA: Diagnosis not present

## 2021-08-27 DIAGNOSIS — Z79899 Other long term (current) drug therapy: Secondary | ICD-10-CM | POA: Diagnosis not present

## 2021-08-27 DIAGNOSIS — Z046 Encounter for general psychiatric examination, requested by authority: Secondary | ICD-10-CM | POA: Diagnosis present

## 2021-08-27 LAB — ETHANOL: Alcohol, Ethyl (B): <10 mg/dL (ref <10)

## 2021-08-27 LAB — COMPREHENSIVE METABOLIC PANEL
ALT: 14 U/L (ref 0–44)
AST: 16 U/L (ref 15–41)
Albumin: 4.2 g/dL (ref 3.5–5.0)
Alkaline Phosphatase: 74 U/L (ref 38–126)
Anion gap: 7 (ref 5–15)
BUN: 14 mg/dL (ref 6–20)
CO2: 25 mmol/L (ref 22–32)
Calcium: 9.5 mg/dL (ref 8.9–10.3)
Chloride: 104 mmol/L (ref 98–111)
Creatinine, Ser: 0.59 mg/dL — ABNORMAL LOW (ref 0.61–1.24)
GFR, Estimated: >60 mL/min (ref >60)
Glucose, Bld: 97 mg/dL (ref 70–99)
Potassium: 4.2 mmol/L (ref 3.5–5.1)
Sodium: 136 mmol/L (ref 135–145)
Total Bilirubin: 0.5 mg/dL (ref 0.3–1.2)
Total Protein: 6.9 g/dL (ref 6.5–8.1)

## 2021-08-27 LAB — CBC
HCT: 41.2 % (ref 39.0–52.0)
Hemoglobin: 14.1 g/dL (ref 13.0–17.0)
MCH: 31.4 pg (ref 26.0–34.0)
MCHC: 34.2 g/dL (ref 30.0–36.0)
MCV: 91.8 fL (ref 80.0–100.0)
Platelets: 218 10*3/uL (ref 150–400)
RBC: 4.49 MIL/uL (ref 4.22–5.81)
RDW: 14.1 % (ref 11.5–15.5)
WBC: 6.5 10*3/uL (ref 4.0–10.5)
nRBC: 0 % (ref 0.0–0.2)

## 2021-08-27 LAB — ACETAMINOPHEN LEVEL: Acetaminophen (Tylenol), Serum: <10 ug/mL — ABNORMAL LOW (ref 10–30)

## 2021-08-27 LAB — SALICYLATE LEVEL: Salicylate Lvl: <7 mg/dL — ABNORMAL LOW (ref 7.0–30.0)

## 2021-08-27 LAB — RESP PANEL BY RT-PCR (FLU A&B, COVID) ARPGX2
Influenza A by PCR: NEGATIVE
Influenza B by PCR: NEGATIVE
SARS Coronavirus 2 by RT PCR: NEGATIVE

## 2021-08-27 LAB — RAPID URINE DRUG SCREEN, HOSP PERFORMED
Amphetamines: NOT DETECTED
Barbiturates: NOT DETECTED
Benzodiazepines: NOT DETECTED
Cocaine: NOT DETECTED
Opiates: NOT DETECTED
Tetrahydrocannabinol: NOT DETECTED

## 2021-08-27 LAB — CBG MONITORING, ED: Glucose-Capillary: 116 mg/dL — ABNORMAL HIGH (ref 70–99)

## 2021-08-27 MED ORDER — CITALOPRAM HYDROBROMIDE 10 MG PO TABS
10.0000 mg | ORAL_TABLET | Freq: Every day | ORAL | Status: DC
  Administered 2021-08-27: 10 mg via ORAL
  Filled 2021-08-27 (×2): qty 1

## 2021-08-27 MED ORDER — RISPERIDONE 1 MG PO TABS
1.0000 mg | ORAL_TABLET | Freq: Every day | ORAL | Status: DC
  Administered 2021-08-27: 1 mg via ORAL
  Filled 2021-08-27: qty 1

## 2021-08-27 MED ORDER — NICOTINE 21 MG/24HR TD PT24
21.0000 mg | MEDICATED_PATCH | Freq: Once | TRANSDERMAL | Status: AC
Start: 2021-08-27 — End: 2021-08-28
  Administered 2021-08-27: 21 mg via TRANSDERMAL
  Filled 2021-08-27: qty 1

## 2021-08-27 MED ORDER — ACETAMINOPHEN 325 MG PO TABS
650.0000 mg | ORAL_TABLET | ORAL | Status: DC | PRN
  Administered 2021-08-27: 650 mg via ORAL
  Filled 2021-08-27: qty 2

## 2021-08-27 MED ORDER — ACETAMINOPHEN 500 MG PO TABS
1000.0000 mg | ORAL_TABLET | Freq: Once | ORAL | Status: AC
  Administered 2021-08-27: 1000 mg via ORAL
  Filled 2021-08-27: qty 2

## 2021-08-27 NOTE — ED Triage Notes (Signed)
Pt who is experiencing homelessness was at an [employer] station and would not leave.  [employer] was involved and called ems who found pt to be displaying OCD symptoms and ALOC without focal neuro deficit.   ?

## 2021-08-27 NOTE — ED Provider Notes (Signed)
?[facility] ?Provider Note ? ? ?CSN: [id number] ?Arrival date & time: 08/27/21  1448 ? ?  ? ?History ? ?Chief Complaint  ?Patient presents with  ? Medical Clearance  ? ? ?Roberto Osborn is a 51 y.o. male. ? ?Patient is a 51 year old male who presents with some atypical behavior.  He is homeless.  Per EMS, the police department found a minute [employer] station and he appeared to be talking to himself.  They were concerned about his mental state and brought him here.  He says he has been homeless for over 2 years and he needs to get off the streets.  He denies any alcohol or drug use.  He does have depression and has thoughts of suicide but he says if he is off the streets, he feels that his suicidal thoughts would resolve.  When I ask him if he is having any hallucinations, he says "my mind is all messed up".  He denies any recent illnesses or physical complaints. ? ? ?  ? ?Home Medications ?Prior to Admission medications   ?Not on File  ?   ? ?Allergies    ?Patient has no known allergies.   ? ?Review of Systems   ?Review of Systems  ?Constitutional:  Negative for chills, diaphoresis, fatigue and fever.  ?HENT:  Negative for congestion, rhinorrhea and sneezing.   ?Eyes: Negative.   ?Respiratory:  Negative for cough, chest tightness and shortness of breath.   ?Cardiovascular:  Negative for chest pain and leg swelling.  ?Gastrointestinal:  Negative for abdominal pain, diarrhea, nausea and vomiting.  ?Genitourinary:  Negative for difficulty urinating, flank pain, frequency and hematuria.  ?Musculoskeletal:  Negative for arthralgias and back pain.  ?Skin:  Negative for rash.  ?Neurological:  Negative for dizziness, speech difficulty, weakness, numbness and headaches.  ?Psychiatric/Behavioral:  Positive for suicidal ideas.   ? ?Physical Exam ?Updated Vital Signs ?BP (!) 143/88   Pulse 79   Temp 97.6 ?F (36.4 ?C) (Oral)   Resp 18   SpO2 100%  ?Physical Exam ?Constitutional:   ?    Appearance: He is well-developed.  ?   Comments: Disheveled, calm and cooperative  ?HENT:  ?   Head: Normocephalic and atraumatic.  ?Eyes:  ?   Pupils: Pupils are equal, round, and reactive to light.  ?Cardiovascular:  ?   Rate and Rhythm: Normal rate and regular rhythm.  ?   Heart sounds: Normal heart sounds.  ?Pulmonary:  ?   Effort: Pulmonary effort is normal. No respiratory distress.  ?   Breath sounds: Normal breath sounds. No wheezing or rales.  ?Chest:  ?   Chest wall: No tenderness.  ?Abdominal:  ?   General: Bowel sounds are normal.  ?   Palpations: Abdomen is soft.  ?   Tenderness: There is no abdominal tenderness. There is no guarding or rebound.  ?Musculoskeletal:     ?   General: Normal range of motion.  ?   Cervical back: Normal range of motion and neck supple.  ?Lymphadenopathy:  ?   Cervical: No cervical adenopathy.  ?Skin: ?   General: Skin is warm and dry.  ?   Findings: No rash.  ?Neurological:  ?   General: No focal deficit present.  ?   Mental Status: He is alert and oriented to person, place, and time.  ? ? ?ED Results / Procedures / Treatments   ?Labs ?(all labs ordered are listed, but only abnormal results are displayed) ?Labs Reviewed  ?  COMPREHENSIVE METABOLIC PANEL - Abnormal; Notable for the following components:  ?    Result Value  ? Creatinine, Ser 0.59 (*)   ? All other components within normal limits  ?ACETAMINOPHEN LEVEL - Abnormal; Notable for the following components:  ? Acetaminophen (Tylenol), Serum <10 (*)   ? All other components within normal limits  ?SALICYLATE LEVEL - Abnormal; Notable for the following components:  ? Salicylate Lvl <7.0 (*)   ? All other components within normal limits  ?CBG MONITORING, ED - Abnormal; Notable for the following components:  ? Glucose-Capillary 116 (*)   ? All other components within normal limits  ?RESP PANEL BY RT-PCR (FLU A&B, COVID) ARPGX2  ?CBC  ?RAPID URINE DRUG SCREEN, HOSP PERFORMED  ?ETHANOL  ? ? ?EKG ?None ? ?Radiology ?No results  found. ? ?Procedures ?Procedures  ? ? ?Medications Ordered in ED ?Medications  ?acetaminophen (TYLENOL) tablet 650 mg (has no administration in time range)  ?nicotine (NICODERM CQ - dosed in mg/24 hours) patch 21 mg (has no administration in time range)  ?acetaminophen (TYLENOL) tablet 1,000 mg (1,000 mg Oral Given 08/27/21 1145)  ? ? ?ED Course/ Medical Decision Making/ A&P ?  ?                        ?Medical Decision Making ?Amount and/or Complexity of Data Reviewed ?Labs: ordered. ? ?Risk ?OTC drugs. ? ? ?Patient is a 51 year old who is currently homeless.  He has a history of depression and adjustment disorder but I do not see any other diagnosed psychiatric disorders.  He has a bit of a bizarre affect but he does not seem to be having any hallucinations.  He is fully oriented.  He has no focal neurologic deficits.  No medical complaints.  His labs are reviewed and are nonconcerning.  He is complaining of some suicidal ideations, mostly because of his homelessness it seems.  He is awaiting TTS evaluation but is currently medically cleared for evaluation. ? ?Final Clinical Impression(s) / ED Diagnoses ?Final diagnoses:  ?Suicidal ideation  ?Homelessness  ? ? ?Rx / DC Orders ?ED Discharge Orders   ? ? None  ? ?  ? ? ?  ?Rolan Bucco, MD ?08/27/21 1345 ? ?

## 2021-08-27 NOTE — Progress Notes (Signed)
CSW requested that [facility], RN review pt. ? ? ?Maryjean Ka, MSW, LCSWA ?08/27/2021 11:34 PM ? ? ?

## 2021-08-27 NOTE — Consult Note (Addendum)
[facility] Face-to-Face Psychiatry Consult  ? ?Reason for Consult:  Psychiatry evaluation ?Referring Physician:  ER Physician ?Patient Identification: Roberto Osborn ?MRN:  [id number] ?Principal Diagnosis: Major depressive disorder, recurrent severe without psychotic features ([place]) ?Diagnosis:  Principal Problem: ?  Major depressive disorder, recurrent severe without psychotic features ([place]) ?Active Problems: ?  Adjustment disorder ?  Major depressive disorder ? ? ?Total Time spent with patient: 30 minutes ? ?Subjective:   ?Roberto Osborn is a 51 y.o. male patient admitted with hx of Schizophrenia/Schizoaffective disorder,  Homelessness and substance abuse per past records.  He came in to the ER with c/o of  getting tired of Homelessness ? ?HPI:  Patient was seen by this provider for psych evaluation.  This is one of recent frequent visit to ER with c/o getting tired of living in the street.  Patient denied feeling much Depressed but is more anxious.  His anxiety he says is related to being Homeless for 2 years.  Patient reported that he has pain all over his body except his Chest.  He reported his Generalized body pain is as a result of walking under rain and sun shine.  Patient then said he could not move his extremities anymore but immediately list both legs and arms up in the air.  He then reported that if he should leave the ER still homeless he will kill himself.  He denied feeling suicidal if he wakes up not homeless.  Patient reported no living family member and no support.  He repeatedly stated that he will kill him self if he is sent out homeless.  Patient added that giving him resources will not help.  He appeared disorganized and most of his answers and gestures were were disorganized and not related to topic in discussion.  Patient is not taking any Psychotropic medications and have no outpatient provider.  He,  again endorses suicide related to to being homeless, denied AVH and no mention of paranoia. ?We  will admit this patient and resume Psychotropic medications.  He admitted to taking  Depakote long time ago and no longer taking.  His frequent visits to the ER warrants bringing him in for treatment and re introducing him to Mental health care. ? ?Past Psychiatric History: Schizophrenia/Schizoaffective disorder,  Homelessness and substance abuse per past records.  Past hx inpatient Psychiatric hospitalization as far back as 2012 but unknown hospital. ? ?Risk to Self:   ?Risk to Others:   ?Prior Inpatient Therapy:   ?Prior Outpatient Therapy:   ? ?Past Medical History:  ?Past Medical History:  ?Diagnosis Date  ? Homeless   ? History reviewed. No pertinent surgical history. ?Family History: No family history on file. ?Family Psychiatric  History: unknown ?Social History:  ?Social History  ? ?Substance and Sexual Activity  ?Alcohol Use None  ?   ?Social History  ? ?Substance and Sexual Activity  ?Drug Use Not on file  ?  ?Social History  ? ?Socioeconomic History  ? Marital status: Single  ?  Spouse name: Not on file  ? Number of children: Not on file  ? Years of education: Not on file  ? Highest education level: Not on file  ?Occupational History  ? Not on file  ?Tobacco Use  ? Smoking status: Every Day  ?  Types: Cigarettes  ? Smokeless tobacco: Not on file  ?Substance and Sexual Activity  ? Alcohol use: Not on file  ? Drug use: Not on file  ? Sexual activity: Not on file  ?  Other Topics Concern  ? Not on file  ?Social History Narrative  ? Not on file  ? ?Social Determinants of Health  ? ?Financial Resource Strain: Not on file  ?Food Insecurity: Not on file  ?Transportation Needs: Not on file  ?Physical Activity: Not on file  ?Stress: Not on file  ?Social Connections: Not on file  ? ?Additional Social History: ?  ? ?Allergies:  No Known Allergies ? ?Labs:  ?Results for orders placed or performed during the hospital encounter of 08/27/21 (from the past 48 hour(s))  ?CBG monitoring, ED     Status: Abnormal  ? Collection  Time: 08/27/21  9:49 AM  ?Result Value Ref Range  ? Glucose-Capillary 116 (H) 70 - 99 mg/dL  ?  Comment: Glucose reference range applies only to samples taken after fasting for at least 8 hours.  ?Urine rapid drug screen (hosp performed)     Status: None  ? Collection Time: 08/27/21 10:07 AM  ?Result Value Ref Range  ? Opiates NONE DETECTED NONE DETECTED  ? Cocaine NONE DETECTED NONE DETECTED  ? Benzodiazepines NONE DETECTED NONE DETECTED  ? Amphetamines NONE DETECTED NONE DETECTED  ? Tetrahydrocannabinol NONE DETECTED NONE DETECTED  ? Barbiturates NONE DETECTED NONE DETECTED  ?  Comment: (NOTE) ?DRUG SCREEN FOR MEDICAL PURPOSES ?ONLY.  IF CONFIRMATION IS NEEDED ?FOR ANY PURPOSE, NOTIFY LAB ?WITHIN 5 DAYS. ? ?LOWEST DETECTABLE LIMITS ?FOR URINE DRUG SCREEN ?Drug Class                     Cutoff (ng/mL) ?Amphetamine and metabolites    1000 ?Barbiturate and metabolites    200 ?Benzodiazepine                 200 ?Tricyclics and metabolites     300 ?Opiates and metabolites        300 ?Cocaine and metabolites        300 ?THC                            50 ?Performed at [facility], [place] [address] ?[place], [place] [address] ?  ?Comprehensive metabolic panel     Status: Abnormal  ? Collection Time: 08/27/21 10:35 AM  ?Result Value Ref Range  ? Sodium 136 135 - 145 mmol/L  ? Potassium 4.2 3.5 - 5.1 mmol/L  ? Chloride 104 98 - 111 mmol/L  ? CO2 25 22 - 32 mmol/L  ? Glucose, Bld 97 70 - 99 mg/dL  ?  Comment: Glucose reference range applies only to samples taken after fasting for at least 8 hours.  ? BUN 14 6 - 20 mg/dL  ? Creatinine, Ser 0.59 (L) 0.61 - 1.24 mg/dL  ? Calcium 9.5 8.9 - 10.3 mg/dL  ? Total Protein 6.9 6.5 - 8.1 g/dL  ? Albumin 4.2 3.5 - 5.0 g/dL  ? AST 16 15 - 41 U/L  ? ALT 14 0 - 44 U/L  ? Alkaline Phosphatase 74 38 - 126 U/L  ? Total Bilirubin 0.5 0.3 - 1.2 mg/dL  ? GFR, Estimated >60 >60 mL/min  ?  Comment: (NOTE) ?Calculated using the CKD-EPI Creatinine Equation (2021) ?  ? Anion gap 7  5 - 15  ?  Comment: Performed at [facility], [place] [address] [place], [place] [address]  ?CBC     Status: None  ? Collection Time: 08/27/21 10:35 AM  ?Result Value Ref Range  ?  WBC 6.5 4.0 - 10.5 K/uL  ? RBC 4.49 4.22 - 5.81 MIL/uL  ? Hemoglobin 14.1 13.0 - 17.0 g/dL  ? HCT 41.2 39.0 - 52.0 %  ? MCV 91.8 80.0 - 100.0 fL  ? MCH 31.4 26.0 - 34.0 pg  ? MCHC 34.2 30.0 - 36.0 g/dL  ? RDW 14.1 11.5 - 15.5 %  ? Platelets 218 150 - 400 K/uL  ? nRBC 0.0 0.0 - 0.2 %  ?  Comment: Performed at [facility], [place] [address] [place], [place] [address]  ?Acetaminophen level     Status: Abnormal  ? Collection Time: 08/27/21 10:35 AM  ?Result Value Ref Range  ? Acetaminophen (Tylenol), Serum <10 (L) 10 - 30 ug/mL  ?  Comment: (NOTE) ?Therapeutic concentrations vary significantly. A range of 10-30 ug/mL  ?may be an effective concentration for many patients. However, some  ?are best treated at concentrations outside of this range. ?Acetaminophen concentrations >150 ug/mL at 4 hours after ingestion  ?and >50 ug/mL at 12 hours after ingestion are often associated with  ?toxic reactions. ? ?Performed at [facility], [place] [address] ?[place], [place] [address] ?  ?Salicylate level     Status: Abnormal  ? Collection Time: 08/27/21 10:35 AM  ?Result Value Ref Range  ? Salicylate Lvl [id number] (L) 7.0 - 30.0 mg/dL  ?  Comment: Performed at [facility], [place] [address], [place], [place] [address]  ?Ethanol     Status: None  ? Collection Time: 08/27/21 10:35 AM  ?Result Value Ref Range  ? Alcohol, Ethyl (B) <10 <10 mg/dL  ?  Comment: (NOTE) ?Lowest detectable limit for serum alcohol is 10 mg/dL. ? ?For medical purposes only. ?Performed at [facility], [place] [address] ?[place], [place] [address] ?  ?Resp Panel by RT-PCR (Flu A&B, Covid) Nasopharyngeal Swab     Status: None  ? Collection Time: 08/27/21  2:11 PM  ? Specimen: Nasopharyngeal Swab;  Nasopharyngeal(NP) swabs in vial transport medium  ?Result Value Ref Range  ? SARS Coronavirus 2 by RT PCR NEGATIVE NEGATIVE  ?  Comment: (NOTE) ?SARS-CoV-2 target nucleic acids are NOT DETECTED. ? ?The SARS-C

## 2021-08-27 NOTE — ED Notes (Signed)
Pt reports homelessness and feeling SI related to this.  Pt appears to be speaking to himself but is calm and cooperative at this time.  NIHSS 0.  Pt was changed into purple scrubs.  ?

## 2021-08-27 NOTE — ED Notes (Signed)
Pt is washing himself at the sink ?

## 2021-08-27 NOTE — ED Notes (Signed)
Pt given sandwich and drink. Pt given lotion for hands. ?

## 2021-08-27 NOTE — ED Notes (Signed)
PT OOB to take a shower. Agrees to take nightly medications and requested tylenol for pain. NAD, and  ?cooperative at this time. ?

## 2021-08-27 NOTE — ED Notes (Signed)
Patient to room 27. Patient oriented to unit and room. Patient cooperative. Restless. Patient given a sandwich and drink, ?

## 2021-08-27 NOTE — ED Notes (Addendum)
Pt said if they put him back on the streets he is going to kill himself ?

## 2021-08-27 NOTE — Progress Notes (Signed)
Inpatient Behavioral Health Placement ? ?Meets inpatient criteria per Dahlia Byes, NP.  There are no available beds at [name] per  Referral was sent to the following facilities;  ? ?Destination ?Service Provider Address Phone Fax  ?[facility]  [address] [place], [place] [address] [phone]  ?[facility] [address] [place] [address] [phone]  ?[facility]  [address] [place] [address] [phone]  ?[facility]  [address] [place] [address] [phone]  ?[facility]  [address] [place] [address] [phone]  ?[facility]  [address] [place] [address] [phone]  ?[facility]  [address] [place] [address] [phone]  ?[facility]  [address] [place] [place] [address] [phone]  ?[facility]  [address] [place] [address] [phone]  ?[facility]  [address] [place], [place] [address] [phone]  ?[facility]  [address] [place], [place] [address] [phone]  ?[facility]      ? ? ?Situation ongoing,  CSW will follow up. ? ? ?Maryjean Ka, MSW, LCSWA ?08/28/2021  @ 12:00 AM ? ?

## 2021-08-27 NOTE — ED Notes (Signed)
Wanded by security 

## 2021-08-28 MED ORDER — ZIPRASIDONE MESYLATE 20 MG IM SOLR: 20.0000 mg | INTRAMUSCULAR | Status: DC | PRN

## 2021-08-28 MED ORDER — CITALOPRAM HYDROBROMIDE 10 MG PO TABS: 10.0000 mg | ORAL_TABLET | Freq: Every day | ORAL | 0 refills | Status: AC

## 2021-08-28 MED ORDER — RISPERIDONE 1 MG PO TABS: 1.0000 mg | ORAL_TABLET | Freq: Every day | ORAL | 0 refills | Status: AC

## 2021-08-28 NOTE — ED Notes (Signed)
Pt has been accepted to [facility] today the 29th after [facility], [phone] Dr. Rise Paganini  ?

## 2021-08-28 NOTE — ED Notes (Signed)
PT beginning to become irritable and agitated due to the fact that he does not want to go to the inpatient facility [facility]. Pt advised to calm down, stay in room and that his admission could be discussed further with the day staff. PT states that he has been to that facility 7 times and doesn't like the care he received.  ?

## 2021-08-28 NOTE — Progress Notes (Signed)
Pt was accepted to [facility] today 08/28/21 after 8:00am ? ?Pt meets inpatient criteria per Charmaine Downs, NP ? ?Attending Physician will be Dr. Rise Paganini ? ?Report can be called to: [phone]  ? ?Pt can arrive after 8:00am ? ?Care Team notified: Merrie Roof, RN ? ?[place] ?08/28/2021 @ 12:20 AM ? ?

## 2021-08-28 NOTE — ED Notes (Signed)
[place] [facility] called and will be accepting patient for inpatient referral . ?

## 2021-08-28 NOTE — ED Provider Notes (Addendum)
Emergency Medicine Observation Re-evaluation Note ? ?Roberto Osborn is a 51 y.o. male, seen on rounds today.  Pt initially presented to the ED for complaints of Medical Clearance ?Currently, the patient is restless, agitated, stating that he doesn't want to go to [facility]. Here voluntarily.  ? ?Physical Exam  ?BP (!) 135/93 (BP Location: Right Arm)   Pulse (!) 116   Temp 98.2 ?F (36.8 ?C) (Oral)   Resp 20   SpO2 99%  ?Physical Exam ?General: Pacing in the room ?Cardiac: well perfused ?Lungs: even and unlabored ?Psych: mildly agitated, pacing in the room.  Denies SI, HI, AVH. ? ?ED Course / MDM  ?EKG:  ? ?I have reviewed the labs performed to date as well as medications administered while in observation.  Recent changes in the last 24 hours include Evaluated by psychiatry. Per psychiatry notes, patient is homeless and endorsing SI in that setting. "He appeared disorganized and most of his answers and gestures were disorganized and not related to topic in discussion. Patient is not taking any psychotropic medications and have no outpatient provider. He,  again endorses suicide related to to being homeless, denied AVH and no mention of paranoia. We will admit this patient and resume Psychotropic medications.  He admitted to taking  Depakote long time ago and no longer taking.  His frequent visits to the ER warrants bringing him in for treatment and re introducing him to Mental health care." ? ?On my evaluation, the patient did not appear disorganized this morning.  He states "I have been to [facility] 6 times and they have never done anything for me."  He is currently recanting his SI at this time.  He denies any HI or AVH.  He does not appear to be disorganized.  He is requesting a shower and breakfast.  He states "just give me resources, I was here for help because I am on the streets."  I do not believe the patient is currently decompensated.  I believe he has capacity to make decisions for himself.  He is  currently recanting his SI.  He has no HI or AVH and while mildly agitated at the thought of going to [facility], he is overall calm and cooperative. The patient has multiple presentations on chart review for suicidal ideation and homelessness and malingering and presents with minimal risk for suicide. He is recanting his SI at this time. Medications started by psychiatry ordered for discharge with a plan to follow-up outpatient. ? ?Plan  ?Current plan is for Patient has a bed at [facility] but is no longer voluntarily willing to present for admission.  Upon reassessment, the patient does not appear to be disorganized.  He appears to be coherent, calm, cooperative, denying SI, HI, AVH.  I believe that the patient is overall safe for discharge at this time with a plan for continued outpatient resources.  TOC consult placed to assist with the patient's homelessness and for medication assistance. ? ? Roberto Osborn is not under involuntary commitment. ? ? ?  ? ? ?  ?Ernie Avena, MD ?08/28/21 [phone] ? ?

## 2021-08-28 NOTE — Care Management (Signed)
Patient in with SI then denied SI. Will be discharged with resources in patient instructions. Patient is not a candidate for MATCH medication assistance as he has insurance.  ?

## 2021-08-28 NOTE — Discharge Instructions (Addendum)
Schizophrenia/Mental Health Resources ?National Suicide Prevention Lifeline ?1-800-273-TALK (8255) ?http://www.suicidepreventionlifeline.org/ ??988?-Mental Health Crisis Line ? ?National Schizophrenia Foundation ?https://sczaction.org/ ?National Institute of Mental Health ?1-866-615-6464 ?nimhinfo@nih.gov (e-mail) ?www.nimh.nih.gov ?Schizophrenia & Psychosis Action Alliance ?800-493-2094 ?info@sczaction.org ?https://sczaction.org/  ?5 Additional Healthline identified ?Best online Schizophrenia Support Groups? ?Students with Psychosis ? ?Schizophrenia Spectrum Support ? ?Supportiv ($30 monthly fee)  ? ?NAMI Connection Recovery Support Group ? ?Schizophrenia Alliance ? ?Local Resources: ?Outpatient:  ? ?[facility]:  ?([phone] ?[address]   [place], [place]  [address] ?  ? ? ?https://www.[place].gov/services/[place]-behavioral-health-centers#contact ? ? ?[place]  ?[facility] at [place] ?[address] ?[place], [place] [address] ?[phone] ? ?[place]  ?[facility] at [place] ?[address] ?[place], [place]  [address] ?[phone] ?Local Resources ?(Inpatient) ? ?[place] [facility] ?[address]  ?[place], [place] [address] ?[phone] ? ?[facility] ?Behavioral Medicine Unit and Geriatric Psychiatric Unit   ?[address]  ?[place], [place] [address] ?[phone] ? ? ?[facility]MI [facility] ?https://naminwpiedmontnc.org/support-and-education/mental-health-education/ ? ?NAMI -[place] ?https://namigu[place]ford.org/ ? ?Community Housing and Support Resources: ? ?[facility] Ending Homelessness ?https://pehgc.org/ ? ?Interactive Resource Center ?https://www.interactiveresourcecenter.org/ ? ?[place] [facility] ?https://www.greensborourbanministry.org/ ? ?[facility] [place] (adult men's shelter) ?www.opendoorministrieshp.org ?[address] ?[place], [place] [address] ?336-885-0191 ?[facility] [place] [facility] ?[address] [place], [place] [address] ?[phone] ? ? ?VA's National Homeless Call Center ?1-877-4AID VET (1-877-424-3838) ? ?Veterans Crisis Line ?1-800-273-8255 press 1 ?Confidential chat-VeteransCrisisLine.net ?Or Text to 838255 ? ?United Way ?Call 211 or 1-888-892-1162 ?www.NC211.org ? ?Affordable Housing Resources in [place] ?nchousingsearch.com   ?1-877-428-8844 ? ?Shelters ? ?[place] Housing Coalition Housing Hotline ?336-691-9521 ?8:30am-5:30pm ?[facility] ?[address] ?[place], [place]  [address] ?336-886-5594 ?Male veterans 18+ with substance abuse issues ?Eligibility:  By Referral Only ? ?[facility] ?[address] ?, [place] [address] ?[phone] Ext. 347 ?Adult Men & Women ?Eligibility: Valid ID & Social Security Card ?www.greensborourbanministry.org ? ?[facility] ?[address] ?[place], [place]  [address] ?336-886-5594 ?Male veterans 18+ with substance abuse issues ?Eligibility:  By Referral Only ? ?[facility] ?[address] ?[place], [place]  [address] ?[phone] ?Single women 18+ without dependents ?Open 6pm-8am ?Eligibility:  Valid ID & Social Security Card ?Call to check availability  ?http://westendministries.org/leslieshouse.aspx ? ?[facility] ?[address] ?[place], [place]  [address] ?[phone] ?Male veterans 18+ with substance abuse/mental ?health issues ?Eligibility:  By Referral Only ? ?[facility] ?[address] ?[place], [place] [address] ?[phone] ?Call to check availability ?Males 18+ ?Eligibility: Valid ID & Social Security Card ?www.odm-hp.org ? ?[facility] [place] ?[address] ?[place], [place] [address] ?336-881-5400 ?Women 18+ & Families with children ?Eligibility:  Valid ID  & Criminal Background Check ?www.salvationarmycarolinas.org/commands/[place] ? ? ?Community Care of  (Care Management): [phone] ?[facility] [place] [facility]: Behavioral Health 24 Hour Phone Line 1-800-853-5163 ?NAMI Hotline 336-370-4264 ?NAMI [place] 919-788-0801 ? ?2-1-1 Referral Service ?United Way 211 ?Call 211 or 1-888-892-1162 ?www.NC211.org ?A free United Way, 24/7 telephone information and referral service to help link citizens who are seeking help with the community resources they need. In [place] and [place], just dial 211 from your phone. ?Mental Health Association in  336-373-1402 ?Support groups for anxiety, depression and bipolar disorder, schizophrenia,   family and friends, aftermath of suicide, and mental wellness for Latinos (in Spanish). ?Mental Health Association in [place] 336-883-7480 ?Offers support groups, [facility] program offers. Psychological, vocational, educational and other rehabilitation services to those who suffer from mental health illness of the 18 and up (5 days a week/5hours a day), offer out patient services like diagnostic evaluation, comprehensive clinical assessments, individual counseling, group therapy, psycho-educational workshops, referral to other specialists, referral to a psychiatrist for an evaluation for medication, consultation, and outreach/training. ?ADS (Alcohol and Drug Services) ?([phone] 336-333-6860 ?Substance Abuse education, prevention and treatment (detox, assessments, intensive outpatient and inpatient counseling and programs). ?[facility] ?[place] (336) 370-0195, [place] (336) [phone] ?Support groups for posttraumatic stress, depression, and schizophrenia? as well as day programs for individuals with severe mental illness. ?[facility] [place] ([phone]) [phone] ?[facility]-FREE-336-275-7896 ?[facility] 336-429-5600 or www.kellinfoundation.org ?Telehealth platform, Individual counseling across  the lifespan for both mental health and substance use, support groups, advocacy, case management, virtual villages, resource coordination. ?[facility]- 1-800-256-2452 ?[facility]-FREE-336-676-6840 or 1-800-853-5163 ?336-676-6849. Provides mental health services to all residents regardless of ability to pay. [address], [place]. [place] ?Domestic Violence Crisis Line ?[facility] ?Call for shelter and/or safety planning ?[facility] ?336-889-7273 (24/7) ?[facility] ?336-273-7273 (24/7) ? ?VA Homeless Hotline ?877-424-3838 ? ?Veterans Crisis Line ?1-800-273-8255 press 1 ?Confidential chat-VeteransCrisisLine.net ?Or Text to 838255 ? ?[facility] [place] [facility] ?[address] ?[place], [place] [address] ?336-899-1505 ?Hours: Mon-Fri. 8am-5pm ?[facility] Mobile Crisis Management ?Mobile crisis response for mental health, substance abuse or intellectual/developmental disabilities ?1-877-626-1772 ? ? ? ? ?Disclaimer: This resource list is subject to change at any time and is a starting point for resource identification as of 04/30/2021.  ? ?

## 2021-08-28 NOTE — ED Notes (Signed)
Patient DC d off unit to home per provider. Patient no s/s distress. DC information given to patient. Belongings given to patient but refused to take.  Patient ambulatory off unit, escorted by NT. Patient given bus pass for transportation.  ?

## 2021-08-29 ENCOUNTER — Emergency Department (HOSPITAL_COMMUNITY)
Admission: EM | Admit: 2021-08-29 | Discharge: 2021-08-29 | Disposition: A | Discharge status: Home / Self Care | Payer: Medicare PPO | Attending: Student | Admitting: Student

## 2021-08-29 ENCOUNTER — Other Ambulatory Visit: Payer: Self-pay

## 2021-08-29 DIAGNOSIS — R000 Tachycardia, unspecified: Secondary | ICD-10-CM | POA: Insufficient documentation

## 2021-08-29 DIAGNOSIS — F1090 Alcohol use, unspecified, uncomplicated: Secondary | ICD-10-CM | POA: Diagnosis not present

## 2021-08-29 DIAGNOSIS — Z5900 Homelessness unspecified: Secondary | ICD-10-CM | POA: Diagnosis not present

## 2021-08-29 DIAGNOSIS — M791 Myalgia, unspecified site: Secondary | ICD-10-CM | POA: Insufficient documentation

## 2021-08-29 LAB — CBG MONITORING, ED: Glucose-Capillary: 100 mg/dL — ABNORMAL HIGH (ref 70–99)

## 2021-08-29 NOTE — ED Triage Notes (Signed)
Pt to triage via [facility] from the side of the road.  Reports ETOH.  Refused for EMS to assess him and requested to come to [facility] ED per EMS.  Pt reports entire body hurts for 3 years.  Denies any other complaints. ?

## 2021-08-29 NOTE — ED Provider Notes (Signed)
?[facility] EMERGENCY DEPARTMENT ?Provider Note ? ? ?CSN: [id number] ?Arrival date & time: 08/29/21  1825 ? ?  ? ?History ? ?Chief Complaint  ?Patient presents with  ? Pain  ? ? ?Roberto Osborn is a 51 y.o. male. ? ?HPI ? ?Patient is a 50 year old homeless male who presents to the emergency department by EMS for diffuse body aches.  He was found on the side of the road and EMS was called by bystanders.  He refused any assessment by EMS.  He informed me he " cannot sleep outside on wet grass".  He reports being homeless for the past 3 years.  He reports diffuse whole body pain.  He does report EtOH use.  Denies any specific pain that brought him today.  Otherwise no other complaints. ? ?Home Medications ?Prior to Admission medications   ?Medication Sig Start Date End Date Taking? Authorizing Provider  ?citalopram (CELEXA) 10 MG tablet Take 1 tablet (10 mg total) by mouth daily. 08/28/21 09/27/21  Ernie Avena, MD  ?risperiDONE (RISPERDAL) 1 MG tablet Take 1 tablet (1 mg total) by mouth at bedtime. 08/28/21 09/27/21  Ernie Avena, MD  ?   ? ?Allergies    ?Patient has no known allergies.   ? ?Review of Systems   ?Review of Systems ? ?Physical Exam ?Updated Vital Signs ?BP (!) 141/85 (BP Location: Right Arm)   Pulse (!) 115   Temp 98.7 ?F (37.1 ?C) (Oral)   Resp 19   SpO2 100%  ?Physical Exam ?Constitutional:   ?   Comments:  poor hygiene  ?HENT:  ?   Head: Normocephalic.  ?   Nose: No congestion.  ?   Mouth/Throat:  ?   Pharynx: Oropharynx is clear.  ?Eyes:  ?   Extraocular Movements: Extraocular movements intact.  ?   Pupils: Pupils are equal, round, and reactive to light.  ?Cardiovascular:  ?   Rate and Rhythm: Tachycardia present.  ?Pulmonary:  ?   Effort: No respiratory distress.  ?   Breath sounds: No wheezing.  ?Chest:  ?   Chest wall: No tenderness.  ?Abdominal:  ?   Tenderness: There is no guarding or rebound.  ?Musculoskeletal:     ?   General: No tenderness. Normal range of motion.  ?    Cervical back: No rigidity or tenderness.  ?Skin: ?   General: Skin is warm.  ?Neurological:  ?   General: No focal deficit present.  ?   Mental Status: He is alert and oriented to person, place, and time. Mental status is at baseline.  ?   Sensory: No sensory deficit.  ? ? ?ED Results / Procedures / Treatments   ?Labs ?(all labs ordered are listed, but only abnormal results are displayed) ?Labs Reviewed  ?CBG MONITORING, ED - Abnormal; Notable for the following components:  ?    Result Value  ? Glucose-Capillary 100 (*)   ? All other components within normal limits  ? ? ?EKG ?None ? ?Radiology ?No results found. ? ?Procedures ?Procedures  ? ?Medications Ordered in ED ?Medications - No data to display ? ?ED Course/ Medical Decision Making/ A&P ?  ?                        ?Medical Decision Making ?Problems Addressed: ?Homelessness: acute illness or injury with systemic symptoms ? ? ?Patient is a 51 year old male who presents to the emergency department for 3 years of diffuse body pain.  He does not have specific complaints at this time.  His vital signs are remarkable for mild tachycardia.  Physical exam without acute abdomen or focal neurological deficit.  He is alert, oriented with GCS 15.  Patient blood glucose of 100.  No sign of hypoglycemia at this time.  He informed me that he wants to get some sleep and did want to sleep outside.  I believe patient is stable for discharge.  No acute intervention required at this time.  Will provide resource.  Strict return precaution has been discussed. ? ?Final Clinical Impression(s) / ED Diagnoses ?Final diagnoses:  ?Homelessness  ? ? ?Rx / DC Orders ?ED Discharge Orders   ? ? None  ? ?  ? ? ?  ?Jari Sportsman, MD ?08/29/21 2231 ? ?  ?Glendora Score, MD ?08/29/21 2332 ? ?

## 2021-08-31 ENCOUNTER — Encounter (HOSPITAL_COMMUNITY): Payer: Self-pay | Admitting: Registered Nurse

## 2021-08-31 ENCOUNTER — Ambulatory Visit (INDEPENDENT_AMBULATORY_CARE_PROVIDER_SITE_OTHER)
Admission: EM | Admit: 2021-08-31 | Discharge: 2021-09-01 | Disposition: A | Discharge status: Home / Self Care | Payer: Medicare PPO | Source: Home / Self Care

## 2021-08-31 DIAGNOSIS — Z20822 Contact with and (suspected) exposure to covid-19: Secondary | ICD-10-CM | POA: Insufficient documentation

## 2021-08-31 DIAGNOSIS — R4587 Impulsiveness: Secondary | ICD-10-CM | POA: Insufficient documentation

## 2021-08-31 DIAGNOSIS — F29 Unspecified psychosis not due to a substance or known physiological condition: Secondary | ICD-10-CM

## 2021-08-31 DIAGNOSIS — F332 Major depressive disorder, recurrent severe without psychotic features: Secondary | ICD-10-CM

## 2021-08-31 DIAGNOSIS — G4700 Insomnia, unspecified: Secondary | ICD-10-CM | POA: Insufficient documentation

## 2021-08-31 DIAGNOSIS — R450 Nervousness: Secondary | ICD-10-CM | POA: Insufficient documentation

## 2021-08-31 DIAGNOSIS — R45851 Suicidal ideations: Secondary | ICD-10-CM | POA: Insufficient documentation

## 2021-08-31 DIAGNOSIS — R451 Restlessness and agitation: Secondary | ICD-10-CM | POA: Insufficient documentation

## 2021-08-31 DIAGNOSIS — R454 Irritability and anger: Secondary | ICD-10-CM | POA: Insufficient documentation

## 2021-08-31 DIAGNOSIS — G8929 Other chronic pain: Secondary | ICD-10-CM | POA: Diagnosis not present

## 2021-08-31 DIAGNOSIS — Z5900 Homelessness unspecified: Secondary | ICD-10-CM

## 2021-08-31 DIAGNOSIS — F209 Schizophrenia, unspecified: Secondary | ICD-10-CM | POA: Insufficient documentation

## 2021-08-31 DIAGNOSIS — R41844 Frontal lobe and executive function deficit: Secondary | ICD-10-CM | POA: Insufficient documentation

## 2021-08-31 DIAGNOSIS — M791 Myalgia, unspecified site: Secondary | ICD-10-CM | POA: Insufficient documentation

## 2021-08-31 LAB — CBC WITH DIFFERENTIAL/PLATELET
Abs Immature Granulocytes: 0.01 10*3/uL (ref 0.00–0.07)
Basophils Absolute: 0 10*3/uL (ref 0.0–0.1)
Basophils Relative: 1 %
Eosinophils Absolute: 0.1 10*3/uL (ref 0.0–0.5)
Eosinophils Relative: 1 %
HCT: 39.9 % (ref 39.0–52.0)
Hemoglobin: 13.4 g/dL (ref 13.0–17.0)
Immature Granulocytes: 0 %
Lymphocytes Relative: 25 %
Lymphs Abs: 1.6 10*3/uL (ref 0.7–4.0)
MCH: 31 pg (ref 26.0–34.0)
MCHC: 33.6 g/dL (ref 30.0–36.0)
MCV: 92.4 fL (ref 80.0–100.0)
Monocytes Absolute: 0.6 10*3/uL (ref 0.1–1.0)
Monocytes Relative: 9 %
Neutro Abs: 4 10*3/uL (ref 1.7–7.7)
Neutrophils Relative %: 64 %
Platelets: 230 10*3/uL (ref 150–400)
RBC: 4.32 MIL/uL (ref 4.22–5.81)
RDW: 13.6 % (ref 11.5–15.5)
WBC: 6.3 10*3/uL (ref 4.0–10.5)
nRBC: 0 % (ref 0.0–0.2)

## 2021-08-31 LAB — COMPREHENSIVE METABOLIC PANEL
ALT: 16 U/L (ref 0–44)
AST: 20 U/L (ref 15–41)
Albumin: 3.9 g/dL (ref 3.5–5.0)
Alkaline Phosphatase: 76 U/L (ref 38–126)
Anion gap: 7 (ref 5–15)
BUN: 17 mg/dL (ref 6–20)
CO2: 26 mmol/L (ref 22–32)
Calcium: 9.3 mg/dL (ref 8.9–10.3)
Chloride: 107 mmol/L (ref 98–111)
Creatinine, Ser: 0.73 mg/dL (ref 0.61–1.24)
GFR, Estimated: >60 mL/min (ref >60)
Glucose, Bld: 105 mg/dL — ABNORMAL HIGH (ref 70–99)
Potassium: 4.7 mmol/L (ref 3.5–5.1)
Sodium: 140 mmol/L (ref 135–145)
Total Bilirubin: 0.7 mg/dL (ref 0.3–1.2)
Total Protein: 5.7 g/dL — ABNORMAL LOW (ref 6.5–8.1)

## 2021-08-31 LAB — POCT URINE DRUG SCREEN - MANUAL ENTRY (I-SCREEN)
POC Amphetamine UR: NOT DETECTED
POC Buprenorphine (BUP): NOT DETECTED
POC Cocaine UR: NOT DETECTED
POC Marijuana UR: NOT DETECTED
POC Methadone UR: NOT DETECTED
POC Methamphetamine UR: NOT DETECTED
POC Morphine: NOT DETECTED
POC Oxazepam (BZO): NOT DETECTED
POC Oxycodone UR: NOT DETECTED
POC Secobarbital (BAR): NOT DETECTED

## 2021-08-31 LAB — POC SARS CORONAVIRUS 2 AG: SARSCOV2ONAVIRUS 2 AG: NEGATIVE

## 2021-08-31 LAB — ETHANOL: Alcohol, Ethyl (B): <10 mg/dL (ref <10)

## 2021-08-31 LAB — RESP PANEL BY RT-PCR (FLU A&B, COVID) ARPGX2
Influenza A by PCR: NEGATIVE
Influenza B by PCR: NEGATIVE
SARS Coronavirus 2 by RT PCR: NEGATIVE

## 2021-08-31 MED ORDER — ACETAMINOPHEN 325 MG PO TABS: 650.0000 mg | ORAL_TABLET | Freq: Four times a day (QID) | ORAL | Status: DC | PRN

## 2021-08-31 MED ORDER — HYDROXYZINE HCL 25 MG PO TABS: 25.0000 mg | ORAL_TABLET | Freq: Three times a day (TID) | ORAL | Status: DC | PRN

## 2021-08-31 MED ORDER — MAGNESIUM HYDROXIDE 400 MG/5ML PO SUSP: 30.0000 mL | Freq: Every day | ORAL | Status: DC | PRN

## 2021-08-31 MED ORDER — ALUM & MAG HYDROXIDE-SIMETH 200-200-20 MG/5ML PO SUSP: 30.0000 mL | ORAL | Status: DC | PRN

## 2021-08-31 MED ORDER — TRAZODONE HCL 50 MG PO TABS: 50.0000 mg | ORAL_TABLET | Freq: Every evening | ORAL | Status: DC | PRN

## 2021-08-31 MED ORDER — LORAZEPAM 2 MG/ML IJ SOLN
1.0000 mg | Freq: Once | INTRAMUSCULAR | Status: AC | PRN
  Administered 2021-08-31: 1 mg via INTRAMUSCULAR
  Filled 2021-08-31: qty 1

## 2021-08-31 MED ORDER — CITALOPRAM HYDROBROMIDE 10 MG PO TABS: 10.0000 mg | ORAL_TABLET | Freq: Every day | ORAL | Status: DC

## 2021-08-31 MED ORDER — HALOPERIDOL LACTATE 5 MG/ML IJ SOLN
5.0000 mg | Freq: Once | INTRAMUSCULAR | Status: AC | PRN
  Administered 2021-08-31: 5 mg via INTRAMUSCULAR
  Filled 2021-08-31: qty 1

## 2021-08-31 MED ORDER — OLANZAPINE 10 MG PO TBDP: 10.0000 mg | ORAL_TABLET | Freq: Every day | ORAL | Status: DC

## 2021-08-31 MED ORDER — DIPHENHYDRAMINE HCL 50 MG/ML IJ SOLN
50.0000 mg | Freq: Once | INTRAMUSCULAR | Status: AC | PRN
  Administered 2021-08-31: 50 mg via INTRAVENOUS
  Filled 2021-08-31: qty 1

## 2021-08-31 NOTE — ED Notes (Signed)
PT has been brought on the unit and familiarized with the unit. Pt has taken a shower and is lying in bed quietly. ?

## 2021-08-31 NOTE — ED Notes (Signed)
Pt sleeping@this time. Breathing even and unlabored. Will continue to monitor for safety 

## 2021-08-31 NOTE — ED Provider Notes (Addendum)
[facility] Continuous Assessment Admission H&P ? ?Date: 08/31/21 ?Patient Name: Roberto Osborn ?MRN: [id number] ?Chief Complaint: No chief complaint on file. ?   ? ?Diagnoses:  ?Final diagnoses:  ?None  ? ? ?HPI: Roberto Osborn, 51 y.o., male patient is a 51 year old male that presents to [facility] voluntary by [employer] with complaints of ongoing suicidal ideation.   ?Patient seen face to face by this provider, consulted with Dr. Ernie Hew; and chart reviewed on 08/31/21.  On evaluation Roberto Osborn reports he is hearing voices but no elaboration on what the voices are saying.  Patient then begins to act as if he is hearing voices.  Patient asked what it was that he needed help with and he states ?I'm in pain.  I'm sorry I hurt all over? and he holds up his hands and points to his third (middle) finger over and over; he then stretches out legs and waggles his feet.  Patient informed that pain is not treated at this facility and that here we address mental health.  Patient denies a prior psychiatric diagnosis and states that he doesn't want any mediation because none of them work for him.  Patient asked if he has a history of schizophrenia or schizoaffective disorder, and he states ?No.?  Patient asked if he was using any illicit drugs and he again states ?No.?  Patient asked if he was willing to take Seroquel, Abilify, Depakote, or Zyprexa and he states he is not taking any mediations because they don't work for him.  Patient informed if he has no mental health problem and unwilling to take medications what is it he needed help with?  He states ?This is my last resort.  I've been all over town and if you put me out, I'm [place] catch a bus out of town.?  Patient asked if he was having suicidal thoughts and he states ?I should this is my last resort been everywhere.  I'm going to get on a bus and get out of town.?  Patient become irritated asking what this provider will prescribed; different  medications named off again and patient states he doesn't want medications.? ?During evaluation Roberto Osborn is alert/oriented x 4; easily agitated but somewhat cooperative.  He appears to be responding to auditory hallucinations but states that he isn't and then that he is.   Patient denies suicidal ideation and the he endorses it.  He does deny homicidal ideation and paranoia.    ? ?PHQ 2-9:   ?[place] [facility] from 08/29/2021 in [place] [facility] from 08/27/2021 in [place] [facility]PT ED from 08/18/2021 in [place] [facility]  ?C-SSRS RISK CATEGORY Error: Q3, 4, or 5 should not be populated when Q2 is No Error: Q3, 4, or 5 should not be populated when Q2 is No No Risk  ? ?  ?  ? ?Total Time spent with patient: 45 minutes ? ?Musculoskeletal  ?Strength & Muscle Tone: within normal limits ?Gait & Station: normal ?Patient leans: N/A ? ?Psychiatric Specialty Exam  ?Presentation ?General Appearance: Disheveled ? ?Eye Contact:Good ? ?Speech:Clear and Coherent ? ?Speech Volume:Decreased (speaking in a low volume but when asked question several time he yells the answer out) ? ?Handedness:Right ? ? ?Mood and Affect  ?Mood:Anxious; Irritable; Labile ? ?Affect:Labile ? ? ?Thought Process  ?Thought Processes:Coherent ? ?Descriptions of Associations:Circumstantial ? ?Orientation:Full (Time, Place and Person) ? ?Thought Content:Logical ? Diagnosis of Schizophrenia or Schizoaffective disorder in  past: Yes ? Duration of Psychotic Symptoms: Greater than six months ? ?Hallucinations:Hallucinations: Other (comment) (Patient denies auditory hallucinations to this provider but reported to counselor that he was hearing voices.  He appears to be responding to auditory hallucinations one minute and not the next) ? ?Ideas of Reference:None ? ?Suicidal Thoughts:Suicidal Thoughts: No (He states that he is and then he denies) ? ?Homicidal Thoughts:Homicidal  Thoughts: No ? ? ?Sensorium  ?Memory:Immediate Fair; Recent Fair ? ?Judgment:Fair ? ?Insight:Shallow ? ? ?Executive Functions  ?Concentration:Fair ? ?Attention Span:Poor ? ?Recall:Fair ? ?[place] ? ?Language:Fair ? ? ?Psychomotor Activity  ?Psychomotor Activity:Psychomotor Activity: Restlessness ? ? ?Assets  ?Assets:Desire for Improvement ? ? ?Sleep  ?Sleep:Sleep: Fair ? ? ?Nutritional Assessment (For OBS and FBC admissions only) ?Has the patient had a weight loss or gain of 10 pounds or more in the last 3 months?: No ?Has the patient had a decrease in food intake/or appetite?: No ?Does the patient have dental problems?: No ?Does the patient have eating habits or behaviors that may be indicators of an eating disorder including binging or inducing vomiting?: No ?Has the patient recently lost weight without trying?: 0 ?Has the patient been eating poorly because of a decreased appetite?: 0 ?Malnutrition Screening Tool Score: 0 ? ? ? ?Physical Exam ?Vitals and nursing note reviewed. Exam conducted with a chaperone present.  ?Constitutional:   ?   General: He is not in acute distress. ?   Appearance: Normal appearance. He is not ill-appearing.  ?Cardiovascular:  ?   Rate and Rhythm: Normal rate.  ?Pulmonary:  ?   Effort: Pulmonary effort is normal.  ?Musculoskeletal:     ?   General: Normal range of motion.  ?Skin: ?   General: Skin is warm and dry.  ?Neurological:  ?   Mental Status: He is alert and oriented to person, place, and time.  ?Psychiatric:     ?   Attention and Perception: He perceives auditory hallucinations.     ?   Mood and Affect: Mood is anxious. Affect is labile.     ?   Behavior: Behavior is agitated.     ?   Thought Content: Thought content is not paranoid or delusional. Thought content does not include homicidal ideation. Suicidal: Passive.    ?   Judgment: Judgment is impulsive.  ? ?Review of Systems  ?Cardiovascular:   ?     Denies chest pain but states "I can pop my breast plate"  Referring to hearing his bones pop in chest area ?  ?Musculoskeletal:  Positive for myalgias.  ?Psychiatric/Behavioral:  Positive for depression. Hallucinations: Denies. Substance abuse: Denies. Suicidal ideas: Passive.The patient is nervous/anxious and has insomnia.   ? ?Blood pressure 121/66, pulse 100, temperature 98 ?F (36.7 ?C), temperature source Oral, resp. rate 19, SpO2 100 %. There is no height or weight on file to calculate BMI. ? ?Past Psychiatric History: Denies prior psychiatric history.  Homelessness, malingering, MDD  ? ?Is the patient at risk to self? Yes  ?Has the patient been a risk to self in the past 6 months? No .    ?Has the patient been a risk to self within the distant past? No   ?Is the patient a risk to others? No   ?Has the patient been a risk to others in the past 6 months? No   ?Has the patient been a risk to others within the distant past? No  ? ?Past Medical History:  ?Past Medical History:  ?  Diagnosis Date  ? Homeless   ? History reviewed. No pertinent surgical history. ? ?Family History: History reviewed. No pertinent family history. ? ?Social History:  ?Social History  ? ?Socioeconomic History  ? Marital status: Single  ?  Spouse name: Not on file  ? Number of children: Not on file  ? Years of education: Not on file  ? Highest education level: Not on file  ?Occupational History  ? Not on file  ?Tobacco Use  ? Smoking status: Every Day  ?  Types: Cigarettes  ? Smokeless tobacco: Not on file  ?Substance and Sexual Activity  ? Alcohol use: Not on file  ? Drug use: Not on file  ? Sexual activity: Not on file  ?Other Topics Concern  ? Not on file  ?Social History Narrative  ? Not on file  ? ?Social Determinants of Health  ? ?Financial Resource Strain: Not on file  ?Food Insecurity: Not on file  ?Transportation Needs: Not on file  ?Physical Activity: Not on file  ?Stress: Not on file  ?Social Connections: Not on file  ?Intimate Partner Violence: Not on file  ? ? ?SDOH:  ?SDOH Screenings   ? ?Alcohol Screen: Not on file  ?Depression (PHQ2-9): Low Risk   ? PHQ-2 Score: 1  ?Financial Resource Strain: Not on file  ?Food Insecurity: Not on file  ?Housing: Not on file  ?Physical Activity: Not on file  ?S

## 2021-08-31 NOTE — BH Assessment (Signed)
Patient is a 51 year old male that presents to [facility] voluntary this date by [employer] reporting ongoing S/I although is vague in reference to a plan. Patient denies any H/I or VH although reports active AH stating he hears voices telling him to self harm although again is vague in reference to content. Patient denies any SA issues this date. Patient per [employer] was transported to [facility] this date from [employer] after reporting he "didn't want to stay there." Per notes patient was seen on 4/28 reporting at that time he had been homeless for two years and was "tired of staying on the streets." Patient has a history of Schizophrenia although denies he is on any medications this date. Patient denies any current SA issues. Patient per NP's note of 4/28 required an inpatient admission at that time and was discharged on 4/29 because he declined to be admitted to that facility. Per notes patient has been admitted over 7 times to [facility] and states "they do nothing for me." Patient was discharged on 4/29 although declined any DC/follow up information. Patient presented back on 4/30 to [facility] stating he "could not sleep on wet grass." Patient has a history of ETOH use although states he has not used in the last 24 hours. Patient presents as disorganized and difficult to redirect.      ?

## 2021-08-31 NOTE — BH Assessment (Addendum)
Comprehensive Clinical Assessment (CCA) Note ? ?08/31/2021 ?[name] ?[id number] ?DISPOSITION: Per [place] NP patient will be monitored in continuous observation.  ? ?The patient demonstrates the following risk factors for suicide: Chronic risk factors for suicide include: psychiatric disorder of SCHIZOPHRENIA . Acute risk factors for suicide include:  homeless . Protective factors for this patient include: coping skills. Considering these factors, the overall suicide risk at this point appears to be high. Patient is not appropriate for outpatient follow up.  ? ?Patient is a 51 year old male that presents to [facility] voluntary this date by [employer] reporting ongoing S/I although is vague in reference to a plan. Patient denies any H/I or VH although reports active AH stating he hears voices telling him to self harm although again is vague in reference to content. Patient denies any SA issues this date. Patient per [employer] was transported to [facility] this date from [employer] after reporting he "didn't want to stay there." Per notes patient was seen on 4/28 reporting at that time he had been homeless for two years and was "tired of staying on the streets." Patient has a history of Schizophrenia although denies he is on any medications this date. Patient denies any current SA issues. Patient per NP's note of 4/28 required an inpatient admission at that time and was discharged on 4/29 because he declined to be admitted to that facility. Per notes patient has been admitted over 7 times to [facility] and states "they do nothing for me." Patient was discharged on 4/29 although declined any DC/follow up information. Patient presented back on 4/30 to [facility] stating he "could not sleep on wet grass." Patient has a history of ETOH use although states he has not used in the last 24 hours. Patient presents as disorganized and difficult to redirect.      ? ?Patient will not respond to orientation questions. Patient is observed to be  disorganized and difficult to redirect. Patient renders limited history. Patient's mood is agitated with affect congruent. Patient's memory appears to be impaired with thoughts disorganized. It is unclear if patient is responding  to internal stimuli.   ? ?Chief Complaint: No chief complaint on file. ? ?Visit Diagnosis: Schizoaffective Disorder   ? ? ?CCA Screening, Triage and Referral (STR) ? ?Patient Reported Information ?How did you hear about [place]? Self ? ?What Is the Reason for Your Visit/Call Today? Pt presents disorganized with ongoing S/I ? ?How Long Has This Been Causing You Problems? 1 wk - 1 month ? ?What Do You Feel Would Help You the Most Today? -- (Pt states he "just wants housing") ? ? ?Have You Recently Had Any Thoughts About Hurting Yourself? Yes ? ?Are You Planning to Commit Suicide/Harm Yourself At This time? No ? ? ?Have you Recently Had Thoughts About [place]? No ? ?Are You Planning to Harm Someone at This Time? No ? ?Explanation: No data recorded ? ?Have You Used Any Alcohol or Drugs in the Past 24 Hours? No ? ?How Long Ago Did You Use Drugs or Alcohol? No data recorded ?What Did You Use and How Much? No data recorded ? ?Do You Currently Have a Therapist/Psychiatrist? No ? ?Name of Therapist/Psychiatrist: No data recorded ? ?Have You Been Recently Discharged From Any Office Practice or Programs? No ? ?Explanation of Discharge From Practice/Program: Discharged from [facility] earlier today. ? ? ?  ?CCA Screening Triage Referral Assessment ?Type of Contact: Face-to-Face ? ?Telemedicine Service Delivery:   ?Is this Initial or  Reassessment? Initial Assessment ? ?Date Telepsych consult ordered in [facility]:  02/24/21 ? ?Time Telepsych consult ordered in [facility]:  2112 ? ?Location of Assessment: [facility] Assessment Services ? ?Provider Location: [facility] Assessment Services ? ? ?Collateral Involvement: [employer] who is present ? ? ?Does Patient Have a Stage manager Guardian? No data recorded ?Name and  Contact of Legal Guardian: No data recorded ?If Minor and Not Living with Parent(s), Who has Custody? NA ? ?Is CPS involved or ever been involved? Never ? ?Is APS involved or ever been involved? Never ? ? ?Patient Determined To Be At Risk for Harm To Self or Others Based on Review of Patient Reported Information or Presenting Complaint? Yes, for Self-Harm ? ?Method: No data recorded ?Availability of Means: No data recorded ?Intent: No data recorded ?Notification Required: No data recorded ?Additional Information for Danger to Others Potential: No data recorded ?Additional Comments for Danger to Others Potential: No data recorded ?Are There Guns or Other Weapons in [place]? No data recorded ?Types of Guns/Weapons: No data recorded ?Are These Weapons Safely Secured?                            No data recorded ?Who Could Verify You Are Able To Have These Secured: No data recorded ?Do You Have any Outstanding Charges, Pending Court Dates, Parole/Probation? No data recorded ?Contacted To Inform of Risk of Harm To Self or Others: Other: Comment (NA) ? ? ? ?Does Patient Present under Involuntary Commitment? No ? ?IVC Papers Initial File Date: No data recorded ? ?[place] of Residence: Kathleen Argue ? ? ?Patient Currently Receiving the Following Services: Not Receiving Services ? ? ?Determination of Need: Urgent (48 hours) ? ? ?Options For Referral: Outpatient Therapy ? ? ? ? ?CCA Biopsychosocial ?Patient Reported Schizophrenia/Schizoaffective Diagnosis in Past: Yes ? ? ?Strengths: pt is willing to participate in treatment ? ? ?Mental Health Symptoms ?Depression:   ?Worthlessness; Fatigue; Change in energy/activity ?  ?Duration of Depressive symptoms:  ?Duration of Depressive Symptoms: Greater than two weeks ?  ?Mania:   ?None ?  ?Anxiety:    ?Difficulty concentrating ?  ?Psychosis:   ?Hallucinations ?  ?Duration of Psychotic symptoms:  ?Duration of Psychotic Symptoms: Greater than six months ?  ?Trauma:   ?None ?  ?Obsessions:    ?None ?  ?Compulsions:   ?None ?  ?Inattention:   ?N/A ?  ?Hyperactivity/Impulsivity:   ?N/A ?  ?Oppositional/Defiant Behaviors:   ?N/A ?  ?Emotional Irregularity:   ?Chronic feelings of emptiness ?  ?Other Mood/Personality Symptoms:   ?NA ?  ? ?Mental Status Exam ?Appearance and self-care  ?Stature:   ?Average ?  ?Weight:   ?Average weight ?  ?Clothing:   ?Disheveled ?  ?Grooming:   ?Neglected ?  ?Cosmetic use:   ?None ?  ?Posture/gait:   ?Normal ?  ?Motor activity:   ?Not Remarkable ?  ?Sensorium  ?Attention:   ?Distractible ?  ?Concentration:   ?Anxiety interferes ?  ?Orientation:   ?Object; Person; Place; Situation ?  ?Recall/memory:   ?Normal ?  ?Affect and Mood  ?Affect:   ?Appropriate ?  ?Mood:   ?Anxious; Depressed ?  ?Relating  ?Eye contact:   ?Normal ?  ?Facial expression:   ?Anxious ?  ?Attitude toward examiner:   ?Cooperative ?  ?Thought and Language  ?Speech flow:  ?Blocked; Flight of Ideas ?  ?Thought content:   ?Persecutions ?  ?Preoccupation:   ?None ?  ?  Hallucinations:   ?Auditory; Visual ?  ?Organization:  No data recorded  ?Executive Functions  ?Fund of Knowledge:   ?Average ?  ?Intelligence:   ?Average ?  ?Abstraction:   ?Concrete ?  ?Judgement:   ?Poor ?  ?Reality Testing:   ?Distorted ?  ?Insight:   ?Lacking ?  ?Decision Making:   ?Vacilates ?  ?Social Functioning  ?Social Maturity:   ?Impulsive ?  ?Social Judgement:   ?"Street Smart" ?  ?Stress  ?Stressors:   ?Housing; Relationship ?  ?Coping Ability:   ?Deficient supports; Overwhelmed; Exhausted ?  ?Skill Deficits:   ?Decision making; Self-control; Self-care ?  ?Supports:   ?Support needed ?  ? ? ?Religion: ?Religion/Spirituality ?Are You A Religious Person?:  (Unable to answer) ?How Might This Affect Treatment?: NA ? ?Leisure/Recreation: ?Leisure / Recreation ?Do You Have Hobbies?: No ? ?Exercise/Diet: ?Exercise/Diet ?Do You Exercise?: No ?Have You Gained or Lost A Significant Amount of Weight in the Past Six Months?: No ?Do You Follow a  Special Diet?: No ?Do You Have Any Trouble Sleeping?: No ? ? ?CCA Employment/Education ?Employment/Work Situation: ?Employment / Work Situation ?Employment Situation: On disability ?Why is Patient on Disability:

## 2021-09-01 ENCOUNTER — Encounter (HOSPITAL_COMMUNITY): Payer: Self-pay

## 2021-09-01 ENCOUNTER — Emergency Department (HOSPITAL_COMMUNITY)
Admission: EM | Admit: 2021-09-01 | Discharge: 2021-09-01 | Disposition: A | Discharge status: Home / Self Care | Payer: Medicare PPO | Attending: Emergency Medicine | Admitting: Emergency Medicine

## 2021-09-01 DIAGNOSIS — G8929 Other chronic pain: Secondary | ICD-10-CM | POA: Insufficient documentation

## 2021-09-01 DIAGNOSIS — Z5900 Homelessness unspecified: Secondary | ICD-10-CM | POA: Insufficient documentation

## 2021-09-01 NOTE — ED Notes (Signed)
Pt has been called X2 in the lobby within 15 min apart. No answer.  ?

## 2021-09-01 NOTE — Progress Notes (Signed)
Patient profane cursing at staff and demanding that "you do your job."  Patient screaming and yelling slammed the bathroom door.  Spitting on floor and throwing cups from breakfast.  Security called and arrived on unit.  Patient disorganized and hostile.  Demanding immediate discharge.  DR. Demetrius Charity called and arrived on unit.  Patient evaluated by DR Renaldo Fiddler and patient denied avh shi or plan.  Patient discharged.  Security walked patient to back sally port as he was disruptive to unit.  Patient used the bathroom and was given AVS which he threw away and a bus pass.  Patient walked out of facility.   ?

## 2021-09-01 NOTE — ED Notes (Signed)
Patient acting bizarre aeb beating himself in the head repeatedly. Patient talking to himself. Patient walking back and forth and around unit speaking loudly aeb talking to himself. Patient was given breakfast in which he threw away. Patient causing disruption aeb use of expletives, talking very loud and disrespecting staff.  Security was called to the unit. Patient is in bathroom talking loud to himself. Patient requested to leave facility. Patient is safe on unit with continued monitoring. ?

## 2021-09-01 NOTE — ED Provider Notes (Signed)
FBC/OBS ASAP Discharge Summary ? ?Date and Time: 09/01/2021 11:42 AM  ?Name: Roberto Osborn  ?MRN:  [id number]  ? ?Discharge Diagnoses:  ?Final diagnoses:  ?Homelessness unspecified  ?Major depressive disorder, recurrent severe without psychotic features (HCC)  ?Psychosis, unspecified psychosis type (HCC)  ? ? ?Subjective:  ?Roberto Osborn, 51 y.o., male patient is a 51 year old male that presents to [facility] voluntary by [facility] with complaints of ongoing suicidal ideation.   ? ?Stay Summary:  ?Patient presented on 5/2 to [facility].  ?Per admission note ?"On evaluation Roberto Osborn reports he is hearing voices but no elaboration on what the voices are saying.  Patient then begins to act as if he is hearing voices.  Patient asked what it was that he needed help with and he states ?I'm in pain.  I'm sorry I hurt all over? and he holds up his hands and points to his third (middle) finger over and over; he then stretches out legs and waggles his feet.  Patient informed that pain is not treated at this facility and that here we address mental health.  Patient denies a prior psychiatric diagnosis and states that he doesn't want any mediation because none of them work for him.  Patient asked if he has a history of schizophrenia or schizoaffective disorder, and he states ?No.?  Patient asked if he was using any illicit drugs and he again states ?No.?  Patient asked if he was willing to take Seroquel, Abilify, Depakote, or Zyprexa and he states he is not taking any mediations because they don't work for him.  Patient informed if he has no mental health problem and unwilling to take medications what is it he needed help with?  He states ?This is my last resort.  I've been all over town and if you put me out, I'm [place] catch a bus out of town.?  Patient asked if he was having suicidal thoughts and he states ?I should this is my last resort been everywhere.  I'm going to get on a bus and get out of town.?  Patient become  irritated asking what this provider will prescribed; different medications named off again and patient states he doesn't want medications.?" ? ?On 5/3 patient was irritable,yelling, and demanding to be discharged.  When asked he reports no SI, HI, or AVH.  He stated he did not want any medications and wanted to leave.  He then demanded a bus pass.  Discussed that he would be discharged and provided with a bus pass.  He was agreeable to this and sat down on his bed awaiting discharge.  He reports no concerns at this time restating he wanted to be discharged.  He ws discharged with a bus pass. ? ? ?Total Time spent with patient: 20 minutes ? ?Past Psychiatric History: Denies prior psychiatric history.  Homelessness, malingering, MDD  ?Past Medical History:  ?Past Medical History:  ?Diagnosis Date  ? Homeless   ? History reviewed. No pertinent surgical history. ?Family History: History reviewed. No pertinent family history. ?Family Psychiatric History: Reports none ?Social History:  ?Social History  ? ?Substance and Sexual Activity  ?Alcohol Use None  ?   ?Social History  ? ?Substance and Sexual Activity  ?Drug Use Not on file  ?  ?Social History  ? ?Socioeconomic History  ? Marital status: Single  ?  Spouse name: Not on file  ? Number of children: Not on file  ? Years of education: Not on file  ?  Highest education level: Not on file  ?Occupational History  ? Not on file  ?Tobacco Use  ? Smoking status: Every Day  ?  Types: Cigarettes  ? Smokeless tobacco: Not on file  ?Substance and Sexual Activity  ? Alcohol use: Not on file  ? Drug use: Not on file  ? Sexual activity: Not on file  ?Other Topics Concern  ? Not on file  ?Social History Narrative  ? Not on file  ? ?Social Determinants of Health  ? ?Financial Resource Strain: Not on file  ?Food Insecurity: Not on file  ?Transportation Needs: Not on file  ?Physical Activity: Not on file  ?Stress: Not on file  ?Social Connections: Not on file  ? ?SDOH:  ?SDOH Screenings   ? ?Alcohol Screen: Not on file  ?Depression (PHQ2-9): Low Risk   ? PHQ-2 Score: 1  ?Financial Resource Strain: Not on file  ?Food Insecurity: Not on file  ?Housing: Not on file  ?Physical Activity: Not on file  ?Social Connections: Not on file  ?Stress: Not on file  ?Tobacco Use: High Risk  ? Smoking Tobacco Use: Every Day  ? Smokeless Tobacco Use: Unknown  ? Passive Exposure: Not on file  ?Transportation Needs: Not on file  ? ? ?Tobacco Cessation:  A prescription for an FDA-approved tobacco cessation medication was offered at discharge and the patient refused ? ?Current Medications:  ?Current Facility-Administered Medications  ?Medication Dose Route Frequency Provider Last Rate Last Admin  ? acetaminophen (TYLENOL) tablet 650 mg  650 mg Oral Q6H PRN Rankin, Shuvon B, NP      ? alum & mag hydroxide-simeth (MAALOX/MYLANTA) 200-200-20 MG/5ML suspension 30 mL  30 mL Oral Q4H PRN Rankin, Shuvon B, NP      ? citalopram (CELEXA) tablet 10 mg  10 mg Oral Daily Rankin, Shuvon B, NP      ? hydrOXYzine (ATARAX) tablet 25 mg  25 mg Oral TID PRN Rankin, Shuvon B, NP      ? magnesium hydroxide (MILK OF MAGNESIA) suspension 30 mL  30 mL Oral Daily PRN Rankin, Shuvon B, NP      ? OLANZapine zydis (ZYPREXA) disintegrating tablet 10 mg  10 mg Oral QHS Rankin, Shuvon B, NP      ? traZODone (DESYREL) tablet 50 mg  50 mg Oral QHS PRN Rankin, Shuvon B, NP      ? ?Current Outpatient Medications  ?Medication Sig Dispense Refill  ? citalopram (CELEXA) 10 MG tablet Take 1 tablet (10 mg total) by mouth daily. 30 tablet 0  ? risperiDONE (RISPERDAL) 1 MG tablet Take 1 tablet (1 mg total) by mouth at bedtime. 30 tablet 0  ? ? ?PTA Medications: (Not in a hospital admission) ? ? ?Musculoskeletal  ?Strength & Muscle Tone: within normal limits ?Gait & Station: normal ?Patient leans: N/A ? ?Psychiatric Specialty Exam  ?Presentation  ?General Appearance: Disheveled ? ?Eye Contact:Good ? ?Speech:Clear and Coherent; Normal Rate ? ?Speech Volume:Normal  (frequently yelling) ? ?Handedness:Right ? ? ?Mood and Affect  ?Mood:Irritable; Labile; Angry ? ?Affect:Congruent; Labile ? ? ?Thought Process  ?Thought Processes:Coherent ? ?Descriptions of Associations:Intact ? ?Orientation:Full (Time, Place and Person) ? ?Thought Content:Logical ?Patient yelling about wanting to be discharged but able to clearly state no SI or HI.  When asked about AVH he reports no but will at times appear to respond to internal stimuli and other times not respond. ? ?Diagnosis of Schizophrenia or Schizoaffective disorder in past: Yes ? Duration of Psychotic Symptoms: Greater than six  months ? ? Hallucinations:Hallucinations: -- (reports no AVH, however, at times appears to be responding to internal stimuli but at other times not.) ? ?Ideas of Reference:None ? ?Suicidal Thoughts:Suicidal Thoughts: No ? ?Homicidal Thoughts:Homicidal Thoughts: No ? ? ?Sensorium  ?Memory:Immediate Fair; Recent Fair ? ?Judgment:Fair ? ?Insight:Shallow ? ? ?Executive Functions  ?Concentration:Fair ? ?Attention Span:Fair ? ?Recall:Fair ? ?Fund of Knowledge:Fair ? ?Language:Fair ? ? ?Psychomotor Activity  ?Psychomotor Activity:Psychomotor Activity: Normal ? ? ?Assets  ?Assets:Communication Skills; Resilience; Physical Health ? ? ?Sleep  ?Sleep:Sleep: Fair ? ? ? ?Physical Exam  ?Physical Exam ?Vitals and nursing note reviewed.  ?Constitutional:   ?   General: He is not in acute distress. ?   Appearance: Normal appearance. He is normal weight. He is not ill-appearing or toxic-appearing.  ?HENT:  ?   Head: Normocephalic and atraumatic.  ?Pulmonary:  ?   Effort: Pulmonary effort is normal.  ?Musculoskeletal:     ?   General: Normal range of motion.  ?Neurological:  ?   General: No focal deficit present.  ?   Mental Status: He is alert.  ? ?Review of Systems  ?Respiratory:  Negative for cough and shortness of breath.   ?Cardiovascular:  Negative for chest pain.  ?Gastrointestinal:  Negative for abdominal pain, constipation,  diarrhea, nausea and vomiting.  ?Neurological:  Negative for dizziness, weakness and headaches.  ?Psychiatric/Behavioral:  Negative for depression and suicidal ideas. The patient is not nervous/anxious

## 2021-09-01 NOTE — ED Triage Notes (Signed)
[employer] brings pt in voluntary for SI. Pt was [place] the gas station yelling at his own reflection "don't kill yourself". Pt states he has not been on his medication. ?

## 2021-09-01 NOTE — ED Notes (Signed)
Pt is currently sleeping, no distress noted, environmental check complete, will continue to monitor patient for safety. ? ?

## 2021-09-01 NOTE — Discharge Instructions (Signed)
?  Please come to [facility] (this facility) during walk in hours for appointment with psychiatrist/provider for further medication management and for therapists for therapy.  ? ? Walk-Ins for medication management  are available on Monday, Wednesday, Thursday and Friday from 8am-11am.  It is first come, first -serve; it is best to arrive by 7:00 AM.  ? ? Walk-Ins for therapy are  available on Monday and Wednesday?s  8am-11am.  It is first come, first -serve; it is best to arrive by 7:00 AM.  ? ? ?When you arrive please go upstairs for your appointment. If you are unsure of where to go, inform the front desk that you are here for a walk in appointment and they will assist you with directions upstairs. ? ?Address:  ?[address], in [place], [address] ?Ph: [phone]  ? ? ? ? ? ?

## 2021-09-01 NOTE — ED Provider Notes (Addendum)
?   [facility]Provider Note ? ? ?CSN: [id number] ?Arrival date & time: 09/01/21  1210 ? ?  ? ?History ? ?Chief Complaint  ?Patient presents with  ? Psychiatric Evaluation  ? ? ?Roberto Osborn is a 51 y.o. male. ? ?51 year old male presents with worsening chronic pain all over.  Has been seen at the [facility] as well as the ED for similar symptoms recently.  He is currently homeless.  Has multiple ED visits for same. ? ? ?  ? ?Home Medications ?Prior to Admission medications   ?Medication Sig Start Date End Date Taking? Authorizing Provider  ?citalopram (CELEXA) 10 MG tablet Take 1 tablet (10 mg total) by mouth daily. 08/28/21 09/27/21  Ernie Avena, MD  ?risperiDONE (RISPERDAL) 1 MG tablet Take 1 tablet (1 mg total) by mouth at bedtime. 08/28/21 09/27/21  Ernie Avena, MD  ?   ? ?Allergies    ?Patient has no known allergies.   ? ?Review of Systems   ?Review of Systems ? ?Physical Exam ?Updated Vital Signs ?BP (!) 152/85 (BP Location: Left Arm)   Pulse 95   Temp 98.5 ?F (36.9 ?C) (Oral)   Resp 16   SpO2 99%  ?Physical Exam ? ?ED Results / Procedures / Treatments   ?Labs ?(all labs ordered are listed, but only abnormal results are displayed) ?Labs Reviewed - No data to display ? ?EKG ?None ? ?Radiology ?No results found. ? ?Procedures ?Procedures  ? ? ?Medications Ordered in ED ?Medications - No data to display ? ?ED Course/ Medical Decision Making/ A&P ?  ?                        ?Medical Decision Making ? ?Patient does not appear to have any acute psychiatric emergency here.  Patient has no plan for suicide attempt at this time.  He is not homicidal.  His main concern is around his chronic pain which she has had for several years.  I do not feel that he needs labs at this time.  He has been belligerent.  He will be discharged from the facility ? ? ? ? ? ? ? ?Final Clinical Impression(s) / ED Diagnoses ?Final diagnoses:  ?None  ? ? ?Rx / DC Orders ?ED Discharge Orders   ? ? None  ? ?   ? ? ?  ?Lorre Nick, MD ?09/01/21 1401 ? ?  ?Lorre Nick, MD ?09/01/21 1402 ? ?

## 2021-09-15 ENCOUNTER — Telehealth (HOSPITAL_COMMUNITY): Payer: Self-pay

## 2021-09-15 NOTE — BH Assessment (Signed)
Care Management - [facility] Follow Up Discharges  ° °Writer attempted to make contact with patient today and was unsuccessful.  No phone number listed in epic. ° °Per chart review, patient was provided with outpatient resources. ° °

## 2021-12-21 ENCOUNTER — Ambulatory Visit (INDEPENDENT_AMBULATORY_CARE_PROVIDER_SITE_OTHER)
Admission: EM | Admit: 2021-12-21 | Discharge: 2021-12-21 | Disposition: A | Discharge status: Home / Self Care | Payer: Medicare PPO | Source: Home / Self Care

## 2021-12-21 ENCOUNTER — Other Ambulatory Visit: Payer: Self-pay

## 2021-12-21 ENCOUNTER — Emergency Department (HOSPITAL_COMMUNITY)
Admission: EM | Admit: 2021-12-21 | Discharge: 2021-12-22 | Disposition: A | Discharge status: Home / Self Care | Payer: Medicare PPO | Attending: Emergency Medicine | Admitting: Emergency Medicine

## 2021-12-21 ENCOUNTER — Encounter (HOSPITAL_COMMUNITY): Payer: Self-pay | Admitting: Emergency Medicine

## 2021-12-21 DIAGNOSIS — F901 Attention-deficit hyperactivity disorder, predominantly hyperactive type: Secondary | ICD-10-CM | POA: Diagnosis present

## 2021-12-21 DIAGNOSIS — Y900 Blood alcohol level of less than 20 mg/100 ml: Secondary | ICD-10-CM | POA: Insufficient documentation

## 2021-12-21 DIAGNOSIS — R45851 Suicidal ideations: Secondary | ICD-10-CM

## 2021-12-21 DIAGNOSIS — Z5900 Homelessness unspecified: Secondary | ICD-10-CM | POA: Insufficient documentation

## 2021-12-21 DIAGNOSIS — Z20822 Contact with and (suspected) exposure to covid-19: Secondary | ICD-10-CM | POA: Insufficient documentation

## 2021-12-21 DIAGNOSIS — Z765 Malingerer [conscious simulation]: Secondary | ICD-10-CM | POA: Insufficient documentation

## 2021-12-21 DIAGNOSIS — F17210 Nicotine dependence, cigarettes, uncomplicated: Secondary | ICD-10-CM | POA: Insufficient documentation

## 2021-12-21 DIAGNOSIS — Z59819 Housing instability, housed unspecified: Secondary | ICD-10-CM

## 2021-12-21 DIAGNOSIS — F69 Unspecified disorder of adult personality and behavior: Secondary | ICD-10-CM | POA: Diagnosis not present

## 2021-12-21 DIAGNOSIS — F430 Acute stress reaction: Secondary | ICD-10-CM

## 2021-12-21 LAB — HEMOGLOBIN A1C
Hgb A1c MFr Bld: 5 % (ref 4.8–5.6)
Mean Plasma Glucose: 96.8 mg/dL

## 2021-12-21 LAB — LIPID PANEL
Cholesterol: 146 mg/dL (ref 0–200)
HDL: 67 mg/dL (ref >40)
LDL Cholesterol: 60 mg/dL (ref 0–99)
Total CHOL/HDL Ratio: 2.2 RATIO
Triglycerides: 94 mg/dL (ref <150)
VLDL: 19 mg/dL (ref 0–40)

## 2021-12-21 LAB — POC SARS CORONAVIRUS 2 AG: SARSCOV2ONAVIRUS 2 AG: NEGATIVE

## 2021-12-21 LAB — COMPREHENSIVE METABOLIC PANEL
ALT: 16 U/L (ref 0–44)
AST: 19 U/L (ref 15–41)
Albumin: 3.8 g/dL (ref 3.5–5.0)
Alkaline Phosphatase: 57 U/L (ref 38–126)
Anion gap: 5 (ref 5–15)
BUN: 15 mg/dL (ref 6–20)
CO2: 26 mmol/L (ref 22–32)
Calcium: 9.2 mg/dL (ref 8.9–10.3)
Chloride: 107 mmol/L (ref 98–111)
Creatinine, Ser: 0.73 mg/dL (ref 0.61–1.24)
GFR, Estimated: >60 mL/min (ref >60)
Glucose, Bld: 119 mg/dL — ABNORMAL HIGH (ref 70–99)
Potassium: 4.5 mmol/L (ref 3.5–5.1)
Sodium: 138 mmol/L (ref 135–145)
Total Bilirubin: 0.3 mg/dL (ref 0.3–1.2)
Total Protein: 5.7 g/dL — ABNORMAL LOW (ref 6.5–8.1)

## 2021-12-21 LAB — POCT URINE DRUG SCREEN - MANUAL ENTRY (I-SCREEN)
POC Amphetamine UR: NOT DETECTED
POC Buprenorphine (BUP): NOT DETECTED
POC Cocaine UR: NOT DETECTED
POC Marijuana UR: NOT DETECTED
POC Methadone UR: NOT DETECTED
POC Methamphetamine UR: NOT DETECTED
POC Morphine: NOT DETECTED
POC Oxazepam (BZO): NOT DETECTED
POC Oxycodone UR: NOT DETECTED
POC Secobarbital (BAR): NOT DETECTED

## 2021-12-21 LAB — RAPID URINE DRUG SCREEN, HOSP PERFORMED
Amphetamines: NOT DETECTED
Barbiturates: NOT DETECTED
Benzodiazepines: NOT DETECTED
Cocaine: NOT DETECTED
Opiates: NOT DETECTED
Tetrahydrocannabinol: NOT DETECTED

## 2021-12-21 LAB — RESP PANEL BY RT-PCR (FLU A&B, COVID) ARPGX2
Influenza A by PCR: NEGATIVE
Influenza A by PCR: NEGATIVE
Influenza B by PCR: NEGATIVE
Influenza B by PCR: NEGATIVE
SARS Coronavirus 2 by RT PCR: NEGATIVE
SARS Coronavirus 2 by RT PCR: NEGATIVE

## 2021-12-21 LAB — CBC WITH DIFFERENTIAL/PLATELET
Abs Immature Granulocytes: 0.03 10*3/uL (ref 0.00–0.07)
Basophils Absolute: 0 10*3/uL (ref 0.0–0.1)
Basophils Relative: 0 %
Eosinophils Absolute: 0.2 10*3/uL (ref 0.0–0.5)
Eosinophils Relative: 2 %
HCT: 38 % — ABNORMAL LOW (ref 39.0–52.0)
Hemoglobin: 12.9 g/dL — ABNORMAL LOW (ref 13.0–17.0)
Immature Granulocytes: 0 %
Lymphocytes Relative: 18 %
Lymphs Abs: 1.5 10*3/uL (ref 0.7–4.0)
MCH: 30.3 pg (ref 26.0–34.0)
MCHC: 33.9 g/dL (ref 30.0–36.0)
MCV: 89.2 fL (ref 80.0–100.0)
Monocytes Absolute: 0.7 10*3/uL (ref 0.1–1.0)
Monocytes Relative: 9 %
Neutro Abs: 5.8 10*3/uL (ref 1.7–7.7)
Neutrophils Relative %: 71 %
Platelets: 216 10*3/uL (ref 150–400)
RBC: 4.26 MIL/uL (ref 4.22–5.81)
RDW: 13.8 % (ref 11.5–15.5)
WBC: 8.2 10*3/uL (ref 4.0–10.5)
nRBC: 0 % (ref 0.0–0.2)

## 2021-12-21 LAB — ETHANOL
Alcohol, Ethyl (B): <10 mg/dL (ref <10)
Alcohol, Ethyl (B): <10 mg/dL (ref <10)

## 2021-12-21 LAB — ACETAMINOPHEN LEVEL: Acetaminophen (Tylenol), Serum: <10 ug/mL — ABNORMAL LOW (ref 10–30)

## 2021-12-21 LAB — SALICYLATE LEVEL: Salicylate Lvl: <7 mg/dL — ABNORMAL LOW (ref 7.0–30.0)

## 2021-12-21 LAB — TSH: TSH: 3.113 u[IU]/mL (ref 0.350–4.500)

## 2021-12-21 MED ORDER — CITALOPRAM HYDROBROMIDE 10 MG PO TABS
10.0000 mg | ORAL_TABLET | Freq: Every day | ORAL | Status: DC
  Administered 2021-12-21: 10 mg via ORAL
  Filled 2021-12-21: qty 1

## 2021-12-21 MED ORDER — ACETAMINOPHEN 325 MG PO TABS: 650.0000 mg | ORAL_TABLET | Freq: Four times a day (QID) | ORAL | Status: DC | PRN

## 2021-12-21 MED ORDER — MAGNESIUM HYDROXIDE 400 MG/5ML PO SUSP: 30.0000 mL | Freq: Every day | ORAL | Status: DC | PRN

## 2021-12-21 MED ORDER — LORAZEPAM 1 MG PO TABS
2.0000 mg | ORAL_TABLET | Freq: Once | ORAL | Status: AC
  Administered 2021-12-21: 2 mg via ORAL
  Filled 2021-12-21: qty 2

## 2021-12-21 MED ORDER — RISPERIDONE 1 MG PO TABS
1.0000 mg | ORAL_TABLET | Freq: Every day | ORAL | Status: DC
  Administered 2021-12-21: 1 mg via ORAL
  Filled 2021-12-21: qty 1

## 2021-12-21 MED ORDER — ALUM & MAG HYDROXIDE-SIMETH 200-200-20 MG/5ML PO SUSP: 30.0000 mL | ORAL | Status: DC | PRN

## 2021-12-21 NOTE — BH Assessment (Signed)
LCSW Progress Note   Per Kelle Darting, NP, this pt does not require psychiatric hospitalization at this time.  Pt is psychiatrically cleared.  Discharge instructions include several resources for outpatient medication management and therapy along with shelter resources and the mobile crisis number.  EDP Kelle Darting, NP, has been notified.  Hansel Starling, MSW, LCSW [facility] [phone] or [phone]

## 2021-12-21 NOTE — BH Assessment (Addendum)
Comprehensive Clinical Assessment (CCA) Screening, Triage and Referral Note  12/21/2021 [facility] [id number] Disposition: Pt has been seen by NP Sindy Guadeloupe.  Roberto Osborn completed the MSE.  Patient had been accepted to [facility] for continuous assessment.  During this clinician's efforts to complete the assessment, patient said he wanted to leave.  He said "I just want to get a drink of water and leave."  Clinician informed Roberto Osborn and he said patient could leave AMA.  Patient paces around during assessment.  He asked why the assessment needed to be completed because he initially thought he was going to be discharged.  Pt has good eye contact.  He does not appear to be responding to internal stimuli.  Patient does not evidence any severe delusional thought patterns although he talks about people being out to kill him.  Pt has normal appetite but poor sleep.  Pt has no outpatient provider.     Chief Complaint:  Chief Complaint  Patient presents with   suicidal ideation   Hallucinations   Visit Diagnosis: MDD recurrent, moderate  Patient Reported Information How did you hear about [place]? Self  What Is the Reason for Your Visit/Call Today? Pt presents to [facility] voluntarily, accompanied by [employer] at this time. [place] asked to be taken to a shelter or to jail. Pt has hx of malingering as well as behavior disturbances. Pt reports having suicidal ideations, but with no plan. Pt also endorses auditory and visual hallucinations. Pt reports hearing "kill yourself, die". Pt reports seeing faces and shadows at times. Pt denies HI and alcohol/substance use.  Patient says that he was put in jail at age 8 and that people have been trying to kill him since.  He talks about his deceased brother.  He mentioned a girlfriend picking him up.  Patient tells this clinician that he wants to get a drink of water and leave.  How Long Has This Been Causing You Problems? > than 6  months  What Do You Feel Would Help You the Most Today? -- (Pt states he "just wants housing")   Have You Recently Had Any Thoughts About Hurting Yourself? Yes  Are You Planning to Commit Suicide/Harm Yourself At This time? No   Have you Recently Had Thoughts About Hurting Someone Roberto Osborn? No  Are You Planning to Harm Someone at This Time? No  Explanation: No data recorded  Have You Used Any Alcohol or Drugs in the Past 24 Hours? No  How Long Ago Did You Use Drugs or Alcohol? No data recorded What Did You Use and How Much? No data recorded  Do You Currently Have a Therapist/Psychiatrist? No  Name of Therapist/Psychiatrist: No data recorded  Have You Been Recently Discharged From Any Office Practice or Programs? No  Explanation of Discharge From Practice/Program: Discharged from [facility] earlier today.    CCA Screening Triage Referral Assessment Type of Contact: Face-to-Face  Telemedicine Service Delivery:   Is this Initial or Reassessment? Initial Assessment  Date Telepsych consult ordered in [facility]:  02/24/21  Time Telepsych consult ordered in [facility]:  2112  Location of Assessment: [name]  Provider Location: [facility] Assessment Services   Collateral Involvement: [employer] who is present   Does Patient Have a Automotive engineer Guardian? No data recorded Name and Contact of Legal Guardian: No data recorded If Minor and Not Living with Parent(s), Who has Custody? NA  Is CPS involved or ever been involved?  Never  Is APS involved or ever been involved? Never   Patient Determined To Be At Risk for Harm To Self or Others Based on Review of Patient Reported Information or Presenting Complaint? Yes, for Self-Harm  Method: No data recorded Availability of Means: No data recorded Intent: No data recorded Notification Required: No data recorded Additional Information for Danger to Others Potential: No data recorded Additional Comments for Danger to Others  Potential: No data recorded Are There Guns or Other Weapons in Your Home? No data recorded Types of Guns/Weapons: No data recorded Are These Weapons Safely Secured?                            No data recorded Who Could Verify You Are Able To Have These Secured: No data recorded Do You Have any Outstanding Charges, Pending Court Dates, Parole/Probation? No data recorded Contacted To Inform of Risk of Harm To Self or Others: Other: Comment (NA)   Does Patient Present under Involuntary Commitment? No  IVC Papers Initial File Date: No data recorded  [place] of Residence: [place] (Homeless in [place])   Patient Currently Receiving the Following Services: Not Receiving Services   Determination of Need: Urgent (48 hours)   Options For Referral: Other: Comment; Outpatient Therapy; Medication Management; [facility] (Pt left [facility] AMA)   Discharge Disposition:     [place], LCAS

## 2021-12-21 NOTE — ED Notes (Signed)
Breakfast given.  

## 2021-12-21 NOTE — ED Provider Notes (Signed)
FBC/OBS ASAP Discharge Summary  Date and Time: 12/21/2021 12:35 PM  Name: Roberto Osborn  MRN:  [id number]   Discharge Diagnoses:  Final diagnoses:  Suicidal ideation  Malingering  Behavior concern in adult   Subjective:   Pt reports current euthymic mood. He states he was living at [facility] [employer] and left yesterday because he "couldn't relax" as they were making him work. He states he asked police to take him to bring him to a shelter or jail. Police told him that he was not eligible to go to jail as he did not do anything wrong. He states he then broke a window so he could go to jail but police brought him to this facility instead. He verbalizes readiness to discharge. He is forward thinking. He states "I know a girl up the street". He states he plans on asking her for a place to stay for a night. He denies SI/VI/HI, AVH, paranoia. He is able to verbally contract to safety for himself or others.   Stay Summary:  Pt presents to [facility] on [id number] w/ police escort. On reassessment, he denies SI/VI/HI, AVH, paranoia. Pt is discharged.  Total Time spent with patient: 20 minutes  Past Psychiatric History: Per chart review, hx of adjustment disorder, homelessness, major depressive disorder, psychotic disorder Past Medical History:  Past Medical History:  Diagnosis Date   Homeless    No past surgical history on file. Family History: No family history on file. Family Psychiatric History: Pt denies knowledge of family psychiatric hx Social History:  Social History   Substance and Sexual Activity  Alcohol Use None     Social History   Substance and Sexual Activity  Drug Use Not on file    Social History   Socioeconomic History   Marital status: Single    Spouse name: Not on file   Number of children: Not on file   Years of education: Not on file   Highest education level: Not on file  Occupational History   Not on file  Tobacco Use   Smoking status: Every Day     Types: Cigarettes   Smokeless tobacco: Not on file  Substance and Sexual Activity   Alcohol use: Not on file   Drug use: Not on file   Sexual activity: Not on file  Other Topics Concern   Not on file  Social History Narrative   Not on file   Social Determinants of Health   Financial Resource Strain: Not on file  Food Insecurity: Not on file  Transportation Needs: Not on file  Physical Activity: Not on file  Stress: Not on file  Social Connections: Not on file   SDOH:  SDOH Screenings   Alcohol Screen: Not on file  Depression (PHQ2-9): Low Risk  (08/31/2021)   Depression (PHQ2-9)    PHQ-2 Score: 1  Financial Resource Strain: Not on file  Food Insecurity: Not on file  Housing: Not on file  Physical Activity: Not on file  Social Connections: Not on file  Stress: Not on file  Tobacco Use: High Risk (09/01/2021)   Patient History    Smoking Tobacco Use: Every Day    Smokeless Tobacco Use: Unknown    Passive Exposure: Not on file  Transportation Needs: Not on file    Tobacco Cessation:    Current Medications:  Current Facility-Administered Medications  Medication Dose Route Frequency Provider Last Rate Last Admin   acetaminophen (TYLENOL) tablet 650 mg  650 mg Oral Q6H PRN  Sindy Guadeloupe, NP       alum & mag hydroxide-simeth (MAALOX/MYLANTA) 200-200-20 MG/5ML suspension 30 mL  30 mL Oral Q4H PRN Sindy Guadeloupe, NP       citalopram (CELEXA) tablet 10 mg  10 mg Oral Daily Sindy Guadeloupe, NP   10 mg at 12/21/21 2229   magnesium hydroxide (MILK OF MAGNESIA) suspension 30 mL  30 mL Oral Daily PRN Sindy Guadeloupe, NP       risperiDONE (RISPERDAL) tablet 1 mg  1 mg Oral QHS Sindy Guadeloupe, NP   1 mg at 12/21/21 0159   Current Outpatient Medications  Medication Sig Dispense Refill   citalopram (CELEXA) 10 MG tablet Take 1 tablet (10 mg total) by mouth daily. 30 tablet 0   risperiDONE (RISPERDAL) 1 MG tablet Take 1 tablet (1 mg total) by mouth at bedtime. 30 tablet 0    PTA  Medications: (Not in a hospital admission)      08/31/2021    7:09 PM  Depression screen PHQ 2/9  Decreased Interest 0  Down, Depressed, Hopeless 1  PHQ - 2 Score 1    Flowsheet Row ED from 09/01/2021 in [facility] from 08/31/2021 in [facility] ED from 08/29/2021 in [facility] EMERGENCY DEPARTMENT  C-SSRS RISK CATEGORY Error: Q3, 4, or 5 should not be populated when Q2 is No No Risk Error: Q3, 4, or 5 should not be populated when Q2 is No       Musculoskeletal  Strength & Muscle Tone: within normal limits Gait & Station: normal Patient leans: N/A  Psychiatric Specialty Exam  Presentation  General Appearance: Disheveled  Eye Contact:Fair  Speech:Clear and Coherent  Speech Volume:Normal  Handedness:  Mood and Affect  Mood:Euthymic  Affect:Blunt  Thought Process  Thought Processes:Coherent  Descriptions of Associations:Intact  Orientation:Full (Time, Place and Person)  Thought Content:Logical  Diagnosis of Schizophrenia or Schizoaffective disorder in past: Yes  Duration of Psychotic Symptoms: Greater than six months   Hallucinations:Hallucinations: None  Ideas of Reference:None  Suicidal Thoughts:Suicidal Thoughts: No  Homicidal Thoughts:Homicidal Thoughts: No  Sensorium  Memory:Immediate Fair  Judgment:Fair  Insight:Fair  Executive Functions  Concentration:Fair  Attention Span:Fair  Recall:Fair  Fund of Knowledge:Fair  Language:Fair  Psychomotor Activity  Psychomotor Activity:Psychomotor Activity: Normal  Assets  Assets:Desire for Improvement; Communication Skills  Sleep  Sleep:Sleep: Fair  Nutritional Assessment (For OBS and FBC admissions only) Has the patient had a weight loss or gain of 10 pounds or more in the last 3 months?: No Has the patient had a decrease in food intake/or appetite?: No Does the patient have dental problems?: No Does the  patient have eating habits or behaviors that may be indicators of an eating disorder including binging or inducing vomiting?: No Has the patient recently lost weight without trying?: 0 Has the patient been eating poorly because of a decreased appetite?: 0 Malnutrition Screening Tool Score: 0   Physical Exam  Physical Exam Cardiovascular:     Rate and Rhythm: Normal rate.  Pulmonary:     Effort: Pulmonary effort is normal.  Neurological:     Mental Status: He is alert and oriented to person, place, and time.  Psychiatric:        Attention and Perception: Attention and perception normal.        Mood and Affect: Mood normal. Affect is blunt.        Speech: Speech normal.        Behavior:  Behavior normal. Behavior is cooperative.        Thought Content: Thought content normal.    Review of Systems  Constitutional:  Negative for chills and fever.  Respiratory:  Negative for shortness of breath.   Cardiovascular:  Negative for chest pain and palpitations.  Gastrointestinal:  Negative for abdominal pain.  Neurological:  Negative for dizziness and headaches.  Psychiatric/Behavioral: Negative.     Blood pressure 125/84, pulse 96, temperature 98.7 F (37.1 C), temperature source Oral, resp. rate 16, SpO2 99 %. There is no height or weight on file to calculate BMI.  Demographic Factors:  Male, Caucasian, Low socioeconomic status, and Unemployed  Loss Factors: NA  Historical Factors: Prior suicide attempts  Risk Reduction Factors:   NA  Continued Clinical Symptoms:  Previous Psychiatric Diagnoses and Treatments  Cognitive Features That Contribute To Risk:  None    Suicide Risk:  Mild:  Suicidal ideation of limited frequency, intensity, duration, and specificity.  There are no identifiable plans, no associated intent, mild dysphoria and related symptoms, good self-control (both objective and subjective assessment), few other risk factors, and identifiable protective factors,  including available and accessible social support.  Plan Of Care/Follow-up recommendations:  Follow up with outpatient resources  Disposition:  Discharge  Lauree Chandler, NP 12/21/2021, 12:35 PM

## 2021-12-21 NOTE — ED Triage Notes (Signed)
Pt presents to [facility] voluntarily, accompanied by [employer] at this time. [place] to be taken to a shelter or to jail. Pt has hx of malingering as well as behavior disturbances. Pt reports having suicidal ideations, but with no plan. Pt also endorses auditory and visual hallucinations. Pt reports hearing "kill yourself, die". Pt reports seeing faces and shadows at times. Pt denies HI and alcohol/substance use.

## 2021-12-21 NOTE — ED Provider Notes (Signed)
[facility] EMERGENCY DEPARTMENT Provider Note   CSN: [id number] Arrival date & time: 12/21/21  1635     History  Chief Complaint  Patient presents with   Psychiatric Evaluation    Roberto Osborn is a 51 y.o. male.  Patient is a 51 year old male with past medical history of homelessness presenting to being sent from behavioral health unit.  Patient was previously at [facility] [employer] where he stated they were making him doing work and he can no longer live there.  Patient then was reported to have called the cops and requested that they take him to jail.  They brought him here.  Currently patient states " I need to be admitted to the hospital.  I cannot go out there again.  If you discharge me I am going to crack my head open on the cement.  I am going to kill myself."  When patient is asked about other questions he continuously reports " I am going to kill myself.  I need to be admitted."  Review demonstrates patient has a history of malingering, homelessness, and suicidal ideation.  The history is provided by the patient. No language interpreter was used.       Home Medications Prior to Admission medications   Medication Sig Start Date End Date Taking? Authorizing Provider  citalopram (CELEXA) 10 MG tablet Take 1 tablet (10 mg total) by mouth daily. 08/28/21 09/27/21  Ernie Avena, MD  risperiDONE (RISPERDAL) 1 MG tablet Take 1 tablet (1 mg total) by mouth at bedtime. 08/28/21 09/27/21  Ernie Avena, MD      Allergies    Patient has no known allergies.    Review of Systems   Review of Systems  Constitutional:  Negative for chills and fever.  HENT:  Negative for ear pain and sore throat.   Eyes:  Negative for pain and visual disturbance.  Respiratory:  Negative for cough and shortness of breath.   Cardiovascular:  Negative for chest pain and palpitations.  Gastrointestinal:  Negative for abdominal pain and vomiting.  Genitourinary:  Negative for  dysuria and hematuria.  Musculoskeletal:  Negative for arthralgias and back pain.  Skin:  Negative for color change and rash.  Neurological:  Negative for seizures and syncope.  Psychiatric/Behavioral:  Positive for suicidal ideas.   All other systems reviewed and are negative.   Physical Exam Updated Vital Signs BP (!) 160/93 (BP Location: Right Arm)   Pulse 100   Temp 98.3 F (36.8 C) (Oral)   Resp 18   SpO2 98%  Physical Exam Vitals and nursing note reviewed.  Constitutional:      General: He is not in acute distress.    Appearance: He is well-developed.  HENT:     Head: Normocephalic and atraumatic.  Eyes:     Conjunctiva/sclera: Conjunctivae normal.  Cardiovascular:     Rate and Rhythm: Normal rate and regular rhythm.     Heart sounds: No murmur heard. Pulmonary:     Effort: Pulmonary effort is normal. No respiratory distress.     Breath sounds: Normal breath sounds.  Abdominal:     Palpations: Abdomen is soft.     Tenderness: There is no abdominal tenderness.  Musculoskeletal:        General: No swelling.     Cervical back: Neck supple.  Skin:    General: Skin is warm and dry.     Capillary Refill: Capillary refill takes less than 2 seconds.  Neurological:  Mental Status: He is alert.  Psychiatric:        Attention and Perception: He does not perceive auditory or visual hallucinations.        Mood and Affect: Mood normal.        Behavior: Behavior is hyperactive.        Thought Content: Thought content includes suicidal ideation. Thought content includes suicidal plan.     ED Results / Procedures / Treatments   Labs (all labs ordered are listed, but only abnormal results are displayed) Labs Reviewed  ACETAMINOPHEN LEVEL - Abnormal; Notable for the following components:      Result Value   Acetaminophen (Tylenol), Serum <10 (*)    All other components within normal limits  SALICYLATE LEVEL - Abnormal; Notable for the following components:    Salicylate Lvl <7.0 (*)    All other components within normal limits  RESP PANEL BY RT-PCR (FLU A&B, COVID) ARPGX2  ETHANOL  RAPID URINE DRUG SCREEN, HOSP PERFORMED    EKG None  Radiology No results found.  Procedures Procedures    Medications Ordered in ED Medications - No data to display  ED Course/ Medical Decision Making/ A&P                           Medical Decision Making  44:65 PM 51 year old male with past medical history of homelessness presenting to being sent from behavioral health unit.  Patient is alert and oriented x3, no acute distress, afebrile, stable vital signs.  Physical exam demonstrates repetitive speech, hyperactive behavior, and suicidal ideation with plan.  Chart review demonstrates history of malingering.  Medically cleared at this time and safe to be evaluated by B[facility] physician.  Ethanol, salicylate, and Tylenol within normal limits.        Final Clinical Impression(s) / ED Diagnoses Final diagnoses:  Suicidal ideation    Rx / DC Orders ED Discharge Orders     None         Franne Forts, DO 12/21/21 1826

## 2021-12-21 NOTE — ED Notes (Signed)
Pt is in the bed sleeping. Respirations are even and unlabored. No acute distress noted. Will continue to monitor for safety. 

## 2021-12-21 NOTE — ED Notes (Signed)
TTS machine placed in pts room for assessment

## 2021-12-21 NOTE — Discharge Instructions (Addendum)
Base on the information you have provided and the presenting issue, outpatient services with therapy and psychiatry have been recommended.  It is imperative that you follow through with treatment recommendations within 5-7 days from the of discharge to mitigate further risk to your safety and mental well-being. A list of referrals has been provided below to get you started.  You are not limited to the list provided.  In case of an urgent crisis, you may contact the Mobile Crisis Unit with [facility] at [phone].  Outpatient Therapy and Psychiatry for Medicare Recipients  [facility] [address] [place], [place], [address] [phone] phone  [facility] [address], [address] [place], [place], [address] [phone] phone  [facility] [address] [place], [place], [address] [phone] phone  [facility] [address] [place], [place], [address] [phone] phone [phone] fax  [facility] [address], [place], [address] [phone] phone  [facility] [address] [place], [place], [address] [phone] phone [phone] fax  [facility] [address] [place], [place], [address] [phone] phone  Jov[facility] Kussmaul [address] [place], [place], [address] [phone] phone  [facility] [address] [place], [place], [address] [phone] phone [phone] fax   [facility] [address], [place], [place] [address] [phone] Population served: Adult men & women (20 years old and older, able to perform activities for daily living) Documents required: Valid ID & Social Security Card  [facility] [address] [place], [place] [address] [phone] Population served: Males 18+ Documents  required: Valid ID & Social Security [employer] [address], [place], [place] [address] [phone] Population Served: Families with children, adult women, and adult men.  [facility] [address] [place], [place] [address] [phone] Population served: Men 18+, preference for disabled and/or veterans Eligibility: By referral only     Medical laboratory scientific officer at [facility]  [address], [address]  [place], [place] [address] [phone] phone  Go online to [email or web address] - Click on Our Services - Long-Term Supports and Services - Residential Options - Look to the right side of the screen and click on "Information Request Form." - Complete the information request form and be sure to have a solid contact number for them to reach you at.  You can also go to the physical office and inquire about availability in [employer] [address] and [employer] for adults with mental illness on disability.  It would also behoove you to establish medication management, therapy, or see if you can have an Doctor, hospital (ACTT) assigned to you that can give you all of that in one team.  With ACTT, they will meet you in the community and assist with wrap-around services to make sure you maintain your goals with mental health and independent living.   You have the option to apply at HUD for Section 8 housing even if there is a waiting list.  HUD will usually have a list of subsidized housing available in the area that you can go to on your own and apply for a lease.  [facility] [address] [place], [place] [address] [phone] phone  [place] - [phone] [address] [place],  [place], [address] Some 1-bedrooms open  You may have to leave a voice message as the individual you need to speak with is working in a different area today.

## 2021-12-21 NOTE — ED Provider Triage Note (Signed)
Emergency Medicine Provider Triage Evaluation Note  Roberto Osborn , a 51 y.o. male  was evaluated in triage.  Pt complains of "I am dying".  Patient endorses pain all over.  He admits to [facility] and has plans to bust his head on the concrete if he is discharged from the hospital.  Denies HI.  He admits to auditory and visual hallucinations.  Denies drug and alcohol use.  Patient was evaluated at behavioral urgent care prior to arrival and discharge.   Review of Systems  Positive: SI, hallucinations Negative: HI  Physical Exam  BP (!) 160/93 (BP Location: Right Arm)   Pulse 100   Temp 98.3 F (36.8 C) (Oral)   Resp 18   SpO2 98%  Gen:   Awake, no distress   Resp:  Normal effort  MSK:   Moves extremities without difficulty  Other:    Medical Decision Making  Medically screening exam initiated at 4:46 PM.  Appropriate orders placed.  Roberto Osborn was informed that the remainder of the evaluation will be completed by another provider, this initial triage assessment does not replace that evaluation, and the importance of remaining in the ED until their evaluation is complete.  Reviewed labs from [facility]. Will repeat acetaminophen, salicylate, UDS, and ethanol levels.   Roberto Osborn, [place] 12/21/21 1654

## 2021-12-21 NOTE — ED Notes (Signed)
Pt sitting up in bed yelling "I can't do it anymore I'm dying" continuously. Attempted to assist pt, pt only states "I'm dying I need something to relax". MD Wallace Cullens made aware.

## 2021-12-21 NOTE — ED Notes (Signed)
Patient admitted to Roberto Osborn endorsing SI. Denies HI and AVH. Patient was cooperative during the admission assessment. Patient unable provide urine sample as he had just voided. Skin assessment complete. Belongings inventoried. Patient oriented to unit and unit rules. Meal and drinks offered to patient. Patient verbalized agreement to treatment plans however wonders why he was told he will only stay one day. Patient verbally contracts for safety during hospitalization. Will continue to monitor for safety.

## 2021-12-21 NOTE — ED Notes (Signed)
Patient A&O x 4, ambulatory. Patient discharged in no acute distress. Patient denied SI/HI, A/VH upon discharge. Patient verbalized understanding of all discharge instructions explained by staff, to include follow up appointments, and safety plan. Patient reported mood 10/10.  Pt belongings returned to patient intact. Patient escorted to lobby via staff for transport to destination. Safety maintained.   

## 2021-12-21 NOTE — ED Provider Notes (Signed)
[facility] Continuous Assessment Admission H&P  Date: 12/21/21 Patient Name: Roberto Osborn MRN: [id number] Chief Complaint:  Chief Complaint  Patient presents with   suicidal ideation   Hallucinations      Diagnoses:  Final diagnoses:  Suicidal ideation  Malingering  Behavior concern in adult    HPI: Delray Altndrew Chohan,  51 y.o male,  with a history of homelessness, hallucination behavior disturbance malingering behaviors and suicidal ideation.  [facility].  Per the patient he asked the police to bring him to a shelter or jail.  Per the patient I am homeless walking the streets and I told the police to take me to jail, the cop told me they cannot take me to jail because I did not break the law so I break the law.  Per the patient he broke into someone's car so he could get arrested.  Patient also stated he is suicidal because of banging his head on something.  Patient has a extensive history of ER visit.   Face-to-face observation of patient, patient is alert and oriented, speech is clear, maintaining eye contact.  Mood is anxious affect congruent with mood.  Patient reports he is suicidal with plans to bang his head on something.  Patient denies HI, AH VH, or paranoia.  Per the patient he is homeless and we need to find him somewhere to live.  According to the patient he has no family in the area, according to patient he is taking medicine but could not tell me what the medicines were.  [facility] patient [facility]  Patient reports he smokes cigarettes, patient stated he drinks alcohol but not regular stating he last drunk a couple months ago.  Recommend inpatient observation  PHQ 2-9:   Flowsheet Row ED [facility]  C-SSRS RISK CATEGORY Error: Q3, 4, or 5 should not be populated when Q2 is No No Risk Error: Q3, 4, or 5 should not be populated when Q2 is No        Total Time spent with patient: 20 minutes  Musculoskeletal  Strength & Muscle Tone: within normal limits Gait & Station: normal Patient leans: N/A  Psychiatric Specialty Exam  Presentation General Appearance: Bizarre  Eye Contact:Fair  Speech:Clear and Coherent  Speech Volume:Normal  Handedness:Ambidextrous   Mood and Affect  Mood:Irritable  Affect:Congruent   Thought Process  Thought Processes:Coherent  Descriptions of Associations:Intact  Orientation:Full (Time, Place and Person)  Thought Content:WDL  Diagnosis of Schizophrenia or Schizoaffective disorder in past: Yes  Duration of Psychotic Symptoms: Greater than six months  Hallucinations:Hallucinations: None  Ideas of Reference:None  Suicidal Thoughts:Suicidal Thoughts: Yes, Passive SI Passive Intent and/or Plan: Without Plan  Homicidal Thoughts:Homicidal Thoughts: No   Sensorium  Memory:Immediate Fair  Judgment:Fair  Insight:Fair   Executive Functions  Concentration:Fair  Attention Span:Fair  Recall:Fair  Fund of Knowledge:Fair  Language:Fair   Psychomotor Activity  Psychomotor Activity:Psychomotor Activity: Normal   Assets  Assets:Desire for Improvement; Housing   Sleep  Sleep:Sleep: Fair   Nutritional Assessment (For OBS and FBC admissions only) Has the patient had a weight loss or gain of 10 pounds or more in the last 3 months?: No Has the patient had a decrease in food intake/or appetite?: No Does the patient have dental  problems?: No Does the patient have eating habits or behaviors that may be indicators of an eating disorder including binging or inducing vomiting?: No Has the patient recently lost weight without trying?: 0 Has the patient been eating poorly because of a decreased appetite?: 0 Malnutrition  Screening Tool Score: 0    Physical Exam HENT:     Head: Normocephalic.     Nose: Nose normal.  Cardiovascular:     Rate and Rhythm: Normal rate.  Musculoskeletal:        General: Normal range of motion.     Cervical back: Normal range of motion.  Skin:    General: Skin is warm.  Neurological:     General: No focal deficit present.     Mental Status: He is alert.  Psychiatric:        Mood and Affect: Mood normal.        Behavior: Behavior normal.        Thought Content: Thought content normal.        Judgment: Judgment normal.    Review of Systems  Constitutional: Negative.   HENT: Negative.    Eyes: Negative.   Respiratory: Negative.    Cardiovascular: Negative.   Gastrointestinal: Negative.   Genitourinary: Negative.   Musculoskeletal: Negative.   Skin: Negative.   Neurological: Negative.   Endo/Heme/Allergies: Negative.   Psychiatric/Behavioral:  Positive for suicidal ideas. The patient is nervous/anxious.     Blood pressure (!) 117/90, pulse (!) 130, temperature 97.9 F (36.6 C), temperature source Oral, resp. rate 18, SpO2 98 %. There is no height or weight on file to calculate BMI.  Past Psychiatric History: Suicidal ideation, hallucination, homelessness,  Is the patient at risk to self? Yes  Has the patient been a risk to self in the past 6 months? Yes .    Has the patient been a risk to self within the distant past? Yes   Is the patient a risk to others? No   Has the patient been a risk to others in the past 6 months? No   Has the patient been a risk to others within the distant past? No   Past Medical History:  Past Medical History:  Diagnosis Date   Homeless    No past surgical history on file.  Family History: No family history on file.  Social History:  Social History   Socioeconomic History   Marital status: Single    Spouse name: Not on file   Number of children: Not on file   Years of education: Not on file   Highest education level:  Not on file  Occupational History   Not on file  Tobacco Use   Smoking status: Every Day    Types: Cigarettes   Smokeless tobacco: Not on file  Substance and Sexual Activity   Alcohol use: Not on file   Drug use: Not on file   Sexual activity: Not on file  Other Topics Concern   Not on file  Social History Narrative   Not on file   Social Determinants of Health   Financial Resource Strain: Not on file  Food Insecurity: Not on file  Transportation Needs: Not on file  Physical Activity: Not on file  Stress: Not on file  Social Connections: Not on file  Intimate Partner Violence: Not on file    SDOH:  SDOH Screenings   Alcohol Screen: Not on file  Depression (PHQ2-9): Low Risk  (08/31/2021)   Depression (PHQ2-9)  PHQ-2 Score: 1  Financial Resource Strain: Not on file  Food Insecurity: Not on file  Housing: Not on file  Physical Activity: Not on file  Social Connections: Not on file  Stress: Not on file  Tobacco Use: High Risk (09/01/2021)   Patient History    Smoking Tobacco Use: Every Day    Smokeless Tobacco Use: Unknown    Passive Exposure: Not on file  Transportation Needs: Not on file    Last Labs:  Admission on 12/21/2021  Component Date Value Ref Range Status   SARS Coronavirus 2 by RT PCR 12/21/2021 NEGATIVE  NEGATIVE Final   Comment: (NOTE) SARS-CoV-2 target nucleic acids are NOT DETECTED.  The SARS-CoV-2 RNA is generally detectable in upper respiratory specimens during the acute phase of infection. The lowest concentration of SARS-CoV-2 viral copies this assay can detect is 138 copies/mL. A negative result does not preclude SARS-Cov-2 infection and should not be used as the sole basis for treatment or other patient management decisions. A negative result may occur with  improper specimen collection/handling, submission of specimen other than nasopharyngeal swab, presence of viral mutation(s) within the areas targeted by this assay, and inadequate  number of viral copies(<138 copies/mL). A negative result must be combined with clinical observations, patient history, and epidemiological information. The expected result is Negative.  Fact Sheet for Patients:  BloggerCourse.com  Fact Sheet for Healthcare Providers:  SeriousBroker.it  This test is no                          t yet approved or cleared by the [place] FDA and  has been authorized for detection and/or diagnosis of SARS-CoV-2 by FDA under an Emergency Use Authorization (EUA). This EUA will remain  in effect (meaning this test can be used) for the duration of the COVID-19 declaration under Section 564(b)(1) of the Act, 21 U.S.C.section 360bbb-3(b)(1), unless the authorization is terminated  or revoked sooner.       Influenza A by PCR 12/21/2021 NEGATIVE  NEGATIVE Final   Influenza B by PCR 12/21/2021 NEGATIVE  NEGATIVE Final   Comment: (NOTE) The Xpert Xpress SARS-CoV-2/FLU/RSV plus assay is intended as an aid in the diagnosis of influenza from Nasopharyngeal swab specimens and should not be used as a sole basis for treatment. Nasal washings and aspirates are unacceptable for Xpert Xpress SARS-CoV-2/FLU/RSV testing.  Fact Sheet for Patients: BloggerCourse.com  Fact Sheet for Healthcare Providers: SeriousBroker.it  This test is not yet approved or cleared by the [place] FDA and has been authorized for detection and/or diagnosis of SARS-CoV-2 by FDA under an Emergency Use Authorization (EUA). This EUA will remain in effect (meaning this test can be used) for the duration of the COVID-19 declaration under Section 564(b)(1) of the Act, 21 U.S.C. section 360bbb-3(b)(1), unless the authorization is terminated or revoked.  Performed at [facility], [address], [place], [place] [address]    WBC 12/21/2021 8.2  4.0 - 10.5 K/uL Final   RBC  12/21/2021 4.26  4.22 - 5.81 MIL/uL Final   Hemoglobin 12/21/2021 12.9 (L)  13.0 - 17.0 g/dL Final   HCT 60/45/4098 38.0 (L)  39.0 - 52.0 % Final   MCV 12/21/2021 89.2  80.0 - 100.0 fL Final   MCH 12/21/2021 30.3  26.0 - 34.0 pg Final   MCHC 12/21/2021 33.9  30.0 - 36.0 g/dL Final   RDW 11/91/4782 13.8  11.5 - 15.5 % Final  Platelets 12/21/2021 216  150 - 400 K/uL Final   nRBC 12/21/2021 0.0  0.0 - 0.2 % Final   Neutrophils Relative % 12/21/2021 71  % Final   Neutro Abs 12/21/2021 5.8  1.7 - 7.7 K/uL Final   Lymphocytes Relative 12/21/2021 18  % Final   Lymphs Abs 12/21/2021 1.5  0.7 - 4.0 K/uL Final   Monocytes Relative 12/21/2021 9  % Final   Monocytes Absolute 12/21/2021 0.7  0.1 - 1.0 K/uL Final   Eosinophils Relative 12/21/2021 2  % Final   Eosinophils Absolute 12/21/2021 0.2  0.0 - 0.5 K/uL Final   Basophils Relative 12/21/2021 0  % Final   Basophils Absolute 12/21/2021 0.0  0.0 - 0.1 K/uL Final   Immature Granulocytes 12/21/2021 0  % Final   Abs Immature Granulocytes 12/21/2021 0.03  0.00 - 0.07 K/uL Final   Performed at [facility], [address], [place], [place] [address]   Sodium 12/21/2021 138  135 - 145 mmol/L Final   Potassium 12/21/2021 4.5  3.5 - 5.1 mmol/L Final   Chloride 12/21/2021 107  98 - 111 mmol/L Final   CO2 12/21/2021 26  22 - 32 mmol/L Final   Glucose, Bld 12/21/2021 119 (H)  70 - 99 mg/dL Final   Glucose reference range applies only to samples taken after fasting for at least 8 hours.   BUN 12/21/2021 15  6 - 20 mg/dL Final   Creatinine, Ser 12/21/2021 0.73  0.61 - 1.24 mg/dL Final   Calcium 62/13/0865 9.2  8.9 - 10.3 mg/dL Final   Total Protein 78/46/9629 5.7 (L)  6.5 - 8.1 g/dL Final   Albumin 52/84/1324 3.8  3.5 - 5.0 g/dL Final   AST 40/01/2724 19  15 - 41 U/L Final   ALT 12/21/2021 16  0 - 44 U/L Final   Alkaline Phosphatase 12/21/2021 57  38 - 126 U/L Final   Total Bilirubin 12/21/2021 0.3  0.3 - 1.2 mg/dL Final   GFR, Estimated  12/21/2021 >60  >60 mL/min Final   Comment: (NOTE) Calculated using the CKD-EPI Creatinine Equation (2021)    Anion gap 12/21/2021 5  5 - 15 Final   Performed at [facility], [address], [place], [place] [address]   Hgb A1c MFr Bld 12/21/2021 5.0  4.8 - 5.6 % Final   Comment: (NOTE) Pre diabetes:          5.7%-6.4%  Diabetes:              >6.4%  Glycemic control for   <7.0% adults with diabetes    Mean Plasma Glucose 12/21/2021 96.8  mg/dL Final   Performed at [facility], [address], [place], [place] [address]   Alcohol, Ethyl (B) 12/21/2021 <10  <10 mg/dL Final   Comment: (NOTE) Lowest detectable limit for serum alcohol is 10 mg/dL.  For medical purposes only. Performed at [facility], [address] [place], [place] [address]    TSH 12/21/2021 3.113  0.350 - 4.500 uIU/mL Final   Comment: Performed by a 3rd Generation assay with a functional sensitivity of <=0.01 uIU/mL. Performed at [facility], [address], [place], [place] [address]    SARSCOV2ONAVIRUS 2 AG 12/21/2021 NEGATIVE  NEGATIVE Final   Comment: (NOTE) SARS-CoV-2 antigen NOT DETECTED.   Negative results are presumptive.  Negative results do not preclude SARS-CoV-2 infection and should not be used as the sole basis for treatment or  other patient management decisions, including infection  control decisions, particularly in the presence of clinical signs and  symptoms consistent with COVID-19, or in those who have been in contact with the virus.  Negative results must be combined with clinical observations, patient history, and epidemiological information. The expected result is Negative.  Fact Sheet for Patients: https://www.jennings-kim.com/  Fact Sheet for Healthcare Providers: https://alexander-rogers.biz/  This test is not yet approved or cleared by the [place] FDA and  has been authorized for detection and/or diagnosis of SARS-CoV-2  by FDA under an Emergency Use Authorization (EUA).  This EUA will remain in effect (meaning this test can be used) for the duration of  the COV                          ID-19 declaration under Section 564(b)(1) of the Act, 21 U.S.C. section 360bbb-3(b)(1), unless the authorization is terminated or revoked sooner.     Cholesterol 12/21/2021 146  0 - 200 mg/dL Final   Triglycerides 09/81/1914 94  <150 mg/dL Final   HDL 78/29/5621 67  >40 mg/dL Final   Total CHOL/HDL Ratio 12/21/2021 2.2  RATIO Final   VLDL 12/21/2021 19  0 - 40 mg/dL Final   LDL Cholesterol 12/21/2021 60  0 - 99 mg/dL Final   Comment:        Total Cholesterol/HDL:CHD Risk Coronary Heart Disease Risk Table                     Men   Women  1/2 Average Risk   3.4   3.3  Average Risk       5.0   4.4  2 X Average Risk   9.6   7.1  3 X Average Risk  23.4   11.0        Use the calculated Patient Ratio above and the CHD Risk Table to determine the patient's CHD Risk.        ATP III CLASSIFICATION (LDL):  <100     mg/dL   Optimal  308-657  mg/dL   Near or Above                    Optimal  130-159  mg/dL   Borderline  846-962  mg/dL   High  >952     mg/dL   Very High Performed at [facility], [address], [place], [place] [address]   Admission on 08/31/2021, Discharged on 09/01/2021  Component Date Value Ref Range Status   SARS Coronavirus 2 by RT PCR 08/31/2021 NEGATIVE  NEGATIVE Final   Comment: (NOTE) SARS-CoV-2 target nucleic acids are NOT DETECTED.  The SARS-CoV-2 RNA is generally detectable in upper respiratory specimens during the acute phase of infection. The lowest concentration of SARS-CoV-2 viral copies this assay can detect is 138 copies/mL. A negative result does not preclude SARS-Cov-2 infection and should not be used as the sole basis for treatment or other patient management decisions. A negative result may occur with  improper specimen collection/handling, submission of specimen  other than nasopharyngeal swab, presence of viral mutation(s) within the areas targeted by this assay, and inadequate number of viral copies(<138 copies/mL). A negative result must be combined with clinical observations, patient history, and epidemiological information. The expected result is Negative.  Fact Sheet for Patients:  BloggerCourse.com  Fact Sheet for Healthcare Providers:  SeriousBroker.it  This test is no  t yet approved or cleared by the [place] and  has been authorized for detection and/or diagnosis of SARS-CoV-2 by FDA under an Emergency Use Authorization (EUA). This EUA will remain  in effect (meaning this test can be used) for the duration of the COVID-19 declaration under Section 564(b)(1) of the Act, 21 U.S.C.section 360bbb-3(b)(1), unless the authorization is terminated  or revoked sooner.       Influenza A by PCR 08/31/2021 NEGATIVE  NEGATIVE Final   Influenza B by PCR 08/31/2021 NEGATIVE  NEGATIVE Final   Comment: (NOTE) The Xpert Xpress SARS-CoV-2/FLU/RSV plus assay is intended as an aid in the diagnosis of influenza from Nasopharyngeal swab specimens and should not be used as a sole basis for treatment. Nasal washings and aspirates are unacceptable for Xpert Xpress SARS-CoV-2/FLU/RSV testing.  Fact Sheet for Patients: BloggerCourse.com  Fact Sheet for Healthcare Providers: SeriousBroker.it  This test is not yet approved or cleared by the [place] FDA and has been authorized for detection and/or diagnosis of SARS-CoV-2 by FDA under an Emergency Use Authorization (EUA). This EUA will remain in effect (meaning this test can be used) for the duration of the COVID-19 declaration under Section 564(b)(1) of the Act, 21 U.S.C. section 360bbb-3(b)(1), unless the authorization is terminated or revoked.  Performed at  [facility], [address], [place], [place] [address]    WBC 08/31/2021 6.3  4.0 - 10.5 K/uL Final   RBC 08/31/2021 4.32  4.22 - 5.81 MIL/uL Final   Hemoglobin 08/31/2021 13.4  13.0 - 17.0 g/dL Final   HCT 50/93/2671 39.9  39.0 - 52.0 % Final   MCV 08/31/2021 92.4  80.0 - 100.0 fL Final   MCH 08/31/2021 31.0  26.0 - 34.0 pg Final   MCHC 08/31/2021 33.6  30.0 - 36.0 g/dL Final   RDW 24/58/0998 13.6  11.5 - 15.5 % Final   Platelets 08/31/2021 230  150 - 400 K/uL Final   nRBC 08/31/2021 0.0  0.0 - 0.2 % Final   Neutrophils Relative % 08/31/2021 64  % Final   Neutro Abs 08/31/2021 4.0  1.7 - 7.7 K/uL Final   Lymphocytes Relative 08/31/2021 25  % Final   Lymphs Abs 08/31/2021 1.6  0.7 - 4.0 K/uL Final   Monocytes Relative 08/31/2021 9  % Final   Monocytes Absolute 08/31/2021 0.6  0.1 - 1.0 K/uL Final   Eosinophils Relative 08/31/2021 1  % Final   Eosinophils Absolute 08/31/2021 0.1  0.0 - 0.5 K/uL Final   Basophils Relative 08/31/2021 1  % Final   Basophils Absolute 08/31/2021 0.0  0.0 - 0.1 K/uL Final   Immature Granulocytes 08/31/2021 0  % Final   Abs Immature Granulocytes 08/31/2021 0.01  0.00 - 0.07 K/uL Final   Performed at [facility], [address] [place], [place] [address]   Sodium 08/31/2021 140  135 - 145 mmol/L Final   Potassium 08/31/2021 4.7  3.5 - 5.1 mmol/L Final   Chloride 08/31/2021 107  98 - 111 mmol/L Final   CO2 08/31/2021 26  22 - 32 mmol/L Final   Glucose, Bld 08/31/2021 105 (H)  70 - 99 mg/dL Final   Glucose reference range applies only to samples taken after fasting for at least 8 hours.   BUN 08/31/2021 17  6 - 20 mg/dL Final   Creatinine, Ser 08/31/2021 0.73  0.61 - 1.24 mg/dL Final   Calcium 05/39/7673 9.3  8.9 - 10.3 mg/dL Final   Total  Protein 08/31/2021 5.7 (L)  6.5 - 8.1 g/dL Final   Albumin 45/40/9811 3.9  3.5 - 5.0 g/dL Final   AST 91/47/8295 20  15 - 41 U/L Final   ALT 08/31/2021 16  0 - 44 U/L Final   Alkaline Phosphatase  08/31/2021 76  38 - 126 U/L Final   Total Bilirubin 08/31/2021 0.7  0.3 - 1.2 mg/dL Final   GFR, Estimated 08/31/2021 >60  >60 mL/min Final   Comment: (NOTE) Calculated using the CKD-EPI Creatinine Equation (2021)    Anion gap 08/31/2021 7  5 - 15 Final   Performed at [facility], [address] [place], [place] [address]   Alcohol, Ethyl (B) 08/31/2021 <10  <10 mg/dL Final   Comment: (NOTE) Lowest detectable limit for serum alcohol is 10 mg/dL.  For medical purposes only. Performed at [facility], [address] [place], [place] [address]    POC Amphetamine UR 08/31/2021 None Detected  NONE DETECTED (Cut Off Level 1000 ng/mL) Preliminary   POC Secobarbital (BAR) 08/31/2021 None Detected  NONE DETECTED (Cut Off Level 300 ng/mL) Preliminary   POC Buprenorphine (BUP) 08/31/2021 None Detected  NONE DETECTED (Cut Off Level 10 ng/mL) Preliminary   POC Oxazepam (BZO) 08/31/2021 None Detected  NONE DETECTED (Cut Off Level 300 ng/mL) Preliminary   POC Cocaine UR 08/31/2021 None Detected  NONE DETECTED (Cut Off Level 300 ng/mL) Preliminary   POC Methamphetamine UR 08/31/2021 None Detected  NONE DETECTED (Cut Off Level 1000 ng/mL) Preliminary   POC Morphine 08/31/2021 None Detected  NONE DETECTED (Cut Off Level 300 ng/mL) Preliminary   POC Oxycodone UR 08/31/2021 None Detected  NONE DETECTED (Cut Off Level 100 ng/mL) Preliminary   POC Methadone UR 08/31/2021 None Detected  NONE DETECTED (Cut Off Level 300 ng/mL) Preliminary   POC Marijuana UR 08/31/2021 None Detected  NONE DETECTED (Cut Off Level 50 ng/mL) Preliminary   SARSCOV2ONAVIRUS 2 AG 08/31/2021 NEGATIVE  NEGATIVE Final   Comment: (NOTE) SARS-CoV-2 antigen NOT DETECTED.   Negative results are presumptive.  Negative results do not preclude SARS-CoV-2 infection and should not be used as the sole basis for treatment or other patient management decisions, including infection  control decisions, particularly in the presence  of clinical signs and  symptoms consistent with COVID-19, or in those who have been in contact with the virus.  Negative results must be combined with clinical observations, patient history, and epidemiological information. The expected result is Negative.  Fact Sheet for Patients: https://www.jennings-kim.com/  Fact Sheet for Healthcare Providers: https://alexander-rogers.biz/  This test is not yet approved or cleared by the [place] FDA and  has been authorized for detection and/or diagnosis of SARS-CoV-2 by FDA under an Emergency Use Authorization (EUA).  This EUA will remain in effect (meaning this test can be used) for the duration of  the COV                          ID-19 declaration under Section 564(b)(1) of the Act, 21 U.S.C. section 360bbb-3(b)(1), unless the authorization is terminated or revoked sooner.    Admission on 08/29/2021, Discharged on 08/29/2021  Component Date Value Ref Range Status   Glucose-Capillary 08/29/2021 100 (H)  70 - 99 mg/dL Final   Glucose reference range applies only to samples taken after fasting for at least 8 hours.  Admission on 08/27/2021, Discharged on 08/28/2021  Component Date Value Ref Range Status   Sodium 08/27/2021 136  135 - 145 mmol/L Final   Potassium 08/27/2021 4.2  3.5 - 5.1 mmol/L Final   Chloride 08/27/2021 104  98 - 111 mmol/L Final   CO2 08/27/2021 25  22 - 32 mmol/L Final   Glucose, Bld 08/27/2021 97  70 - 99 mg/dL Final   Glucose reference range applies only to samples taken after fasting for at least 8 hours.   BUN 08/27/2021 14  6 - 20 mg/dL Final   Creatinine, Ser 08/27/2021 0.59 (L)  0.61 - 1.24 mg/dL Final   Calcium 16/01/9603 9.5  8.9 - 10.3 mg/dL Final   Total Protein 54/12/8117 6.9  6.5 - 8.1 g/dL Final   Albumin 14/78/2956 4.2  3.5 - 5.0 g/dL Final   AST 21/30/8657 16  15 - 41 U/L Final   ALT 08/27/2021 14  0 - 44 U/L Final   Alkaline Phosphatase 08/27/2021 74  38 - 126 U/L Final    Total Bilirubin 08/27/2021 0.5  0.3 - 1.2 mg/dL Final   GFR, Estimated 08/27/2021 >60  >60 mL/min Final   Comment: (NOTE) Calculated using the CKD-EPI Creatinine Equation (2021)    Anion gap 08/27/2021 7  5 - 15 Final   Performed at [facility], [address], [place], [place] [address]   WBC 08/27/2021 6.5  4.0 - 10.5 K/uL Final   RBC 08/27/2021 4.49  4.22 - 5.81 MIL/uL Final   Hemoglobin 08/27/2021 14.1  13.0 - 17.0 g/dL Final   HCT 29/52/8413 41.2  39.0 - 52.0 % Final   MCV 08/27/2021 91.8  80.0 - 100.0 fL Final   MCH 08/27/2021 31.4  26.0 - 34.0 pg Final   MCHC 08/27/2021 34.2  30.0 - 36.0 g/dL Final   RDW 24/40/1027 14.1  11.5 - 15.5 % Final   Platelets 08/27/2021 218  150 - 400 K/uL Final   nRBC 08/27/2021 0.0  0.0 - 0.2 % Final   Performed at [facility], [address], [place], [place] [address]   Glucose-Capillary 08/27/2021 116 (H)  70 - 99 mg/dL Final   Glucose reference range applies only to samples taken after fasting for at least 8 hours.   Acetaminophen (Tylenol), Serum 08/27/2021 <10 (L)  10 - 30 ug/mL Final   Comment: (NOTE) Therapeutic concentrations vary significantly. A range of 10-30 ug/mL  may be an effective concentration for many patients. However, some  are best treated at concentrations outside of this range. Acetaminophen concentrations >150 ug/mL at 4 hours after ingestion  and >50 ug/mL at 12 hours after ingestion are often associated with  toxic reactions.  Performed at [facility], [address], [place], [place] [address]    Opiates 08/27/2021 NONE DETECTED  NONE DETECTED Final   Cocaine 08/27/2021 NONE DETECTED  NONE DETECTED Final   Benzodiazepines 08/27/2021 NONE DETECTED  NONE DETECTED Final   Amphetamines 08/27/2021 NONE DETECTED  NONE DETECTED Final   Tetrahydrocannabinol 08/27/2021 NONE DETECTED  NONE DETECTED Final   Barbiturates 08/27/2021 NONE DETECTED  NONE DETECTED Final    Comment: (NOTE) DRUG SCREEN FOR MEDICAL PURPOSES ONLY.  IF CONFIRMATION IS NEEDED FOR ANY PURPOSE, NOTIFY LAB WITHIN 5 DAYS.  LOWEST DETECTABLE LIMITS FOR URINE DRUG SCREEN Drug Class                     Cutoff (ng/mL) Amphetamine and metabolites    1000 Barbiturate and metabolites    200 Benzodiazepine  200 Tricyclics and metabolites     300 Opiates and metabolites        300 Cocaine and metabolites        300 THC                            50 Performed at [facility], [address], [place], [place] [address]    Salicylate Lvl 08/27/2021 <7.0 (L)  7.0 - 30.0 mg/dL Final   Performed at [facility], [address] [place], [place] [address]   Alcohol, Ethyl (B) 08/27/2021 <10  <10 mg/dL Final   Comment: (NOTE) Lowest detectable limit for serum alcohol is 10 mg/dL.  For medical purposes only. Performed at [facility], [address], [place], [place] [address]    SARS Coronavirus 2 by RT PCR 08/27/2021 NEGATIVE  NEGATIVE Final   Comment: (NOTE) SARS-CoV-2 target nucleic acids are NOT DETECTED.  The SARS-CoV-2 RNA is generally detectable in upper respiratory specimens during the acute phase of infection. The lowest concentration of SARS-CoV-2 viral copies this assay can detect is 138 copies/mL. A negative result does not preclude SARS-Cov-2 infection and should not be used as the sole basis for treatment or other patient management decisions. A negative result may occur with  improper specimen collection/handling, submission of specimen other than nasopharyngeal swab, presence of viral mutation(s) within the areas targeted by this assay, and inadequate number of viral copies(<138 copies/mL). A negative result must be combined with clinical observations, patient history, and epidemiological information. The expected result is Negative.  Fact Sheet for Patients:   BloggerCourse.com  Fact Sheet for Healthcare Providers:  SeriousBroker.it  This test is no                          t yet approved or cleared by the [place] FDA and  has been authorized for detection and/or diagnosis of SARS-CoV-2 by FDA under an Emergency Use Authorization (EUA). This EUA will remain  in effect (meaning this test can be used) for the duration of the COVID-19 declaration under Section 564(b)(1) of the Act, 21 U.S.C.section 360bbb-3(b)(1), unless the authorization is terminated  or revoked sooner.       Influenza A by PCR 08/27/2021 NEGATIVE  NEGATIVE Final   Influenza B by PCR 08/27/2021 NEGATIVE  NEGATIVE Final   Comment: (NOTE) The Xpert Xpress SARS-CoV-2/FLU/RSV plus assay is intended as an aid in the diagnosis of influenza from Nasopharyngeal swab specimens and should not be used as a sole basis for treatment. Nasal washings and aspirates are unacceptable for Xpert Xpress SARS-CoV-2/FLU/RSV testing.  Fact Sheet for Patients: BloggerCourse.com  Fact Sheet for Healthcare Providers: SeriousBroker.it  This test is not yet approved or cleared by the [place] FDA and has been authorized for detection and/or diagnosis of SARS-CoV-2 by FDA under an Emergency Use Authorization (EUA). This EUA will remain in effect (meaning this test can be used) for the duration of the COVID-19 declaration under Section 564(b)(1) of the Act, 21 U.S.C. section 360bbb-3(b)(1), unless the authorization is terminated or revoked.  Performed at [facility], [address] [place], [place] [address]   Admission on 08/18/2021, Discharged on 08/19/2021  Component Date Value Ref Range Status   SARS Coronavirus 2 by RT PCR 08/18/2021 NEGATIVE  NEGATIVE Final   Comment: (NOTE) SARS-CoV-2 target nucleic acids are NOT DETECTED.  The SARS-CoV-2  RNA is  generally detectable in upper respiratory specimens during the acute phase of infection. The lowest concentration of SARS-CoV-2 viral copies this assay can detect is 138 copies/mL. A negative result does not preclude SARS-Cov-2 infection and should not be used as the sole basis for treatment or other patient management decisions. A negative result may occur with  improper specimen collection/handling, submission of specimen other than nasopharyngeal swab, presence of viral mutation(s) within the areas targeted by this assay, and inadequate number of viral copies(<138 copies/mL). A negative result must be combined with clinical observations, patient history, and epidemiological information. The expected result is Negative.  Fact Sheet for Patients:  BloggerCourse.com  Fact Sheet for Healthcare Providers:  SeriousBroker.it  This test is no                          t yet approved or cleared by the [place] FDA and  has been authorized for detection and/or diagnosis of SARS-CoV-2 by FDA under an Emergency Use Authorization (EUA). This EUA will remain  in effect (meaning this test can be used) for the duration of the COVID-19 declaration under Section 564(b)(1) of the Act, 21 U.S.C.section 360bbb-3(b)(1), unless the authorization is terminated  or revoked sooner.       Influenza A by PCR 08/18/2021 NEGATIVE  NEGATIVE Final   Influenza B by PCR 08/18/2021 NEGATIVE  NEGATIVE Final   Comment: (NOTE) The Xpert Xpress SARS-CoV-2/FLU/RSV plus assay is intended as an aid in the diagnosis of influenza from Nasopharyngeal swab specimens and should not be used as a sole basis for treatment. Nasal washings and aspirates are unacceptable for Xpert Xpress SARS-CoV-2/FLU/RSV testing.  Fact Sheet for Patients: BloggerCourse.com  Fact Sheet for Healthcare Providers: SeriousBroker.it  This  test is not yet approved or cleared by the [place] FDA and has been authorized for detection and/or diagnosis of SARS-CoV-2 by FDA under an Emergency Use Authorization (EUA). This EUA will remain in effect (meaning this test can be used) for the duration of the COVID-19 declaration under Section 564(b)(1) of the Act, 21 U.S.C. section 360bbb-3(b)(1), unless the authorization is terminated or revoked.  Performed at [facility], [address] [place], [place] [address]     Allergies: Patient has no known allergies.  PTA Medications: (Not in a hospital admission)   Medical Decision Making  Inpatient observation   Lab Orders         Resp Panel by RT-PCR (Flu A&B, Covid) Anterior Nasal Swab         CBC with Differential/Platelet         Comprehensive metabolic panel         Hemoglobin A1c         Ethanol         TSH         Lipid panel         POCT Urine Drug Screen - (I-Screen)         POC SARS Coronavirus 2 Ag      Meds ordered this encounter  Medications   acetaminophen (TYLENOL) tablet 650 mg   alum & mag hydroxide-simeth (MAALOX/MYLANTA) 200-200-20 MG/5ML suspension 30 mL   magnesium hydroxide (MILK OF MAGNESIA) suspension 30 mL   citalopram (CELEXA) tablet 10 mg   risperiDONE (RISPERDAL) tablet 1 mg     Recommendations  Based on my evaluation the patient does not appear to have an emergency medical condition.  [name]  Mayford Knife, NP 12/21/21  5:51 AM

## 2021-12-21 NOTE — BH Assessment (Signed)
TTS clinician attempted to complete assessment. Patient was too drowsy. Per Tobi Bastos, RN, patient was given Ativan. TTS will attempt at later time.

## 2021-12-21 NOTE — ED Notes (Signed)
This RN introduced self to pt. Pt calm and cooperative, no reported needs at this time

## 2021-12-21 NOTE — ED Triage Notes (Signed)
Pt states "I'm dying, and they won't let me look at him in the eye," referring to [place], [place]. When asked about physical pain, he states he is "hurting all over & can't survive anymore without myself." Denies HI, states that if he's allowed to "leave this hospital, I'll bust my head open on the concrete, find me laying dead on the concrete." When asked about [facility] visit today, he states, "they didn't do nothin' for me, they just let me go. I guess they want me to die."  Pt calm, cooperative in triage, NAD noted.

## 2021-12-21 NOTE — ED Notes (Signed)
Patient is A&O x4. Patient denies SI/HI and AVH at this time. However, patient states "if I am discharged I'm die".  Patient denies any physical complaints when asked. No acute distress noted. Support and encouragement provided. Routine safety checks conducted according to facility protocol. Encouraged patient to notify staff if thoughts of harm toward self or others arise. Patient verbalize understanding and agreement. Will continue to monitor for safety.

## 2021-12-21 NOTE — ED Notes (Signed)
Pt provided with turkey sandwich and water
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
## 2021-12-22 NOTE — Discharge Instructions (Signed)
It was our pleasure to provide your ER care today - we hope that you feel better.  Follow up closely with primary care doctor and behavioral health provider in the next 1-2 weeks - see resource guide provided.  Also see additional resource guides provided in terms of other social services and shelter information in the area.   For mental health issues and/or crisis, you may also go directly to the [facility] - it is open 24/7 and walk-ins are welcome.   Return to ER if worse, new symptoms, fevers, chest pain, trouble breathing, or other emergency concern.

## 2021-12-22 NOTE — ED Notes (Signed)
ED Provider at bedside. 

## 2021-12-22 NOTE — ED Notes (Signed)
Breakfast order placed ?

## 2021-12-22 NOTE — ED Notes (Signed)
[facility] RN Annabelle Harman states patient is voluntary, no IVC docs to review.

## 2021-12-29 ENCOUNTER — Other Ambulatory Visit: Payer: Self-pay

## 2021-12-29 ENCOUNTER — Encounter (HOSPITAL_COMMUNITY): Payer: Self-pay

## 2021-12-29 ENCOUNTER — Emergency Department (HOSPITAL_COMMUNITY)
Admission: EM | Admit: 2021-12-29 | Discharge: 2021-12-30 | Disposition: A | Discharge status: Other Acute Inpatient Hospital | Payer: Medicare PPO | Attending: Emergency Medicine | Admitting: Emergency Medicine

## 2021-12-29 DIAGNOSIS — Z79899 Other long term (current) drug therapy: Secondary | ICD-10-CM | POA: Diagnosis not present

## 2021-12-29 DIAGNOSIS — Z1339 Encounter for screening examination for other mental health and behavioral disorders: Secondary | ICD-10-CM | POA: Diagnosis not present

## 2021-12-29 DIAGNOSIS — R45851 Suicidal ideations: Secondary | ICD-10-CM | POA: Diagnosis not present

## 2021-12-29 DIAGNOSIS — F99 Mental disorder, not otherwise specified: Secondary | ICD-10-CM | POA: Insufficient documentation

## 2021-12-29 LAB — CBC WITH DIFFERENTIAL/PLATELET
Abs Immature Granulocytes: 0.03 10*3/uL (ref 0.00–0.07)
Basophils Absolute: 0 10*3/uL (ref 0.0–0.1)
Basophils Relative: 1 %
Eosinophils Absolute: 0.2 10*3/uL (ref 0.0–0.5)
Eosinophils Relative: 2 %
HCT: 38 % — ABNORMAL LOW (ref 39.0–52.0)
Hemoglobin: 12.2 g/dL — ABNORMAL LOW (ref 13.0–17.0)
Immature Granulocytes: 0 %
Lymphocytes Relative: 22 %
Lymphs Abs: 1.9 10*3/uL (ref 0.7–4.0)
MCH: 29.8 pg (ref 26.0–34.0)
MCHC: 32.1 g/dL (ref 30.0–36.0)
MCV: 92.9 fL (ref 80.0–100.0)
Monocytes Absolute: 0.6 10*3/uL (ref 0.1–1.0)
Monocytes Relative: 7 %
Neutro Abs: 5.8 10*3/uL (ref 1.7–7.7)
Neutrophils Relative %: 68 %
Platelets: 208 10*3/uL (ref 150–400)
RBC: 4.09 MIL/uL — ABNORMAL LOW (ref 4.22–5.81)
RDW: 14.5 % (ref 11.5–15.5)
WBC: 8.6 10*3/uL (ref 4.0–10.5)
nRBC: 0 % (ref 0.0–0.2)

## 2021-12-29 LAB — RAPID URINE DRUG SCREEN, HOSP PERFORMED
Amphetamines: NOT DETECTED
Barbiturates: NOT DETECTED
Benzodiazepines: NOT DETECTED
Cocaine: NOT DETECTED
Opiates: NOT DETECTED
Tetrahydrocannabinol: NOT DETECTED
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
## 2021-12-30 ENCOUNTER — Other Ambulatory Visit: Payer: Self-pay

## 2021-12-30 ENCOUNTER — Emergency Department (HOSPITAL_COMMUNITY)
Admission: EM | Admit: 2021-12-30 | Discharge: 2021-12-30 | Disposition: A | Discharge status: Home / Self Care | Payer: Medicare PPO | Attending: Emergency Medicine | Admitting: Emergency Medicine

## 2021-12-30 ENCOUNTER — Encounter (HOSPITAL_COMMUNITY): Payer: Self-pay

## 2021-12-30 DIAGNOSIS — Z5900 Homelessness unspecified: Secondary | ICD-10-CM | POA: Diagnosis present

## 2021-12-30 DIAGNOSIS — R000 Tachycardia, unspecified: Secondary | ICD-10-CM | POA: Insufficient documentation

## 2021-12-30 LAB — COMPREHENSIVE METABOLIC PANEL
ALT: 12 U/L (ref 0–44)
AST: 13 U/L — ABNORMAL LOW (ref 15–41)
Albumin: 3.4 g/dL — ABNORMAL LOW (ref 3.5–5.0)
Alkaline Phosphatase: 52 U/L (ref 38–126)
Anion gap: 8 (ref 5–15)
BUN: 20 mg/dL (ref 6–20)
CO2: 25 mmol/L (ref 22–32)
Calcium: 9.3 mg/dL (ref 8.9–10.3)
Chloride: 106 mmol/L (ref 98–111)
Creatinine, Ser: 0.87 mg/dL (ref 0.61–1.24)
GFR, Estimated: >60 mL/min (ref >60)
Glucose, Bld: 106 mg/dL — ABNORMAL HIGH (ref 70–99)
Potassium: 4 mmol/L (ref 3.5–5.1)
Sodium: 139 mmol/L (ref 135–145)
Total Bilirubin: 0.4 mg/dL (ref 0.3–1.2)
Total Protein: 5.8 g/dL — ABNORMAL LOW (ref 6.5–8.1)

## 2021-12-30 LAB — ACETAMINOPHEN LEVEL: Acetaminophen (Tylenol), Serum: <10 ug/mL — ABNORMAL LOW (ref 10–30)

## 2021-12-30 LAB — ETHANOL: Alcohol, Ethyl (B): <10 mg/dL (ref <10)

## 2021-12-30 LAB — SALICYLATE LEVEL: Salicylate Lvl: <7 mg/dL — ABNORMAL LOW (ref 7.0–30.0)

## 2021-12-30 NOTE — ED Triage Notes (Signed)
Patient reports he can't lay in the grass any more.  Wants a place to stay bc he says he is going to die out there.  Patient says he punched himself in the face.  Keep stating \"we should house him since he is homeless and he is going to die if he keeps having to lay in the grass"

## 2021-12-30 NOTE — ED Notes (Addendum)
Patient arrived via safe transport to [facility], exited the [place] and report that he did not like this place and left walking down the road.  Patient refused to come in for an assessment after verbal encouragement from staff at the rear sally port

## 2021-12-30 NOTE — ED Notes (Signed)
Pt to [facility] via [employer]. Pt A&Ox4, NAD noted. Pt ambulatory to car. Report called to [place] at [facility]. Paperwork w/pt.

## 2021-12-30 NOTE — Discharge Instructions (Addendum)
You presented to the emergency department requesting housing assistance.  I have attached a list of shelters in the area and we have provided you with a cab voucher.

## 2021-12-31 NOTE — ED Provider Notes (Signed)
  [facility] EMERGENCY DEPARTMENT Provider Note   CSN: [id number] Arrival date & time: 12/30/21  1622     History  Chief Complaint  Patient presents with   Homeless    Roberto Osborn is a 51 y.o. male with history of adjustment disorder, depression, homelessness who presents the emergency department requesting housing assistance.  Patient reported to triage staff that he "cannot lay in the grass anymore and wants a place to stay".  No other complaints.  HPI     Home Medications Prior to Admission medications   Medication Sig Start Date End Date Taking? Authorizing Provider  citalopram (CELEXA) 10 MG tablet Take 1 tablet (10 mg total) by mouth daily. 08/28/21 09/27/21  Ernie Avena, MD  risperiDONE (RISPERDAL) 1 MG tablet Take 1 tablet (1 mg total) by mouth at bedtime. 08/28/21 09/27/21  Ernie Avena, MD      Allergies    Risperdal [risperidone]    Review of Systems   Review of Systems  All other systems reviewed and are negative.   Physical Exam Updated Vital Signs BP 119/71 (BP Location: Right Arm)   Pulse (!) 118   Temp 99.1 F (37.3 C) (Oral)   Resp 17   Ht 6\' 1"  (1.854 m)   Wt 88.5 kg   SpO2 98%   BMI 25.73 kg/m  Physical Exam Vitals and nursing note reviewed.  Constitutional:      Comments: Disheveled in appearance  HENT:     Head: Normocephalic and atraumatic.  Eyes:     Conjunctiva/sclera: Conjunctivae normal.  Pulmonary:     Effort: Pulmonary effort is normal. No respiratory distress.  Skin:    General: Skin is warm and dry.  Neurological:     Mental Status: He is alert.  Psychiatric:        Mood and Affect: Mood normal.        Behavior: Behavior normal.     ED Results / Procedures / Treatments   Labs (all labs ordered are listed, but only abnormal results are displayed) Labs Reviewed - No data to display  EKG None  Radiology No results found.  Procedures Procedures    Medications Ordered in ED Medications -  No data to display  ED Course/ Medical Decision Making/ A&P                           Medical Decision Making  Patient is a 51 year old male with history of adjustment disorder, depression, homelessness who presents the emergency department requesting housing assistance.  Per chart review patient has had 11 ER visits in the past 6 months for similar symptoms including housing instability and behavioral disturbances.  Most recently on 8/30.  At that time he had medical screening and TTS evaluation at [facility].  On exam patient is disheveled in appearance. Tachycardic, but otherwise normal vital signs. No other complaints.  Patient given housing resources and discharged in stable condition.         Final Clinical Impression(s) / ED Diagnoses Final diagnoses:  Homelessness    Rx / DC Orders ED Discharge Orders     None      Portions of this report may have been transcribed using voice recognition software. Every effort was made to ensure accuracy; however, inadvertent computerized transcription errors may be present.    [facility] 12/31/21 1836    03/02/22, MD 12/31/21 2226

## 2022-01-04 ENCOUNTER — Other Ambulatory Visit: Payer: Self-pay

## 2022-01-04 ENCOUNTER — Emergency Department (HOSPITAL_COMMUNITY)
Admission: EM | Admit: 2022-01-04 | Discharge: 2022-01-04 | Disposition: A | Discharge status: Home / Self Care | Payer: Medicare PPO | Attending: Student | Admitting: Student

## 2022-01-04 ENCOUNTER — Emergency Department (HOSPITAL_COMMUNITY): Payer: Medicare PPO

## 2022-01-04 DIAGNOSIS — M25562 Pain in left knee: Secondary | ICD-10-CM | POA: Diagnosis not present

## 2022-01-04 DIAGNOSIS — Z5900 Homelessness unspecified: Secondary | ICD-10-CM | POA: Insufficient documentation

## 2022-01-04 DIAGNOSIS — M79605 Pain in left leg: Secondary | ICD-10-CM | POA: Diagnosis not present

## 2022-01-04 DIAGNOSIS — F17210 Nicotine dependence, cigarettes, uncomplicated: Secondary | ICD-10-CM | POA: Insufficient documentation

## 2022-01-04 DIAGNOSIS — G8929 Other chronic pain: Secondary | ICD-10-CM | POA: Diagnosis not present

## 2022-01-04 DIAGNOSIS — R45851 Suicidal ideations: Secondary | ICD-10-CM | POA: Diagnosis not present

## 2022-01-04 DIAGNOSIS — W19XXXA Unspecified fall, initial encounter: Secondary | ICD-10-CM | POA: Diagnosis not present

## 2022-01-04 LAB — CBC
HCT: 40 % (ref 39.0–52.0)
Hemoglobin: 13 g/dL (ref 13.0–17.0)
MCH: 29.9 pg (ref 26.0–34.0)
MCHC: 32.5 g/dL (ref 30.0–36.0)
MCV: 92 fL (ref 80.0–100.0)
Platelets: 204 10*3/uL (ref 150–400)
RBC: 4.35 MIL/uL (ref 4.22–5.81)
RDW: 13.8 % (ref 11.5–15.5)
WBC: 6.2 10*3/uL (ref 4.0–10.5)
nRBC: 0 % (ref 0.0–0.2)

## 2022-01-04 LAB — COMPREHENSIVE METABOLIC PANEL
ALT: 13 U/L (ref 0–44)
AST: 15 U/L (ref 15–41)
Albumin: 3.8 g/dL (ref 3.5–5.0)
Alkaline Phosphatase: 58 U/L (ref 38–126)
Anion gap: 6 (ref 5–15)
BUN: 15 mg/dL (ref 6–20)
CO2: 28 mmol/L (ref 22–32)
Calcium: 9 mg/dL (ref 8.9–10.3)
Chloride: 107 mmol/L (ref 98–111)
Creatinine, Ser: 0.77 mg/dL (ref 0.61–1.24)
GFR, Estimated: >60 mL/min (ref >60)
Glucose, Bld: 75 mg/dL (ref 70–99)
Potassium: 3.8 mmol/L (ref 3.5–5.1)
Sodium: 141 mmol/L (ref 135–145)
Total Bilirubin: 0.5 mg/dL (ref 0.3–1.2)
Total Protein: 6.2 g/dL — ABNORMAL LOW (ref 6.5–8.1)

## 2022-01-04 LAB — RAPID URINE DRUG SCREEN, HOSP PERFORMED
Amphetamines: NOT DETECTED
Barbiturates: NOT DETECTED
Benzodiazepines: NOT DETECTED
Cocaine: NOT DETECTED
Opiates: NOT DETECTED
Tetrahydrocannabinol: NOT DETECTED

## 2022-01-04 LAB — ETHANOL: Alcohol, Ethyl (B): <10 mg/dL (ref <10)

## 2022-01-04 LAB — SALICYLATE LEVEL: Salicylate Lvl: <7 mg/dL — ABNORMAL LOW (ref 7.0–30.0)

## 2022-01-04 LAB — ACETAMINOPHEN LEVEL: Acetaminophen (Tylenol), Serum: <10 ug/mL — ABNORMAL LOW (ref 10–30)

## 2022-01-04 MED ORDER — NAPROXEN 500 MG PO TABS
500.0000 mg | ORAL_TABLET | Freq: Once | ORAL | Status: AC
  Administered 2022-01-04: 500 mg via ORAL
  Filled 2022-01-04: qty 1

## 2022-01-04 NOTE — ED Provider Notes (Signed)
[facility]-EMERGENCY DEPT Provider Note  CSN: [id number] Arrival date & time: 01/04/22 1724  Chief Complaint(s) Knee Pain and Suicidal  HPI Roberto Osborn is a 51 y.o. male with PMH homelessness, major depression who presents emergency department for evaluation of knee pain and reported suicidal ideation.  On my evaluation, patient adamantly denies suicidal ideation and is also denying homicidal ideation, auditory or visual hallucinations.  He states that he had surgery on his knee 40 years ago and feels like his knee gives out every once in a while on the left.  He states he does not like the homeless shelter he is currently staying at.  Denies chest pain, shortness of breath, abdominal pain, nausea, vomiting or other systemic symptoms.   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 Authorizing Provider  citalopram (CELEXA) 10 MG tablet Take 1 tablet (10 mg total) by mouth daily. 08/28/21 09/27/21  Ernie Avena, MD  risperiDONE (RISPERDAL) 1 MG tablet Take 1 tablet (1 mg total) by mouth at bedtime. 08/28/21 09/27/21  Ernie Avena, MD                                                                                                                                    Past Surgical History No past surgical history on file. Family History No family history on file.  Social History Social History   Tobacco Use   Smoking status: Every Day    Types: Cigarettes, Cigars  Vaping Use   Vaping Use: Never used  Substance Use Topics    Alcohol use: Yes    Comment: occasionally   Drug use: Not Currently    Types: Marijuana   Allergies Risperdal [risperidone]  Review of Systems Review of Systems  Musculoskeletal:  Positive for arthralgias.    Physical Exam Vital Signs  I have reviewed the triage vital signs BP 122/86   Pulse 82   Temp 98.8 F (37.1 C) (Oral)   Resp 18   SpO2 96%   Physical Exam Constitutional:      General: He is not in acute distress.    Appearance: Normal appearance.  HENT:     Head: Normocephalic and atraumatic.     Nose: No congestion or rhinorrhea.  Eyes:     General:        Right eye: No discharge.        Left eye: No discharge.     Extraocular Movements: Extraocular movements intact.     Pupils: Pupils are equal,  round, and reactive to light.  Cardiovascular:     Rate and Rhythm: Normal rate and regular rhythm.     Heart sounds: No murmur heard. Pulmonary:     Effort: No respiratory distress.     Breath sounds: No wheezing or rales.  Abdominal:     General: There is no distension.     Tenderness: There is no abdominal tenderness.  Musculoskeletal:        General: Swelling and tenderness present. Normal range of motion.     Cervical back: Normal range of motion.  Skin:    General: Skin is warm and dry.  Neurological:     General: No focal deficit present.     Mental Status: He is alert.     ED Results and Treatments Labs (all labs ordered are listed, but only abnormal results are displayed) Labs Reviewed  COMPREHENSIVE METABOLIC PANEL - Abnormal; Notable for the following components:      Result Value   Total Protein 6.2 (*)    All other components within normal limits  SALICYLATE LEVEL - Abnormal; Notable for the following components:   Salicylate Lvl <7.0 (*)    All other components within normal limits  ACETAMINOPHEN LEVEL - Abnormal; Notable for the following components:   Acetaminophen (Tylenol), Serum <10 (*)    All other components within normal limits   ETHANOL  CBC  RAPID URINE DRUG SCREEN, HOSP PERFORMED                                                                                                                          Radiology DG Knee Complete 4 Views Left  Result Date: 01/04/2022 CLINICAL DATA:  Leg pain EXAM: LEFT KNEE - COMPLETE 4+ VIEW COMPARISON:  None Available. FINDINGS: No evidence of fracture, dislocation, or joint effusion. No evidence of arthropathy or other focal bone abnormality. Soft tissues are unremarkable. IMPRESSION: Negative. Electronically Signed   By: Jasmine Pang M.D.   On: 01/04/2022 20:09    Pertinent labs & imaging results that were available during my care of the patient were reviewed by me and considered in my medical decision making (see MDM for details).  Medications Ordered in ED Medications  naproxen (NAPROSYN) tablet 500 mg (500 mg Oral Given 01/04/22 1912)  Procedures Procedures  (including critical care time)  Medical Decision Making / ED Course   This patient presents to the ED for concern of knee pain, this involves an extensive number of treatment options, and is a complaint that carries with it a high risk of complications and morbidity.  The differential diagnosis includes arthritis, overuse injury, gout, pseudogout, secondary gain  MDM: Patient seen in the emergency room for evaluation of multiple complaints as described above.  Physical exam with some mild tenderness to the medial aspect of the left knee but is otherwise unremarkable.  X-ray imaging unremarkable.  Laboratory work-up unremarkable.  He received Naprosyn for his pain and when informed that he will be discharged from the ER today, patient became quite upset and requested an assisted living facility as he is currently homeless.  I informed the patient that he does not qualify for assisted living  facility on the sole basis of homelessness and unfortunately the ER cannot provide any additional inpatient resources.  He was given outpatient resources for local shelters and as he does not meet inpatient criteria for admission he was discharged with outpatient follow-up.   Additional history obtained:  -External records from outside source obtained and reviewed including: Chart review including previous notes, labs, imaging, consultation notes   Lab Tests: -I ordered, reviewed, and interpreted labs.   The pertinent results include:   Labs Reviewed  COMPREHENSIVE METABOLIC PANEL - Abnormal; Notable for the following components:      Result Value   Total Protein 6.2 (*)    All other components within normal limits  SALICYLATE LEVEL - Abnormal; Notable for the following components:   Salicylate Lvl <7.0 (*)    All other components within normal limits  ACETAMINOPHEN LEVEL - Abnormal; Notable for the following components:   Acetaminophen (Tylenol), Serum <10 (*)    All other components within normal limits  ETHANOL  CBC  RAPID URINE DRUG SCREEN, HOSP PERFORMED    Imaging Studies ordered: I ordered imaging studies including x-ray knee I independently visualized and interpreted imaging. I agree with the radiologist interpretation   Medicines ordered and prescription drug management: Meds ordered this encounter  Medications   naproxen (NAPROSYN) tablet 500 mg    -I have reviewed the patients home medicines and have made adjustments as needed  Critical interventions none    Cardiac Monitoring: The patient was maintained on a cardiac monitor.  I personally viewed and interpreted the cardiac monitored which showed an underlying rhythm of: NSR  Social Determinants of Health:  Factors impacting patients care include: Homeless   Reevaluation: After the interventions noted above, I reevaluated the patient and found that they have :improved  Co morbidities that complicate  the patient evaluation  Past Medical History:  Diagnosis Date   Homeless       Dispostion: I considered admission for this patient, but he currently does not meet inpatient criteria for admission and will be discharged with outpatient follow-up     Final Clinical Impression(s) / ED Diagnoses Final diagnoses:  Homelessness     @PCDICTATION @    , MD 01/04/22 [phone]

## 2022-01-04 NOTE — ED Triage Notes (Addendum)
Pt BIBA from [facility]. Pt c/o L leg pain. Pt reports leg gave out beneath them. Pt did not fall. No noticeable swelling  Aox4  Pt states they want to "punch themselves in the face till they die"

## 2022-01-07 ENCOUNTER — Ambulatory Visit (HOSPITAL_COMMUNITY)
Admission: EM | Admit: 2022-01-07 | Discharge: 2022-01-07 | Disposition: A | Discharge status: Home / Self Care | Payer: Medicare PPO | Attending: Behavioral Health | Admitting: Behavioral Health

## 2022-01-07 DIAGNOSIS — Z5900 Homelessness unspecified: Secondary | ICD-10-CM | POA: Insufficient documentation

## 2022-01-07 DIAGNOSIS — R4689 Other symptoms and signs involving appearance and behavior: Secondary | ICD-10-CM | POA: Diagnosis not present

## 2022-01-07 DIAGNOSIS — F209 Schizophrenia, unspecified: Secondary | ICD-10-CM | POA: Diagnosis not present

## 2022-01-07 NOTE — ED Provider Notes (Signed)
Behavioral Health Urgent Care Medical Screening Exam  Patient Name: Roberto Osborn MRN: [id number] Date of Evaluation: 01/07/22  Diagnosis:  Final diagnoses:  Homelessness    History of Present illness: Roberto Osborn is a 51 y.o. male patient who presented to the [facility] behavioral health urgent care voluntary accompanied by EMS with a chief complaint of seeking shelter resources and SI with no plan or intent.  Patient seen and evaluated face-to-face by this provider, and chart reviewed. Per chart review, he was recently seen at the [facility] emergency department on 01/04/22 for similar presentation due to homelessness.  On evaluation, patient is alert and oriented x 4. His thought process is logical and relevant. His speech is clear and coherent. He is dysphoric and affect is congruent.  On approach, he asked me if he can take a shower. He states that he was so that he can stay here and that they had a bed available for him. He states that his main problem is housing and that he has been sleeping outside in the grass for the past 2 years. He states that the shelters do not work for him because he has to go outside during the daytime and the beds are too small. He states that he left the shelter a couple days ago and lost his bed. He states that he does not have any family in the area and that his brother is dead. He states that all he needs is a pillow and a bed.   I discussed with the patient community resources for shelters and local rescue missions. I also discussed with the patient that there is a men's rescue mission in [place] called [employer] and a rescue mission in [place]. He states that he does not like it at the [facility] and does not want to go there. He states that he would like to go to [facility]. He was advised that he would have to call the facility himself to complete the process and inquire about bed availability. He verbalizes understanding.  He states that he does not have a cell phone. He was advised that he could used the phone here at the [facility].   Patient denies suicidal ideations at this time. He denies homicidal ideations. He denies auditory and visual hallucinations. There is no objective evidence that the patient is currently responding to internal or external stimuli. He denies access to weapons, including guns. He denies taking medications for mental health.  Plan: Patient to be discharged with recommendations to follow up with outpatient psychiatry for mental health. Patient provided with community resources for shelters and rescue missions.    Psychiatric Specialty Exam  Presentation  General Appearance:Appropriate for Environment  Eye Contact:Fair  Speech:Clear and Coherent  Speech Volume:Normal  Handedness:Right   Mood and Affect  Mood:Dysphoric  Affect:Congruent   Thought Process  Thought Processes:Coherent  Descriptions of Associations:Intact  Orientation:Full (Time, Place and Person)  Thought Content:Logical  Diagnosis of Schizophrenia or Schizoaffective disorder in past: Yes  Duration of Psychotic Symptoms: Greater than six months  Hallucinations:None Patient only reported hearing things Patient only reported seeing things  Ideas of Reference:None  Suicidal Thoughts:No With Intent; Without Plan Without Plan  Homicidal Thoughts:No   Sensorium  Memory:Immediate Fair; Recent Fair; Remote Fair  Judgment:Intact  Insight:Present   Executive Functions  Concentration:Fair  Attention Span:Fair  Recall:Fair  Fund of Knowledge:Fair  Language:Fair   Psychomotor Activity  Psychomotor Activity:Normal   Assets  Assets:Communication Skills; Desire  for Improvement; Leisure Time; Physical Health   Sleep  Sleep:Poor   Physical Exam: Physical Exam HENT:     Head: Normocephalic.     Nose: Nose normal.  Eyes:     Conjunctiva/sclera: Conjunctivae normal.   Cardiovascular:     Rate and Rhythm: Normal rate.  Pulmonary:     Effort: Pulmonary effort is normal.  Musculoskeletal:        General: Normal range of motion.     Cervical back: Normal range of motion.  Neurological:     Mental Status: He is alert and oriented to person, place, and time.    Review of Systems  Constitutional: Negative.   HENT: Negative.    Eyes: Negative.   Respiratory: Negative.    Cardiovascular: Negative.   Gastrointestinal: Negative.   Neurological: Negative.   Endo/Heme/Allergies: Negative.    Blood pressure 109/78, pulse 98, resp. rate 18, SpO2 100 %. There is no height or weight on file to calculate BMI.  Musculoskeletal: Strength & Muscle Tone: within normal limits Gait & Station: normal Patient leans: N/A   [facility] MSE Discharge Disposition for Follow up and Recommendations: Based on my evaluation the patient does not appear to have an emergency medical condition and can be discharged with resources and follow up care in outpatient services for Individual Therapy  Please see the list of [facility] for local shelters and rescue missions in the area.   You may also contact [facility] in [place] for bed availability for their mens outreach program at [phone].   You may contact the [facility]) / Resources for the Homeless for community support/shelter options at [phone] [address] [place], [place]  Parent/guardian should update outpatient providers of any new medications and/or medication changes.   Safety:  The patient should abstain from use of illicit substances/drugs and abuse of any medications. If symptoms worsen or do not continue to improve or if the patient becomes actively suicidal or homicidal then it is recommended that the patient return to the closest hospital emergency department, the [facility], or call 911 for further  evaluation and treatment. National Suicide Prevention Lifeline 1-800-SUICIDE or (787)700-8682.  About 988 988 offers 24/7 access to trained crisis counselors who can help people experiencing mental health-related distress. People can call or text 988 or chat 988lifeline.org for themselves or if they are worried about a loved one who may need crisis support.    Follow-up Information     [facility]   Specialty: Urgent Care Why: Walk-in hours for open access for psychiatry are Mondays, Wednesdays and Fridays 8 am to 11 pm. Please arrive at 7:30 am. Open access for therapy are Mondays, Tuesdays, and Wednesdays from 8:00 am to 11:00 pm. Please arrive at 7:30 am Contact information: [address] [place] [address] [phone]                Layla Barter, NP 01/07/2022, 3:59 PM

## 2022-01-07 NOTE — Discharge Instructions (Addendum)
Please see the list of [facility] for local shelters and rescue missions in the area.   You may also contact [facility] in [place] for bed availability for their mens outreach program at [phone].   You may contact the [facility]) / Resources for the Homeless for community support/shelter options at [phone] [address] [place], [place]  Parent/guardian should update outpatient providers of any new medications and/or medication changes.   Safety:  The patient should abstain from use of illicit substances/drugs and abuse of any medications. If symptoms worsen or do not continue to improve or if the patient becomes actively suicidal or homicidal then it is recommended that the patient return to the closest hospital emergency department, the [facility], or call 911 for further evaluation and treatment. National Suicide Prevention Lifeline 1-800-SUICIDE or (343)804-6255.  About 988 988 offers 24/7 access to trained crisis counselors who can help people experiencing mental health-related distress. People can call or text 988 or chat 988lifeline.org for themselves or if they are worried about a loved one who may need crisis support.

## 2022-01-21 ENCOUNTER — Ambulatory Visit (INDEPENDENT_AMBULATORY_CARE_PROVIDER_SITE_OTHER)
Admission: EM | Admit: 2022-01-21 | Discharge: 2022-01-22 | Disposition: A | Discharge status: Other Acute Inpatient Hospital | Payer: Medicare PPO | Source: Home / Self Care

## 2022-01-21 ENCOUNTER — Other Ambulatory Visit: Payer: Self-pay

## 2022-01-21 ENCOUNTER — Encounter (HOSPITAL_COMMUNITY): Payer: Self-pay

## 2022-01-21 ENCOUNTER — Emergency Department (HOSPITAL_COMMUNITY)
Admission: EM | Admit: 2022-01-21 | Discharge: 2022-01-21 | Disposition: A | Discharge status: Home / Self Care | Payer: Medicare PPO | Attending: Emergency Medicine | Admitting: Emergency Medicine

## 2022-01-21 DIAGNOSIS — R000 Tachycardia, unspecified: Secondary | ICD-10-CM | POA: Insufficient documentation

## 2022-01-21 DIAGNOSIS — M25562 Pain in left knee: Secondary | ICD-10-CM | POA: Diagnosis not present

## 2022-01-21 DIAGNOSIS — Z5900 Homelessness unspecified: Secondary | ICD-10-CM | POA: Diagnosis not present

## 2022-01-21 DIAGNOSIS — S80919A Unspecified superficial injury of unspecified knee, initial encounter: Secondary | ICD-10-CM | POA: Diagnosis not present

## 2022-01-21 DIAGNOSIS — F333 Major depressive disorder, recurrent, severe with psychotic symptoms: Secondary | ICD-10-CM | POA: Insufficient documentation

## 2022-01-21 DIAGNOSIS — Z20822 Contact with and (suspected) exposure to covid-19: Secondary | ICD-10-CM | POA: Diagnosis not present

## 2022-01-21 DIAGNOSIS — Z046 Encounter for general psychiatric examination, requested by authority: Secondary | ICD-10-CM | POA: Insufficient documentation

## 2022-01-21 DIAGNOSIS — R259 Unspecified abnormal involuntary movements: Secondary | ICD-10-CM | POA: Diagnosis not present

## 2022-01-21 DIAGNOSIS — F29 Unspecified psychosis not due to a substance or known physiological condition: Secondary | ICD-10-CM | POA: Diagnosis not present

## 2022-01-21 DIAGNOSIS — W19XXXA Unspecified fall, initial encounter: Secondary | ICD-10-CM | POA: Diagnosis not present

## 2022-01-21 DIAGNOSIS — R45851 Suicidal ideations: Secondary | ICD-10-CM | POA: Diagnosis not present

## 2022-01-21 DIAGNOSIS — R9431 Abnormal electrocardiogram [ECG] [EKG]: Secondary | ICD-10-CM | POA: Diagnosis not present

## 2022-01-21 LAB — POC SARS CORONAVIRUS 2 AG: SARSCOV2ONAVIRUS 2 AG: NEGATIVE

## 2022-01-21 MED ORDER — TRAZODONE HCL 50 MG PO TABS: 50.0000 mg | ORAL_TABLET | Freq: Every evening | ORAL | Status: DC | PRN

## 2022-01-21 MED ORDER — MAGNESIUM HYDROXIDE 400 MG/5ML PO SUSP: 30.0000 mL | Freq: Every day | ORAL | Status: DC | PRN

## 2022-01-21 MED ORDER — ALUM & MAG HYDROXIDE-SIMETH 200-200-20 MG/5ML PO SUSP: 30.0000 mL | ORAL | Status: DC | PRN

## 2022-01-21 MED ORDER — IBUPROFEN 400 MG PO TABS
600.0000 mg | ORAL_TABLET | Freq: Once | ORAL | Status: AC
  Administered 2022-01-21: 600 mg via ORAL
  Filled 2022-01-21: qty 1

## 2022-01-21 MED ORDER — ACETAMINOPHEN 325 MG PO TABS: 650.0000 mg | ORAL_TABLET | Freq: Four times a day (QID) | ORAL | Status: DC | PRN

## 2022-01-21 MED ORDER — LORAZEPAM 2 MG/ML IJ SOLN
1.0000 mg | Freq: Once | INTRAMUSCULAR | Status: AC
  Administered 2022-01-21: 1 mg via INTRAMUSCULAR
  Filled 2022-01-21: qty 1

## 2022-01-21 MED ORDER — HYDROXYZINE HCL 25 MG PO TABS: 25.0000 mg | ORAL_TABLET | Freq: Three times a day (TID) | ORAL | Status: DC | PRN

## 2022-01-21 MED ORDER — ZIPRASIDONE MESYLATE 20 MG IM SOLR
20.0000 mg | Freq: Two times a day (BID) | INTRAMUSCULAR | Status: DC | PRN
  Administered 2022-01-21 – 2022-01-22 (×2): 20 mg via INTRAMUSCULAR
  Filled 2022-01-21 (×2): qty 20

## 2022-01-21 NOTE — ED Triage Notes (Signed)
Pt here for L knee pain ?? Pt was found laying on the grass by bystander.pt is homeless. Pt had been seen for same multiple times.  Pt was making inappropriate  sexual comments w EMS. Hx of schizophrenia.

## 2022-01-21 NOTE — ED Notes (Signed)
Pt refused Bloodwork,  COVID obtained & sent to lab.

## 2022-01-21 NOTE — ED Notes (Signed)
Pt A&O x 4, presents with agitation, cursing black [facility] staff. And suicidal  ideations, plan to cut genitals off. And punching self in the face.  Pt leval by NP Ene Ajibola. Agitation meds ordered and given without incident.  Pt tolerated well.  Monitoring for safety.

## 2022-01-21 NOTE — ED Provider Notes (Signed)
[facility] EMERGENCY DEPARTMENT Provider Note   CSN: [id number] Arrival date & time: 01/21/22  1056     History  Chief Complaint  Patient presents with   Knee Pain    Roberto Osborn is a 51 y.o. male with medical history of homelessness.  The patient presents to the ED for evaluation of suspected left knee pain.  This is the patient's 14th visit in the last 6 months.  This is the patient's second visit for left knee pain in the last 1 month. According to triage note, the patient was found lying down in the grass by a bystander.  The patient was transported to the ED by EMS and was making inappropriate sexual comments throughout the duration of the transport.  The patient has been seen in this ED multiple times in the last 1 month for the same complaint of homelessness, left knee pain.  Patient reports remote history of knee surgery 40 years ago.  The patient denies any recent trauma to the knee, event to account for the pain.  The patient is demanding pain medication, stating that "just give me something strong and a sandwich and I'll leave".  Patient is also demanding a sandwich.  Throughout the entire course of my interview with the patient, the patient will intermittently put his hand down his pants and touch his genitals.  Patient denies any SI, HI, AVH.  Patient denies any chest pain, shortness of breath, fevers, calf pain.   Knee Pain Associated symptoms: no fever        Home Medications Prior to Admission medications   Medication Sig Start Date End Date Taking? Authorizing Provider  citalopram (CELEXA) 10 MG tablet Take 1 tablet (10 mg total) by mouth daily. 08/28/21 09/27/21  Regan Lemming, MD  risperiDONE (RISPERDAL) 1 MG tablet Take 1 tablet (1 mg total) by mouth at bedtime. 08/28/21 09/27/21  Regan Lemming, MD      Allergies    Risperdal [risperidone]    Review of Systems   Review of Systems  Constitutional:  Negative for fever.  Respiratory:   Negative for shortness of breath.   Cardiovascular:  Negative for chest pain.  Musculoskeletal:  Positive for arthralgias.  All other systems reviewed and are negative.   Physical Exam Updated Vital Signs BP (!) 143/100 (BP Location: Left Arm)   Pulse (!) 104   Temp 97.7 F (36.5 C) (Oral)   Resp 18   SpO2 100%  Physical Exam Vitals and nursing note reviewed.  Constitutional:      General: He is not in acute distress.    Appearance: Normal appearance. He is not ill-appearing, toxic-appearing or diaphoretic.  HENT:     Head: Normocephalic and atraumatic.     Nose: Nose normal. No congestion.     Mouth/Throat:     Mouth: Mucous membranes are moist.     Pharynx: Oropharynx is clear.  Eyes:     Extraocular Movements: Extraocular movements intact.     Conjunctiva/sclera: Conjunctivae normal.     Pupils: Pupils are equal, round, and reactive to light.  Cardiovascular:     Rate and Rhythm: Regular rhythm. Tachycardia present.  Pulmonary:     Effort: Pulmonary effort is normal.     Breath sounds: Normal breath sounds. No wheezing.  Abdominal:     General: Abdomen is flat. Bowel sounds are normal.     Palpations: Abdomen is soft.     Tenderness: There is no abdominal tenderness.  Musculoskeletal:  Cervical back: Normal range of motion and neck supple.     Left knee: No swelling, deformity, effusion or erythema. Normal range of motion. Tenderness present.     Comments: Patient left knee palpated without any findings of crepitus.  There is no overlying skin change.  The patient has full range of motion to his knee.  Patient does indicate slight tenderness to the medial side of his knee.  Skin:    General: Skin is warm and dry.     Capillary Refill: Capillary refill takes less than 2 seconds.  Neurological:     Mental Status: He is alert and oriented to person, place, and time.     ED Results / Procedures / Treatments   Labs (all labs ordered are listed, but only abnormal  results are displayed) Labs Reviewed - No data to display  EKG None  Radiology No results found.  Procedures Procedures   Medications Ordered in ED Medications  ibuprofen (ADVIL) tablet 600 mg (has no administration in time range)    ED Course/ Medical Decision Making/ A&P                           Medical Decision Making  51 year old male presents to the ED for evaluation of left knee pain.  The patient denies any recent trauma, event to account for this increased pain.  Due to this, we will not proceed with any imaging of the patient's left knee.  The patient recently had imaging of his left knee on 9/5 which was unremarkable.  Please see HPI for further details.  On examination patient afebrile.  Patient lung sounds clear bilaterally, not hypoxic.  The patient does not have any overlying skin change to his left knee.  The patient has full range of motion of his left knee. Multiple times throughout patient's stay, the patient was seen ambulating around his hospital bed in the hallway without issue.  The patient was provided with 600 mg ibuprofen and a sandwich.  Patient given return precautions and he voiced understanding.  Patient all his questions answered his satisfaction prior to discharge.  Patient discharged.   Final Clinical Impression(s) / ED Diagnoses Final diagnoses:  Left knee pain, unspecified chronicity    Rx / DC Orders ED Discharge Orders     None         Clent Ridges 01/21/22 1204    Mardene Sayer, MD 01/21/22 1859

## 2022-01-21 NOTE — Discharge Instructions (Addendum)
Please return to the ED with any new symptoms such as fevers or redness to your knee Please follow-up with your primary care doctor Please continue taking ibuprofen for pain control Please read attached guide concerning acute knee pain

## 2022-01-21 NOTE — ED Triage Notes (Signed)
Pt presents to [facility] voluntarily, accompanied by [employer] at this time. Pt entered the building agitated and yelling, cursing and being disrespectful to registration staff. Pt did allow this writer to get vitals. Per [employer], pt was wandering on the streets and he was punching himself in the face. Pt was at [facility] ED earlier today. Pt refused to complete triage process at this time. Per [employer], IVC paperwork has been initiated.

## 2022-01-22 ENCOUNTER — Emergency Department (EMERGENCY_DEPARTMENT_HOSPITAL)
Admission: EM | Admit: 2022-01-22 | Discharge: 2022-01-26 | Disposition: A | Discharge status: Home / Self Care | Payer: Medicare PPO | Source: Home / Self Care | Attending: Emergency Medicine | Admitting: Emergency Medicine

## 2022-01-22 ENCOUNTER — Encounter (HOSPITAL_COMMUNITY): Payer: Self-pay

## 2022-01-22 DIAGNOSIS — Z5900 Homelessness unspecified: Secondary | ICD-10-CM

## 2022-01-22 DIAGNOSIS — F333 Major depressive disorder, recurrent, severe with psychotic symptoms: Secondary | ICD-10-CM

## 2022-01-22 DIAGNOSIS — R259 Unspecified abnormal involuntary movements: Secondary | ICD-10-CM | POA: Diagnosis not present

## 2022-01-22 DIAGNOSIS — R45851 Suicidal ideations: Secondary | ICD-10-CM | POA: Diagnosis not present

## 2022-01-22 DIAGNOSIS — Z046 Encounter for general psychiatric examination, requested by authority: Secondary | ICD-10-CM

## 2022-01-22 DIAGNOSIS — R9431 Abnormal electrocardiogram [ECG] [EKG]: Secondary | ICD-10-CM | POA: Diagnosis not present

## 2022-01-22 DIAGNOSIS — F29 Unspecified psychosis not due to a substance or known physiological condition: Secondary | ICD-10-CM | POA: Diagnosis present

## 2022-01-22 DIAGNOSIS — M25562 Pain in left knee: Secondary | ICD-10-CM | POA: Diagnosis not present

## 2022-01-22 LAB — CBC
HCT: 46.1 % (ref 39.0–52.0)
Hemoglobin: 14.8 g/dL (ref 13.0–17.0)
MCH: 29.6 pg (ref 26.0–34.0)
MCHC: 32.1 g/dL (ref 30.0–36.0)
MCV: 92.2 fL (ref 80.0–100.0)
Platelets: 200 10*3/uL (ref 150–400)
RBC: 5 MIL/uL (ref 4.22–5.81)
RDW: 13.6 % (ref 11.5–15.5)
WBC: 7.8 10*3/uL (ref 4.0–10.5)
nRBC: 0 % (ref 0.0–0.2)

## 2022-01-22 LAB — COMPREHENSIVE METABOLIC PANEL
ALT: 16 U/L (ref 0–44)
AST: 23 U/L (ref 15–41)
Albumin: 4.1 g/dL (ref 3.5–5.0)
Alkaline Phosphatase: 72 U/L (ref 38–126)
Anion gap: 6 (ref 5–15)
BUN: 13 mg/dL (ref 6–20)
CO2: 29 mmol/L (ref 22–32)
Calcium: 9.8 mg/dL (ref 8.9–10.3)
Chloride: 107 mmol/L (ref 98–111)
Creatinine, Ser: 0.93 mg/dL (ref 0.61–1.24)
GFR, Estimated: >60 mL/min (ref >60)
Glucose, Bld: 90 mg/dL (ref 70–99)
Potassium: 4.1 mmol/L (ref 3.5–5.1)
Sodium: 142 mmol/L (ref 135–145)
Total Bilirubin: 0.8 mg/dL (ref 0.3–1.2)
Total Protein: 6.5 g/dL (ref 6.5–8.1)

## 2022-01-22 LAB — RESP PANEL BY RT-PCR (FLU A&B, COVID) ARPGX2
Influenza A by PCR: NEGATIVE
Influenza B by PCR: NEGATIVE
SARS Coronavirus 2 by RT PCR: NEGATIVE

## 2022-01-22 LAB — ETHANOL: Alcohol, Ethyl (B): <10 mg/dL (ref <10)

## 2022-01-22 MED ORDER — CITALOPRAM HYDROBROMIDE 10 MG PO TABS
10.0000 mg | ORAL_TABLET | Freq: Every day | ORAL | Status: DC
  Administered 2022-01-22: 10 mg via ORAL
  Filled 2022-01-22: qty 1

## 2022-01-22 MED ORDER — ACETAMINOPHEN 325 MG PO TABS
650.0000 mg | ORAL_TABLET | ORAL | Status: DC | PRN
  Administered 2022-01-23 – 2022-01-25 (×3): 650 mg via ORAL
  Filled 2022-01-22 (×3): qty 2

## 2022-01-22 MED ORDER — LORAZEPAM 1 MG PO TABS: 1.0000 mg | ORAL_TABLET | Freq: Once | ORAL | Status: AC | PRN

## 2022-01-22 MED ORDER — LORAZEPAM 1 MG PO TABS
1.0000 mg | ORAL_TABLET | Freq: Once | ORAL | Status: DC
Start: 2022-01-22 — End: 2022-01-22

## 2022-01-22 MED ORDER — DIPHENHYDRAMINE HCL 50 MG/ML IJ SOLN
50.0000 mg | Freq: Once | INTRAMUSCULAR | Status: AC
  Administered 2022-01-22: 50 mg via INTRAMUSCULAR
  Filled 2022-01-22: qty 1

## 2022-01-22 MED ORDER — LORAZEPAM 2 MG/ML IJ SOLN
1.0000 mg | Freq: Once | INTRAMUSCULAR | Status: AC | PRN
  Administered 2022-01-22: 1 mg via INTRAMUSCULAR
  Filled 2022-01-22: qty 1

## 2022-01-22 MED ORDER — OLANZAPINE 5 MG PO TBDP: 5.0000 mg | ORAL_TABLET | Freq: Every day | ORAL | Status: DC

## 2022-01-22 MED ORDER — LORAZEPAM 2 MG/ML IJ SOLN
1.0000 mg | Freq: Once | INTRAMUSCULAR | Status: AC
  Administered 2022-01-22: 1 mg via INTRAMUSCULAR
  Filled 2022-01-22: qty 1

## 2022-01-22 MED ORDER — HALOPERIDOL LACTATE 5 MG/ML IJ SOLN
5.0000 mg | Freq: Once | INTRAMUSCULAR | Status: AC
  Administered 2022-01-22: 5 mg via INTRAMUSCULAR
  Filled 2022-01-22: qty 1

## 2022-01-22 NOTE — Progress Notes (Signed)
Per Ricky Ala, NP, via secure chat, patient meets criteria for inpatient treatment. There are no available beds at [facility] today. CSW faxed referrals to the following facilities for review:  [facility]  Pending - No Request Sent N/A [address], [place] [address] [phone] --  [place] [address] [place] [address] [phone] --  [facility]  Pending - No Request Sent N/A [place], [place] [address] [phone] --  [facility]  Pending - No Request Sent N/A [address] [place], [place] [address] [phone] --  [facility]  Pending - No Request Sent N/A [address] [place] [address] [phone] --  [facility]  Pending - No Request Sent N/A [address] [place] [address] [phone] --  [facility]  Pending - No Request Sent N/A [place] [place] [address] [phone] --  [place]  Pending - No Request Sent N/A [address] [place] [address] [phone] --  [facility]  Pending - No Request Sent N/A [address] [place] [address] [phone] --  [place]  Pending - No Request Sent N/A [address] [place] [address] [phone] --  [facility]  Pending - No Request Sent N/A [address], [place] [address] [phone] --  [facility]  Pending - No Request Sent N/A [place], [place] [address] [phone] --  [facility]  Pending - No Request Sent N/A [address] [place] [address] [place] [address] [phone] --  [facility]  Pending - No Request Sent N/A [address] [place] [address] [facility]  Pending - No Request Sent N/A [address] [place] [address] [phone] --  [facility]  Pending - No Request [facility] [address] [place] [address] [phone] --   TTS will continue to seek bed placement.  Glennie Isle, MSW, [place] Phone: [phone] Disposition/TOC

## 2022-01-22 NOTE — ED Provider Notes (Signed)
Behavioral Health Progress Note  Date and Time: 01/22/2022 12:03 PM Name: Roberto Osborn MRN:  [id number]  Subjective:  Roberto Osborn is seen and evaluated face-to-face by this provider.  Multiple attempts at reassessment throughout the day.  Patient presented under involuntary commitment due to "bizarre behavior."  It was reported that patient has been combative, aggressive and screaming throughout the unit..  Agitation protocol placed for Geodon and Ativan earlier this evening.  During this assessment patient endorsed auditory and visual hallucinations.  States he is suicidal denied plan or intent.  Initiated Zyprexa 5 mg nightly for mood stabilization.  Patient to be faxed out for inpatient admission.  Will attempt to reassess patient on 01/23/2022 when more cooperative.  Per initial admission assessment note: "Roberto Osborn is a 51 year old male with psychiatric history of depression, anxiety and homelessness.  Patient was brought to [facility] by law enforcement for mental health evaluation. On presentation, patient is noted to be very agitated.  he is yelling and cursing at staffs, and using racial slurs towards African-American staffs. He is uncooperative with assessment and nonredirectable with verbal prompts. Patient was given Ziprasidone 20mg  IM and Ativan 1 mg IM for agitation after he became physically aggressive and throwing food at staff."    Diagnosis:  Final diagnoses:  Severe episode of recurrent major depressive disorder, with psychotic features ([place])  Homelessness    Total Time spent with patient: 15 minutes  Past Psychiatric History:  Past Medical History:  Past Medical History:  Diagnosis Date   Homeless    No past surgical history on file. Family History: No family history on file. Family Psychiatric  History:  Social History:  Social History   Substance and Sexual Activity  Alcohol Use Yes   Comment: occasionally     Social History   Substance and Sexual  Activity  Drug Use Not Currently   Types: Marijuana    Social History   Socioeconomic History   Marital status: Single    Spouse name: Not on file   Number of children: Not on file   Years of education: Not on file   Highest education level: Not on file  Occupational History   Not on file  Tobacco Use   Smoking status: Every Day    Types: Cigarettes, Cigars   Smokeless tobacco: Not on file  Vaping Use   Vaping Use: Never used  Substance and Sexual Activity   Alcohol use: Yes    Comment: occasionally   Drug use: Not Currently    Types: Marijuana   Sexual activity: Not on file  Other Topics Concern   Not on file  Social History Narrative   Not on file   Social Determinants of Health   Financial Resource Strain: Not on file  Food Insecurity: Not on file  Transportation Needs: Not on file  Physical Activity: Not on file  Stress: Not on file  Social Connections: Not on file   SDOH:  SDOH Screenings   Depression (PHQ2-9): Low Risk  (08/31/2021)  Tobacco Use: High Risk (01/21/2022)   Additional Social History:    Pain Medications: See MAR Prescriptions: See MAR Over the Counter: See MAR History of alcohol / drug use?: No history of alcohol / drug abuse (Pt denies.)                    Sleep: Fair  Appetite:  Fair  Current Medications:  Current Facility-Administered Medications  Medication Dose Route Frequency Provider Last Rate  Last Admin   acetaminophen (TYLENOL) tablet 650 mg  650 mg Oral Q6H PRN Ajibola, Ene A, NP       alum & mag hydroxide-simeth (MAALOX/MYLANTA) 200-200-20 MG/5ML suspension 30 mL  30 mL Oral Q4H PRN Ajibola, Ene A, NP       citalopram (CELEXA) tablet 10 mg  10 mg Oral Daily Ajibola, Ene A, NP   10 mg at 01/22/22 [id number]   hydrOXYzine (ATARAX) tablet 25 mg  25 mg Oral TID PRN Ajibola, Ene A, NP       LORazepam (ATIVAN) tablet 1 mg  1 mg Oral Once Derrill Center, NP       magnesium hydroxide (MILK OF MAGNESIA) suspension 30 mL  30 mL  Oral Daily PRN Ajibola, Ene A, NP       traZODone (DESYREL) tablet 50 mg  50 mg Oral QHS PRN Ajibola, Ene A, NP       ziprasidone (GEODON) injection 20 mg  20 mg Intramuscular Q12H PRN Ajibola, Ene A, NP   20 mg at 01/21/22 2232   Current Outpatient Medications  Medication Sig Dispense Refill   citalopram (CELEXA) 10 MG tablet Take 1 tablet (10 mg total) by mouth daily. 30 tablet 0   risperiDONE (RISPERDAL) 1 MG tablet Take 1 tablet (1 mg total) by mouth at bedtime. 30 tablet 0    Labs  Lab Results:  Admission on 01/21/2022  Component Date Value Ref Range Status   SARS Coronavirus 2 by RT PCR 01/21/2022 NEGATIVE  NEGATIVE Final   Comment: (NOTE) SARS-CoV-2 target nucleic acids are NOT DETECTED.  The SARS-CoV-2 RNA is generally detectable in upper respiratory specimens during the acute phase of infection. The lowest concentration of SARS-CoV-2 viral copies this assay can detect is 138 copies/mL. A negative result does not preclude SARS-Cov-2 infection and should not be used as the sole basis for treatment or other patient management decisions. A negative result may occur with  improper specimen collection/handling, submission of specimen other than nasopharyngeal swab, presence of viral mutation(s) within the areas targeted by this assay, and inadequate number of viral copies(<138 copies/mL). A negative result must be combined with clinical observations, patient history, and epidemiological information. The expected result is Negative.  Fact Sheet for Patients:  EntrepreneurPulse.com.au  Fact Sheet for Healthcare Providers:  IncredibleEmployment.be  This test is no                          t yet approved or cleared by the [place] FDA and  has been authorized for detection and/or diagnosis of SARS-CoV-2 by FDA under an Emergency Use Authorization (EUA). This EUA will remain  in effect (meaning this test can be used) for the duration of  the COVID-19 declaration under Section 564(b)(1) of the Act, 21 U.S.C.section 360bbb-3(b)(1), unless the authorization is terminated  or revoked sooner.       Influenza A by PCR 01/21/2022 NEGATIVE  NEGATIVE Final   Influenza B by PCR 01/21/2022 NEGATIVE  NEGATIVE Final   Comment: (NOTE) The Xpert Xpress SARS-CoV-2/FLU/RSV plus assay is intended as an aid in the diagnosis of influenza from Nasopharyngeal swab specimens and should not be used as a sole basis for treatment. Nasal washings and aspirates are unacceptable for Xpert Xpress SARS-CoV-2/FLU/RSV testing.  Fact Sheet for Patients: EntrepreneurPulse.com.au  Fact Sheet for Healthcare Providers: IncredibleEmployment.be  This test is not yet approved or cleared by the [place] and has been authorized for  detection and/or diagnosis of SARS-CoV-2 by FDA under an Emergency Use Authorization (EUA). This EUA will remain in effect (meaning this test can be used) for the duration of the COVID-19 declaration under Section 564(b)(1) of the Act, 21 U.S.C. section 360bbb-3(b)(1), unless the authorization is terminated or revoked.  Performed at [facility], [place] [address] [place], [place] [address]    SARSCOV2ONAVIRUS 2 AG 01/21/2022 NEGATIVE  NEGATIVE Final   Comment: (NOTE) SARS-CoV-2 antigen NOT DETECTED.   Negative results are presumptive.  Negative results do not preclude SARS-CoV-2 infection and should not be used as the sole basis for treatment or other patient management decisions, including infection  control decisions, particularly in the presence of clinical signs and  symptoms consistent with COVID-19, or in those who have been in contact with the virus.  Negative results must be combined with clinical observations, patient history, and epidemiological information. The expected result is Negative.  Fact Sheet for Patients:  HandmadeRecipes.com.cy  Fact Sheet for Healthcare Providers: FuneralLife.at  This test is not yet approved or cleared by the [place] FDA and  has been authorized for detection and/or diagnosis of SARS-CoV-2 by FDA under an Emergency Use Authorization (EUA).  This EUA will remain in effect (meaning this test can be used) for the duration of  the COV                          [place]-19 declaration under Section 564(b)(1) of the Act, 21 U.S.C. section 360bbb-3(b)(1), unless the authorization is terminated or revoked sooner.    Admission on 01/04/2022, Discharged on 01/04/2022  Component Date Value Ref Range Status   Sodium 01/04/2022 141  135 - 145 mmol/L Final   Potassium 01/04/2022 3.8  3.5 - 5.1 mmol/L Final   Chloride 01/04/2022 107  98 - 111 mmol/L Final   CO2 01/04/2022 28  22 - 32 mmol/L Final   Glucose, Bld 01/04/2022 75  70 - 99 mg/dL Final   Glucose reference range applies only to samples taken after fasting for at least 8 hours.   BUN 01/04/2022 15  6 - 20 mg/dL Final   Creatinine, Ser 01/04/2022 0.77  0.61 - 1.24 mg/dL Final   Calcium 01/04/2022 9.0  8.9 - 10.3 mg/dL Final   Total Protein 01/04/2022 6.2 (L)  6.5 - 8.1 g/dL Final   Albumin 01/04/2022 3.8  3.5 - 5.0 g/dL Final   AST 01/04/2022 15  15 - 41 U/L Final   ALT 01/04/2022 13  0 - 44 U/L Final   Alkaline Phosphatase 01/04/2022 58  38 - 126 U/L Final   Total Bilirubin 01/04/2022 0.5  0.3 - 1.2 mg/dL Final   GFR, Estimated 01/04/2022 >60  >60 mL/min Final   Comment: (NOTE) Calculated using the CKD-EPI Creatinine Equation (2021)    Anion gap 01/04/2022 6  5 - 15 Final   Performed at [facility], [place] [address] [place], [place] [address]   Alcohol, Ethyl (B) 01/04/2022 <10  <10 mg/dL Final   Comment: (NOTE) Lowest detectable limit for serum alcohol is 10 mg/dL.  For medical purposes only. Performed at [facility], [place]  [address] [place], [place] [id number]    Salicylate Lvl [id number] <7.0 (L)  7.0 - 30.0 mg/dL Final   Performed at [place] [address] [place], [place] [address]   Acetaminophen (Tylenol), Serum 01/04/2022 <10 (L)  10 - 30 ug/mL Final  Comment: (NOTE) Therapeutic concentrations vary significantly. A range of 10-30 ug/mL  may be an effective concentration for many patients. However, some  are best treated at concentrations outside of this range. Acetaminophen concentrations >150 ug/mL at 4 hours after ingestion  and >50 ug/mL at 12 hours after ingestion are often associated with  toxic reactions.  Performed at [facility], [place] [address], [place], [place] [address]    WBC 01/04/2022 6.2  4.0 - 10.5 K/uL Final   RBC 01/04/2022 4.35  4.22 - 5.81 MIL/uL Final   Hemoglobin 01/04/2022 13.0  13.0 - 17.0 g/dL Final   HCT 01/04/2022 40.0  39.0 - 52.0 % Final   MCV 01/04/2022 92.0  80.0 - 100.0 fL Final   MCH 01/04/2022 29.9  26.0 - 34.0 pg Final   MCHC 01/04/2022 32.5  30.0 - 36.0 g/dL Final   RDW 01/04/2022 13.8  11.5 - 15.5 % Final   Platelets 01/04/2022 204  150 - 400 K/uL Final   nRBC 01/04/2022 0.0  0.0 - 0.2 % Final   Performed at [facility], [place] [address], [place], [place] [address]   Opiates 01/04/2022 NONE DETECTED  NONE DETECTED Final   Cocaine 01/04/2022 NONE DETECTED  NONE DETECTED Final   Benzodiazepines 01/04/2022 NONE DETECTED  NONE DETECTED Final   Amphetamines 01/04/2022 NONE DETECTED  NONE DETECTED Final   Tetrahydrocannabinol 01/04/2022 NONE DETECTED  NONE DETECTED Final   Barbiturates 01/04/2022 NONE DETECTED  NONE DETECTED Final   Comment: (NOTE) DRUG SCREEN FOR MEDICAL PURPOSES ONLY.  IF CONFIRMATION IS NEEDED FOR ANY PURPOSE, NOTIFY LAB WITHIN 5 DAYS.  LOWEST DETECTABLE LIMITS FOR URINE DRUG SCREEN Drug Class                     Cutoff (ng/mL) Amphetamine and metabolites    1000 Barbiturate  and metabolites    200 Benzodiazepine                 [id number] Tricyclics and metabolites     300 Opiates and metabolites        300 Cocaine and metabolites        300 THC                            50 Performed at [facility], [place] [address] [place], [place] [address]   Admission on 12/29/2021, Discharged on 12/30/2021  Component Date Value Ref Range Status   WBC 12/29/2021 8.6  4.0 - 10.5 K/uL Final   RBC 12/29/2021 4.09 (L)  4.22 - 5.81 MIL/uL Final   Hemoglobin 12/29/2021 12.2 (L)  13.0 - 17.0 g/dL Final   HCT 12/29/2021 38.0 (L)  39.0 - 52.0 % Final   MCV 12/29/2021 92.9  80.0 - 100.0 fL Final   MCH 12/29/2021 29.8  26.0 - 34.0 pg Final   MCHC 12/29/2021 32.1  30.0 - 36.0 g/dL Final   RDW 12/29/2021 14.5  11.5 - 15.5 % Final   Platelets 12/29/2021 208  150 - 400 K/uL Final   nRBC 12/29/2021 0.0  0.0 - 0.2 % Final   Neutrophils Relative % 12/29/2021 68  % Final   Neutro Abs 12/29/2021 5.8  1.7 - 7.7 K/uL Final   Lymphocytes Relative 12/29/2021 22  % Final   Lymphs Abs 12/29/2021 1.9  0.7 - 4.0 K/uL Final   Monocytes Relative 12/29/2021 7  % Final   Monocytes  Absolute 12/29/2021 0.6  0.1 - 1.0 K/uL Final   Eosinophils Relative 12/29/2021 2  % Final   Eosinophils Absolute 12/29/2021 0.2  0.0 - 0.5 K/uL Final   Basophils Relative 12/29/2021 1  % Final   Basophils Absolute 12/29/2021 0.0  0.0 - 0.1 K/uL Final   Immature Granulocytes 12/29/2021 0  % Final   Abs Immature Granulocytes 12/29/2021 0.03  0.00 - 0.07 K/uL Final   Performed at [facility], [address] [place], [place] [address]   Sodium 12/29/2021 139  135 - 145 mmol/L Final   Potassium 12/29/2021 4.0  3.5 - 5.1 mmol/L Final   Chloride 12/29/2021 106  98 - 111 mmol/L Final   CO2 12/29/2021 25  22 - 32 mmol/L Final   Glucose, Bld 12/29/2021 106 (H)  70 - 99 mg/dL Final   Glucose reference range applies only to samples taken after fasting for at least 8 hours.   BUN 12/29/2021 20   6 - 20 mg/dL Final   Creatinine, Ser 12/29/2021 0.87  0.61 - 1.24 mg/dL Final   Calcium 68/11/8108 9.3  8.9 - 10.3 mg/dL Final   Total Protein 31/59/4585 5.8 (L)  6.5 - 8.1 g/dL Final   Albumin 92/92/4462 3.4 (L)  3.5 - 5.0 g/dL Final   AST 86/38/1771 13 (L)  15 - 41 U/L Final   ALT 12/29/2021 12  0 - 44 U/L Final   Alkaline Phosphatase 12/29/2021 52  38 - 126 U/L Final   Total Bilirubin 12/29/2021 0.4  0.3 - 1.2 mg/dL Final   GFR, Estimated 12/29/2021 >60  >60 mL/min Final   Comment: (NOTE) Calculated using the CKD-EPI Creatinine Equation (2021)    Anion gap 12/29/2021 8  5 - 15 Final   Performed at [facility], [address], [place], [place] [address]   Alcohol, Ethyl (B) 12/29/2021 <10  <10 mg/dL Final   Comment: (NOTE) Lowest detectable limit for serum alcohol is 10 mg/dL.  For medical purposes only. Performed at [facility], [address] [place], [place] [address]    Opiates 12/29/2021 NONE DETECTED  NONE DETECTED Final   Cocaine 12/29/2021 NONE DETECTED  NONE DETECTED Final   Benzodiazepines 12/29/2021 NONE DETECTED  NONE DETECTED Final   Amphetamines 12/29/2021 NONE DETECTED  NONE DETECTED Final   Tetrahydrocannabinol 12/29/2021 NONE DETECTED  NONE DETECTED Final   Barbiturates 12/29/2021 NONE DETECTED  NONE DETECTED Final   Comment: (NOTE) DRUG SCREEN FOR MEDICAL PURPOSES ONLY.  IF CONFIRMATION IS NEEDED FOR ANY PURPOSE, NOTIFY LAB WITHIN 5 DAYS.  LOWEST DETECTABLE LIMITS FOR URINE DRUG SCREEN Drug Class                     Cutoff (ng/mL) Amphetamine and metabolites    1000 Barbiturate and metabolites    200 Benzodiazepine                 200 Tricyclics and metabolites     300 Opiates and metabolites        300 Cocaine and metabolites        300 THC                            50 Performed at [facility], [address], [place], [place] [address]    Salicylate Lvl 12/29/2021 <7.0 (L)  7.0 - 30.0  mg/dL Final   Performed at [facility]  [facility], [place] [address], [place], [place] [address]   Acetaminophen (Tylenol), Serum 12/29/2021 <10 (L)  10 - 30 ug/mL Final   Comment: (NOTE) Therapeutic concentrations vary significantly. A range of 10-30 ug/mL  may be an effective concentration for many patients. However, some  are best treated at concentrations outside of this range. Acetaminophen concentrations >150 ug/mL at 4 hours after ingestion  and >50 ug/mL at 12 hours after ingestion are often associated with  toxic reactions.  Performed at [facility], [place] [address] [place], [place] [address]   Admission on 12/21/2021, Discharged on 12/22/2021  Component Date Value Ref Range Status   Acetaminophen (Tylenol), Serum 12/21/2021 <10 (L)  10 - 30 ug/mL Final   Comment: (NOTE) Therapeutic concentrations vary significantly. A range of 10-30 ug/mL  may be an effective concentration for many patients. However, some  are best treated at concentrations outside of this range. Acetaminophen concentrations >150 ug/mL at 4 hours after ingestion  and >50 ug/mL at 12 hours after ingestion are often associated with  toxic reactions.  Performed at [facility], [place] [address], [place], [place] [address]    Salicylate Lvl 45/80/9983 <7.0 (L)  7.0 - 30.0 mg/dL Final   Performed at [place] [address], [place], [place] [address]   Alcohol, Ethyl (B) 12/21/2021 <10  <10 mg/dL Final   Comment: (NOTE) Lowest detectable limit for serum alcohol is 10 mg/dL.  For medical purposes only. Performed at [facility], [place] [address] [place], [place] [address]    Opiates 12/21/2021 NONE DETECTED  NONE DETECTED Final   Cocaine 12/21/2021 NONE DETECTED  NONE DETECTED Final   Benzodiazepines 12/21/2021 NONE DETECTED  NONE DETECTED Final   Amphetamines 12/21/2021 NONE DETECTED  NONE DETECTED Final   Tetrahydrocannabinol 12/21/2021 NONE DETECTED  NONE  DETECTED Final   Barbiturates 12/21/2021 NONE DETECTED  NONE DETECTED Final   Comment: (NOTE) DRUG SCREEN FOR MEDICAL PURPOSES ONLY.  IF CONFIRMATION IS NEEDED FOR ANY PURPOSE, NOTIFY LAB WITHIN 5 DAYS.  LOWEST DETECTABLE LIMITS FOR URINE DRUG SCREEN Drug Class                     Cutoff (ng/mL) Amphetamine and metabolites    1000 Barbiturate and metabolites    200 Benzodiazepine                 734 Tricyclics and metabolites     300 Opiates and metabolites        300 Cocaine and metabolites        300 THC                            50 Performed at [name], [place] [address], [place], [place] [address]    SARS Coronavirus 2 by RT PCR 12/21/2021 NEGATIVE  NEGATIVE Final   Comment: (NOTE) SARS-CoV-2 target nucleic acids are NOT DETECTED.  The SARS-CoV-2 RNA is generally detectable in upper respiratory specimens during the acute phase of infection. The lowest concentration of SARS-CoV-2 viral copies this assay can detect is 138 copies/mL. A negative result does not preclude SARS-Cov-2 infection and should not be used as the sole basis for treatment or other patient management decisions. A negative result may occur with  improper specimen collection/handling, submission of specimen other than nasopharyngeal swab, presence of viral mutation(s) within the areas targeted by this assay, and inadequate  number of viral copies(<138 copies/mL). A negative result must be combined with clinical observations, patient history, and epidemiological information. The expected result is Negative.  Fact Sheet for Patients:  EntrepreneurPulse.com.au  Fact Sheet for Healthcare Providers:  IncredibleEmployment.be  This test is no                          t yet approved or cleared by the [place] FDA and  has been authorized for detection and/or diagnosis of SARS-CoV-2 by FDA under an Emergency Use Authorization (EUA). This EUA will remain  in  effect (meaning this test can be used) for the duration of the COVID-19 declaration under Section 564(b)(1) of the Act, 21 U.S.C.section 360bbb-3(b)(1), unless the authorization is terminated  or revoked sooner.       Influenza A by PCR 12/21/2021 NEGATIVE  NEGATIVE Final   Influenza B by PCR 12/21/2021 NEGATIVE  NEGATIVE Final   Comment: (NOTE) The Xpert Xpress SARS-CoV-2/FLU/RSV plus assay is intended as an aid in the diagnosis of influenza from Nasopharyngeal swab specimens and should not be used as a sole basis for treatment. Nasal washings and aspirates are unacceptable for Xpert Xpress SARS-CoV-2/FLU/RSV testing.  Fact Sheet for Patients: EntrepreneurPulse.com.au  Fact Sheet for Healthcare Providers: IncredibleEmployment.be  This test is not yet approved or cleared by the [place] FDA and has been authorized for detection and/or diagnosis of SARS-CoV-2 by FDA under an Emergency Use Authorization (EUA). This EUA will remain in effect (meaning this test can be used) for the duration of the COVID-19 declaration under Section 564(b)(1) of the Act, 21 U.S.C. section 360bbb-3(b)(1), unless the authorization is terminated or revoked.  Performed at [facility], [place] [address] [place], [place] [address]   Admission on 12/21/2021, Discharged on 12/21/2021  Component Date Value Ref Range Status   SARS Coronavirus 2 by RT PCR 12/21/2021 NEGATIVE  NEGATIVE Final   Comment: (NOTE) SARS-CoV-2 target nucleic acids are NOT DETECTED.  The SARS-CoV-2 RNA is generally detectable in upper respiratory specimens during the acute phase of infection. The lowest concentration of SARS-CoV-2 viral copies this assay can detect is 138 copies/mL. A negative result does not preclude SARS-Cov-2 infection and should not be used as the sole basis for treatment or other patient management decisions. A negative result may occur with  improper specimen  collection/handling, submission of specimen other than nasopharyngeal swab, presence of viral mutation(s) within the areas targeted by this assay, and inadequate number of viral copies(<138 copies/mL). A negative result must be combined with clinical observations, patient history, and epidemiological information. The expected result is Negative.  Fact Sheet for Patients:  EntrepreneurPulse.com.au  Fact Sheet for Healthcare Providers:  IncredibleEmployment.be  This test is no                          t yet approved or cleared by the [place] FDA and  has been authorized for detection and/or diagnosis of SARS-CoV-2 by FDA under an Emergency Use Authorization (EUA). This EUA will remain  in effect (meaning this test can be used) for the duration of the COVID-19 declaration under Section 564(b)(1) of the Act, 21 U.S.C.section 360bbb-3(b)(1), unless the authorization is terminated  or revoked sooner.       Influenza A by PCR 12/21/2021 NEGATIVE  NEGATIVE Final   Influenza B by PCR 12/21/2021 NEGATIVE  NEGATIVE Final   Comment: (NOTE) The Xpert Xpress SARS-CoV-2/FLU/RSV plus  assay is intended as an aid in the diagnosis of influenza from Nasopharyngeal swab specimens and should not be used as a sole basis for treatment. Nasal washings and aspirates are unacceptable for Xpert Xpress SARS-CoV-2/FLU/RSV testing.  Fact Sheet for Patients: EntrepreneurPulse.com.au  Fact Sheet for Healthcare Providers: IncredibleEmployment.be  This test is not yet approved or cleared by the [place] FDA and has been authorized for detection and/or diagnosis of SARS-CoV-2 by FDA under an Emergency Use Authorization (EUA). This EUA will remain in effect (meaning this test can be used) for the duration of the COVID-19 declaration under Section 564(b)(1) of the Act, 21 U.S.C. section 360bbb-3(b)(1), unless the authorization is  terminated or revoked.  Performed at [facility], [place] [address], [place], [place] [address]    WBC 12/21/2021 8.2  4.0 - 10.5 K/uL Final   RBC 12/21/2021 4.26  4.22 - 5.81 MIL/uL Final   Hemoglobin 12/21/2021 12.9 (L)  13.0 - 17.0 g/dL Final   HCT 12/21/2021 38.0 (L)  39.0 - 52.0 % Final   MCV 12/21/2021 89.2  80.0 - 100.0 fL Final   MCH 12/21/2021 30.3  26.0 - 34.0 pg Final   MCHC 12/21/2021 33.9  30.0 - 36.0 g/dL Final   RDW 12/21/2021 13.8  11.5 - 15.5 % Final   Platelets 12/21/2021 216  150 - 400 K/uL Final   nRBC 12/21/2021 0.0  0.0 - 0.2 % Final   Neutrophils Relative % 12/21/2021 71  % Final   Neutro Abs 12/21/2021 5.8  1.7 - 7.7 K/uL Final   Lymphocytes Relative 12/21/2021 18  % Final   Lymphs Abs 12/21/2021 1.5  0.7 - 4.0 K/uL Final   Monocytes Relative 12/21/2021 9  % Final   Monocytes Absolute 12/21/2021 0.7  0.1 - 1.0 K/uL Final   Eosinophils Relative 12/21/2021 2  % Final   Eosinophils Absolute 12/21/2021 0.2  0.0 - 0.5 K/uL Final   Basophils Relative 12/21/2021 0  % Final   Basophils Absolute 12/21/2021 0.0  0.0 - 0.1 K/uL Final   Immature Granulocytes 12/21/2021 0  % Final   Abs Immature Granulocytes 12/21/2021 0.03  0.00 - 0.07 K/uL Final   Performed at [facility], [place] [address], [place], [place] [address]   Sodium 12/21/2021 138  135 - 145 mmol/L Final   Potassium 12/21/2021 4.5  3.5 - 5.1 mmol/L Final   Chloride 12/21/2021 107  98 - 111 mmol/L Final   CO2 12/21/2021 26  22 - 32 mmol/L Final   Glucose, Bld 12/21/2021 119 (H)  70 - 99 mg/dL Final   Glucose reference range applies only to samples taken after fasting for at least 8 hours.   BUN 12/21/2021 15  6 - 20 mg/dL Final   Creatinine, Ser 12/21/2021 0.73  0.61 - 1.24 mg/dL Final   Calcium 12/21/2021 9.2  8.9 - 10.3 mg/dL Final   Total Protein 12/21/2021 5.7 (L)  6.5 - 8.1 g/dL Final   Albumin 12/21/2021 3.8  3.5 - 5.0 g/dL Final   AST 12/21/2021 19  15 - 41 U/L Final   ALT 12/21/2021 16   0 - 44 U/L Final   Alkaline Phosphatase 12/21/2021 57  38 - 126 U/L Final   Total Bilirubin 12/21/2021 0.3  0.3 - 1.2 mg/dL Final   GFR, Estimated 12/21/2021 >60  >60 mL/min Final   Comment: (NOTE) Calculated using the CKD-EPI Creatinine Equation (2021)    Anion gap 12/21/2021 5  5 - 15  Final   Performed at [facility], [address], [place], [place] [address]   Hgb A1c MFr Bld 12/21/2021 5.0  4.8 - 5.6 % Final   Comment: (NOTE) Pre diabetes:          5.7%-6.4%  Diabetes:              >6.4%  Glycemic control for   <7.0% adults with diabetes    Mean Plasma Glucose 12/21/2021 96.8  mg/dL Final   Performed at [facility], [address] [place], [place] [address]   Alcohol, Ethyl (B) 12/21/2021 <10  <10 mg/dL Final   Comment: (NOTE) Lowest detectable limit for serum alcohol is 10 mg/dL.  For medical purposes only. Performed at [facility], [address] [place], [place] [address]    TSH 12/21/2021 3.113  0.350 - 4.500 uIU/mL Final   Comment: Performed by a 3rd Generation assay with a functional sensitivity of <=0.01 uIU/mL. Performed at [facility], [address] [place], [place] [address]    POC Amphetamine UR 12/21/2021 None Detected  NONE DETECTED (Cut Off Level 1000 ng/mL) Final   POC Secobarbital (BAR) 12/21/2021 None Detected  NONE DETECTED (Cut Off Level 300 ng/mL) Final   POC Buprenorphine (BUP) 12/21/2021 None Detected  NONE DETECTED (Cut Off Level 10 ng/mL) Final   POC Oxazepam (BZO) 12/21/2021 None Detected  NONE DETECTED (Cut Off Level 300 ng/mL) Final   POC Cocaine UR 12/21/2021 None Detected  NONE DETECTED (Cut Off Level 300 ng/mL) Final   POC Methamphetamine UR 12/21/2021 None Detected  NONE DETECTED (Cut Off Level 1000 ng/mL) Final   POC Morphine 12/21/2021 None Detected  NONE DETECTED (Cut Off Level 300 ng/mL) Final   POC Methadone UR 12/21/2021 None Detected  NONE DETECTED (Cut Off Level 300 ng/mL) Final   POC Oxycodone UR  12/21/2021 None Detected  NONE DETECTED (Cut Off Level 100 ng/mL) Final   POC Marijuana UR 12/21/2021 None Detected  NONE DETECTED (Cut Off Level 50 ng/mL) Final   SARSCOV2ONAVIRUS 2 AG 12/21/2021 NEGATIVE  NEGATIVE Final   Comment: (NOTE) SARS-CoV-2 antigen NOT DETECTED.   Negative results are presumptive.  Negative results do not preclude SARS-CoV-2 infection and should not be used as the sole basis for treatment or other patient management decisions, including infection  control decisions, particularly in the presence of clinical signs and  symptoms consistent with COVID-19, or in those who have been in contact with the virus.  Negative results must be combined with clinical observations, patient history, and epidemiological information. The expected result is Negative.  Fact Sheet for Patients: https://www.jennings-kim.com/  Fact Sheet for Healthcare Providers: https://alexander-rogers.biz/  This test is not yet approved or cleared by the [place] FDA and  has been authorized for detection and/or diagnosis of SARS-CoV-2 by FDA under an Emergency Use Authorization (EUA).  This EUA will remain in effect (meaning this test can be used) for the duration of  the COV                          ID-19 declaration under Section 564(b)(1) of the Act, 21 U.S.C. section 360bbb-3(b)(1), unless the authorization is terminated or revoked sooner.     Cholesterol 12/21/2021 146  0 - 200 mg/dL Final   Triglycerides 46/56/8127 94  <150 mg/dL Final   HDL 51/70/0174 67  >40 mg/dL Final   Total CHOL/HDL Ratio 12/21/2021 2.2  RATIO Final  VLDL 12/21/2021 19  0 - 40 mg/dL Final   LDL Cholesterol 12/21/2021 60  0 - 99 mg/dL Final   Comment:        Total Cholesterol/HDL:CHD Risk Coronary Heart Disease Risk Table                     Men   Women  1/2 Average Risk   3.4   3.3  Average Risk       5.0   4.4  2 X Average Risk   9.6   7.1  3 X Average Risk  23.4   11.0         Use the calculated Patient Ratio above and the CHD Risk Table to determine the patient's CHD Risk.        ATP III CLASSIFICATION (LDL):  <100     mg/dL   Optimal  100-129  mg/dL   Near or Above                    Optimal  130-159  mg/dL   Borderline  160-189  mg/dL   High  >190     mg/dL   Very High Performed at [place] [address] [place], [place] [address]   Admission on 08/31/2021, Discharged on 09/01/2021  Component Date Value Ref Range Status   SARS Coronavirus 2 by RT PCR 08/31/2021 NEGATIVE  NEGATIVE Final   Comment: (NOTE) SARS-CoV-2 target nucleic acids are NOT DETECTED.  The SARS-CoV-2 RNA is generally detectable in upper respiratory specimens during the acute phase of infection. The lowest concentration of SARS-CoV-2 viral copies this assay can detect is 138 copies/mL. A negative result does not preclude SARS-Cov-2 infection and should not be used as the sole basis for treatment or other patient management decisions. A negative result may occur with  improper specimen collection/handling, submission of specimen other than nasopharyngeal swab, presence of viral mutation(s) within the areas targeted by this assay, and inadequate number of viral copies(<138 copies/mL). A negative result must be combined with clinical observations, patient history, and epidemiological information. The expected result is Negative.  Fact Sheet for Patients:  EntrepreneurPulse.com.au  Fact Sheet for Healthcare Providers:  IncredibleEmployment.be  This test is no                          t yet approved or cleared by the [place] FDA and  has been authorized for detection and/or diagnosis of SARS-CoV-2 by FDA under an Emergency Use Authorization (EUA). This EUA will remain  in effect (meaning this test can be used) for the duration of the COVID-19 declaration under Section 564(b)(1) of the Act, 21 U.S.C.section 360bbb-3(b)(1),  unless the authorization is terminated  or revoked sooner.       Influenza A by PCR 08/31/2021 NEGATIVE  NEGATIVE Final   Influenza B by PCR 08/31/2021 NEGATIVE  NEGATIVE Final   Comment: (NOTE) The Xpert Xpress SARS-CoV-2/FLU/RSV plus assay is intended as an aid in the diagnosis of influenza from Nasopharyngeal swab specimens and should not be used as a sole basis for treatment. Nasal washings and aspirates are unacceptable for Xpert Xpress SARS-CoV-2/FLU/RSV testing.  Fact Sheet for Patients: EntrepreneurPulse.com.au  Fact Sheet for Healthcare Providers: IncredibleEmployment.be  This test is not yet approved or cleared by the [place] FDA and has been authorized for detection and/or diagnosis of SARS-CoV-2 by FDA under an Emergency Use Authorization (EUA). This EUA  will remain in effect (meaning this test can be used) for the duration of the COVID-19 declaration under Section 564(b)(1) of the Act, 21 U.S.C. section 360bbb-3(b)(1), unless the authorization is terminated or revoked.  Performed at [facility], [place] [address] [place], [place] [address]    WBC 08/31/2021 6.3  4.0 - 10.5 K/uL Final   RBC 08/31/2021 4.32  4.22 - 5.81 MIL/uL Final   Hemoglobin 08/31/2021 13.4  13.0 - 17.0 g/dL Final   HCT 08/31/2021 39.9  39.0 - 52.0 % Final   MCV 08/31/2021 92.4  80.0 - 100.0 fL Final   MCH 08/31/2021 31.0  26.0 - 34.0 pg Final   MCHC 08/31/2021 33.6  30.0 - 36.0 g/dL Final   RDW 08/31/2021 13.6  11.5 - 15.5 % Final   Platelets 08/31/2021 230  150 - 400 K/uL Final   nRBC 08/31/2021 0.0  0.0 - 0.2 % Final   Neutrophils Relative % 08/31/2021 64  % Final   Neutro Abs 08/31/2021 4.0  1.7 - 7.7 K/uL Final   Lymphocytes Relative 08/31/2021 25  % Final   Lymphs Abs 08/31/2021 1.6  0.7 - 4.0 K/uL Final   Monocytes Relative 08/31/2021 9  % Final   Monocytes Absolute 08/31/2021 0.6  0.1 - 1.0 K/uL Final   Eosinophils Relative 08/31/2021  1  % Final   Eosinophils Absolute 08/31/2021 0.1  0.0 - 0.5 K/uL Final   Basophils Relative 08/31/2021 1  % Final   Basophils Absolute 08/31/2021 0.0  0.0 - 0.1 K/uL Final   Immature Granulocytes 08/31/2021 0  % Final   Abs Immature Granulocytes 08/31/2021 0.01  0.00 - 0.07 K/uL Final   Performed at [facility], [place] [address], [place], [place] [address]   Sodium 08/31/2021 140  135 - 145 mmol/L Final   Potassium 08/31/2021 4.7  3.5 - 5.1 mmol/L Final   Chloride 08/31/2021 107  98 - 111 mmol/L Final   CO2 08/31/2021 26  22 - 32 mmol/L Final   Glucose, Bld 08/31/2021 105 (H)  70 - 99 mg/dL Final   Glucose reference range applies only to samples taken after fasting for at least 8 hours.   BUN 08/31/2021 17  6 - 20 mg/dL Final   Creatinine, Ser 08/31/2021 0.73  0.61 - 1.24 mg/dL Final   Calcium 08/31/2021 9.3  8.9 - 10.3 mg/dL Final   Total Protein 08/31/2021 5.7 (L)  6.5 - 8.1 g/dL Final   Albumin 08/31/2021 3.9  3.5 - 5.0 g/dL Final   AST 08/31/2021 20  15 - 41 U/L Final   ALT 08/31/2021 16  0 - 44 U/L Final   Alkaline Phosphatase 08/31/2021 76  38 - 126 U/L Final   Total Bilirubin 08/31/2021 0.7  0.3 - 1.2 mg/dL Final   GFR, Estimated 08/31/2021 >60  >60 mL/min Final   Comment: (NOTE) Calculated using the CKD-EPI Creatinine Equation (2021)    Anion gap 08/31/2021 7  5 - 15 Final   Performed at [place] [address] [place], [place] [address]   Alcohol, Ethyl (B) 08/31/2021 <10  <10 mg/dL Final   Comment: (NOTE) Lowest detectable limit for serum alcohol is 10 mg/dL.  For medical purposes only. Performed at [facility], [place] [address], [place], [place] [address]    POC Amphetamine UR 08/31/2021 None Detected  NONE DETECTED (Cut Off Level 1000 ng/mL) Preliminary   POC Secobarbital (BAR) 08/31/2021 None Detected  NONE DETECTED (  Cut Off Level 300 ng/mL) Preliminary   POC Buprenorphine (BUP) 08/31/2021 None Detected  NONE DETECTED (Cut Off Level 10 ng/mL)  Preliminary   POC Oxazepam (BZO) 08/31/2021 None Detected  NONE DETECTED (Cut Off Level 300 ng/mL) Preliminary   POC Cocaine UR 08/31/2021 None Detected  NONE DETECTED (Cut Off Level 300 ng/mL) Preliminary   POC Methamphetamine UR 08/31/2021 None Detected  NONE DETECTED (Cut Off Level 1000 ng/mL) Preliminary   POC Morphine 08/31/2021 None Detected  NONE DETECTED (Cut Off Level 300 ng/mL) Preliminary   POC Oxycodone UR 08/31/2021 None Detected  NONE DETECTED (Cut Off Level 100 ng/mL) Preliminary   POC Methadone UR 08/31/2021 None Detected  NONE DETECTED (Cut Off Level 300 ng/mL) Preliminary   POC Marijuana UR 08/31/2021 None Detected  NONE DETECTED (Cut Off Level 50 ng/mL) Preliminary   SARSCOV2ONAVIRUS 2 AG 08/31/2021 NEGATIVE  NEGATIVE Final   Comment: (NOTE) SARS-CoV-2 antigen NOT DETECTED.   Negative results are presumptive.  Negative results do not preclude SARS-CoV-2 infection and should not be used as the sole basis for treatment or other patient management decisions, including infection  control decisions, particularly in the presence of clinical signs and  symptoms consistent with COVID-19, or in those who have been in contact with the virus.  Negative results must be combined with clinical observations, patient history, and epidemiological information. The expected result is Negative.  Fact Sheet for Patients: HandmadeRecipes.com.cy  Fact Sheet for Healthcare Providers: FuneralLife.at  This test is not yet approved or cleared by the [place] FDA and  has been authorized for detection and/or diagnosis of SARS-CoV-2 by FDA under an Emergency Use Authorization (EUA).  This EUA will remain in effect (meaning this test can be used) for the duration of  the COV                          ID-19 declaration under Section 564(b)(1) of the Act, 21 U.S.C. section 360bbb-3(b)(1), unless the authorization is terminated or revoked  sooner.    Admission on 08/29/2021, Discharged on 08/29/2021  Component Date Value Ref Range Status   Glucose-Capillary 08/29/2021 100 (H)  70 - 99 mg/dL Final   Glucose reference range applies only to samples taken after fasting for at least 8 hours.  Admission on 08/27/2021, Discharged on 08/28/2021  Component Date Value Ref Range Status   Sodium 08/27/2021 136  135 - 145 mmol/L Final   Potassium 08/27/2021 4.2  3.5 - 5.1 mmol/L Final   Chloride 08/27/2021 104  98 - 111 mmol/L Final   CO2 08/27/2021 25  22 - 32 mmol/L Final   Glucose, Bld 08/27/2021 97  70 - 99 mg/dL Final   Glucose reference range applies only to samples taken after fasting for at least 8 hours.   BUN 08/27/2021 14  6 - 20 mg/dL Final   Creatinine, Ser 08/27/2021 0.59 (L)  0.61 - 1.24 mg/dL Final   Calcium 08/27/2021 9.5  8.9 - 10.3 mg/dL Final   Total Protein 08/27/2021 6.9  6.5 - 8.1 g/dL Final   Albumin 08/27/2021 4.2  3.5 - 5.0 g/dL Final   AST 08/27/2021 16  15 - 41 U/L Final   ALT 08/27/2021 14  0 - 44 U/L Final   Alkaline Phosphatase 08/27/2021 74  38 - 126 U/L Final   Total Bilirubin 08/27/2021 0.5  0.3 - 1.2 mg/dL Final   GFR, Estimated 08/27/2021 >60  >60 mL/min Final   Comment: (NOTE)  Calculated using the CKD-EPI Creatinine Equation (2021)    Anion gap 08/27/2021 7  5 - 15 Final   Performed at [facility], [place] [address] [place], [place] [address]   WBC 08/27/2021 6.5  4.0 - 10.5 K/uL Final   RBC 08/27/2021 4.49  4.22 - 5.81 MIL/uL Final   Hemoglobin 08/27/2021 14.1  13.0 - 17.0 g/dL Final   HCT 08/27/2021 41.2  39.0 - 52.0 % Final   MCV 08/27/2021 91.8  80.0 - 100.0 fL Final   MCH 08/27/2021 31.4  26.0 - 34.0 pg Final   MCHC 08/27/2021 34.2  30.0 - 36.0 g/dL Final   RDW 08/27/2021 14.1  11.5 - 15.5 % Final   Platelets 08/27/2021 218  150 - 400 K/uL Final   nRBC 08/27/2021 0.0  0.0 - 0.2 % Final   Performed at [facility], [place] [address] [place],  [place] [address]   Glucose-Capillary 08/27/2021 116 (H)  70 - 99 mg/dL Final   Glucose reference range applies only to samples taken after fasting for at least 8 hours.   Acetaminophen (Tylenol), Serum 08/27/2021 <10 (L)  10 - 30 ug/mL Final   Comment: (NOTE) Therapeutic concentrations vary significantly. A range of 10-30 ug/mL  may be an effective concentration for many patients. However, some  are best treated at concentrations outside of this range. Acetaminophen concentrations >150 ug/mL at 4 hours after ingestion  and >50 ug/mL at 12 hours after ingestion are often associated with  toxic reactions.  Performed at [facility], [place] [address] [place], [place] [address]    Opiates 08/27/2021 NONE DETECTED  NONE DETECTED Final   Cocaine 08/27/2021 NONE DETECTED  NONE DETECTED Final   Benzodiazepines 08/27/2021 NONE DETECTED  NONE DETECTED Final   Amphetamines 08/27/2021 NONE DETECTED  NONE DETECTED Final   Tetrahydrocannabinol 08/27/2021 NONE DETECTED  NONE DETECTED Final   Barbiturates 08/27/2021 NONE DETECTED  NONE DETECTED Final   Comment: (NOTE) DRUG SCREEN FOR MEDICAL PURPOSES ONLY.  IF CONFIRMATION IS NEEDED FOR ANY PURPOSE, NOTIFY LAB WITHIN 5 DAYS.  LOWEST DETECTABLE LIMITS FOR URINE DRUG SCREEN Drug Class                     Cutoff (ng/mL) Amphetamine and metabolites    1000 Barbiturate and metabolites    200 Benzodiazepine                 [id number] Tricyclics and metabolites     300 Opiates and metabolites        300 Cocaine and metabolites        300 THC                            50 Performed at [facility], [place] [address], [place], [place] [id number]    Salicylate Lvl [id number] <7.0 (L)  7.0 - 30.0 mg/dL Final   Performed at [place] [address], [place], [place] [address]   Alcohol, Ethyl (B) 08/27/2021 <10  <10 mg/dL Final   Comment: (NOTE) Lowest detectable limit for serum alcohol is 10 mg/dL.  For  medical purposes only. Performed at [facility], [place] [address] [place], [place] [address]    SARS Coronavirus 2 by RT PCR 08/27/2021 NEGATIVE  NEGATIVE Final   Comment: (NOTE) SARS-CoV-2 target nucleic acids are NOT DETECTED.  The SARS-CoV-2 RNA is generally detectable  in upper respiratory specimens during the acute phase of infection. The lowest concentration of SARS-CoV-2 viral copies this assay can detect is 138 copies/mL. A negative result does not preclude SARS-Cov-2 infection and should not be used as the sole basis for treatment or other patient management decisions. A negative result may occur with  improper specimen collection/handling, submission of specimen other than nasopharyngeal swab, presence of viral mutation(s) within the areas targeted by this assay, and inadequate number of viral copies(<138 copies/mL). A negative result must be combined with clinical observations, patient history, and epidemiological information. The expected result is Negative.  Fact Sheet for Patients:  EntrepreneurPulse.com.au  Fact Sheet for Healthcare Providers:  IncredibleEmployment.be  This test is no                          t yet approved or cleared by the [place] FDA and  has been authorized for detection and/or diagnosis of SARS-CoV-2 by FDA under an Emergency Use Authorization (EUA). This EUA will remain  in effect (meaning this test can be used) for the duration of the COVID-19 declaration under Section 564(b)(1) of the Act, 21 U.S.C.section 360bbb-3(b)(1), unless the authorization is terminated  or revoked sooner.       Influenza A by PCR 08/27/2021 NEGATIVE  NEGATIVE Final   Influenza B by PCR 08/27/2021 NEGATIVE  NEGATIVE Final   Comment: (NOTE) The Xpert Xpress SARS-CoV-2/FLU/RSV plus assay is intended as an aid in the diagnosis of influenza from Nasopharyngeal swab specimens and should not be used as a sole  basis for treatment. Nasal washings and aspirates are unacceptable for Xpert Xpress SARS-CoV-2/FLU/RSV testing.  Fact Sheet for Patients: EntrepreneurPulse.com.au  Fact Sheet for Healthcare Providers: IncredibleEmployment.be  This test is not yet approved or cleared by the [place] FDA and has been authorized for detection and/or diagnosis of SARS-CoV-2 by FDA under an Emergency Use Authorization (EUA). This EUA will remain in effect (meaning this test can be used) for the duration of the COVID-19 declaration under Section 564(b)(1) of the Act, 21 U.S.C. section 360bbb-3(b)(1), unless the authorization is terminated or revoked.  Performed at [facility], [place] [address] [place], [place] [address]   Admission on 08/18/2021, Discharged on 08/19/2021  Component Date Value Ref Range Status   SARS Coronavirus 2 by RT PCR 08/18/2021 NEGATIVE  NEGATIVE Final   Comment: (NOTE) SARS-CoV-2 target nucleic acids are NOT DETECTED.  The SARS-CoV-2 RNA is generally detectable in upper respiratory specimens during the acute phase of infection. The lowest concentration of SARS-CoV-2 viral copies this assay can detect is 138 copies/mL. A negative result does not preclude SARS-Cov-2 infection and should not be used as the sole basis for treatment or other patient management decisions. A negative result may occur with  improper specimen collection/handling, submission of specimen other than nasopharyngeal swab, presence of viral mutation(s) within the areas targeted by this assay, and inadequate number of viral copies(<138 copies/mL). A negative result must be combined with clinical observations, patient history, and epidemiological information. The expected result is Negative.  Fact Sheet for Patients:  EntrepreneurPulse.com.au  Fact Sheet for Healthcare Providers:  IncredibleEmployment.be  This  test is no                          t yet approved or cleared by the [place] FDA and  has been authorized for detection and/or diagnosis of SARS-CoV-2 by FDA under  an Emergency Use Authorization (EUA). This EUA will remain  in effect (meaning this test can be used) for the duration of the COVID-19 declaration under Section 564(b)(1) of the Act, 21 U.S.C.section 360bbb-3(b)(1), unless the authorization is terminated  or revoked sooner.       Influenza A by PCR 08/18/2021 NEGATIVE  NEGATIVE Final   Influenza B by PCR 08/18/2021 NEGATIVE  NEGATIVE Final   Comment: (NOTE) The Xpert Xpress SARS-CoV-2/FLU/RSV plus assay is intended as an aid in the diagnosis of influenza from Nasopharyngeal swab specimens and should not be used as a sole basis for treatment. Nasal washings and aspirates are unacceptable for Xpert Xpress SARS-CoV-2/FLU/RSV testing.  Fact Sheet for Patients: EntrepreneurPulse.com.au  Fact Sheet for Healthcare Providers: IncredibleEmployment.be  This test is not yet approved or cleared by the [place] FDA and has been authorized for detection and/or diagnosis of SARS-CoV-2 by FDA under an Emergency Use Authorization (EUA). This EUA will remain in effect (meaning this test can be used) for the duration of the COVID-19 declaration under Section 564(b)(1) of the Act, 21 U.S.C. section 360bbb-3(b)(1), unless the authorization is terminated or revoked.  Performed at [facility], [place] [place], [place] [address]     Blood Alcohol level:  Lab Results  Component Value Date   [facility] <10 01/04/2022   ETH <10 [id number]    Metabolic Disorder Labs: Lab Results  Component Value Date   HGBA1C 5.0 12/21/2021   MPG 96.8 12/21/2021   No results found for: "PROLACTIN" Lab Results  Component Value Date   CHOL 146 12/21/2021   TRIG 94 12/21/2021   HDL 67 12/21/2021   CHOLHDL 2.2 12/21/2021   VLDL 19  12/21/2021   LDLCALC 60 12/21/2021   LDLCALC 49 03/11/2021    Therapeutic Lab Levels: No results found for: "LITHIUM" No results found for: "VALPROATE" No results found for: "CBMZ"  Physical Findings   PHQ2-9    Flowsheet Row ED from 08/31/2021 in [facility]  PHQ-2 Total Score 1      [place] ED from [facility]/21/2022 in [facility] Most recent reading at 01/21/2022 11:24 PM ED from 01/21/2022 in [place] Most recent reading at 01/21/2022 11:16 AM ED from 01/07/2022 in [facility] Most recent reading at 01/07/2022  3:05 PM  C-SSRS RISK CATEGORY High Risk Error: Q3, 4, or 5 should not be populated when Q2 is No Error: Question 6 not populated        Musculoskeletal  Strength & Muscle Tone: within normal limits Gait & Station: normal Patient leans: N/A  Psychiatric Specialty Exam  Presentation  General Appearance: Disheveled  Eye Contact:Fair  Speech:Normal Rate  Speech Volume:Decreased  Handedness:Right   Mood and Affect  Mood:Irritable  Affect:Congruent   Thought Process  Thought Processes:Coherent  Descriptions of Associations:Intact  Orientation:Partial  Thought Content:WDL  Diagnosis of Schizophrenia or Schizoaffective disorder in past: Yes  Duration of Psychotic Symptoms: Greater than six months   Hallucinations:Hallucinations: None  Ideas of Reference:None  Suicidal Thoughts:Suicidal Thoughts: No  Homicidal Thoughts:Homicidal Thoughts: No   Sensorium  Memory:Immediate Poor; Recent Poor; Remote Poor  Judgment:Impaired  Insight:Lacking   Executive Functions  Concentration:Poor  Attention Span:Poor  Recall:Poor  Fund of Knowledge:Poor  Language:Fair   Psychomotor Activity  Psychomotor Activity:Psychomotor Activity: Normal   Assets  Assets:Desire for Improvement; Physical Health   Sleep  Sleep:Sleep:  Fair Number of Hours of Sleep:  5   Nutritional Assessment (For OBS and FBC admissions only) Has the patient had a weight loss or gain of 10 pounds or more in the last 3 months?: No Has the patient had a decrease in food intake/or appetite?: No Does the patient have dental problems?: No Does the patient have eating habits or behaviors that may be indicators of an eating disorder including binging or inducing vomiting?: No Has the patient recently lost weight without trying?: 0 Has the patient been eating poorly because of a decreased appetite?: 0 Malnutrition Screening Tool Score: 0    Physical Exam  Physical Exam Vitals and nursing note reviewed.  Cardiovascular:     Rate and Rhythm: Normal rate and regular rhythm.  Neurological:     Mental Status: He is alert and oriented to person, place, and time.  Psychiatric:        Mood and Affect: Mood normal.        Behavior: Behavior normal.    Review of Systems  Eyes: Negative.   Cardiovascular: Negative.   Gastrointestinal: Negative.   Psychiatric/Behavioral:  Positive for depression, hallucinations and suicidal ideas. The patient is nervous/anxious.   All other systems reviewed and are negative.  Blood pressure 90/62, pulse 91, temperature 98.9 F (37.2 C), temperature source Tympanic, resp. rate 16, SpO2 100 %. There is no height or weight on file to calculate BMI.  Treatment Plan Summary: Daily contact with patient to assess and evaluate symptoms and progress in treatment and Medication management  Continue with current treatment plan on 01/22/2022 as listed below except were noted  See chart for agitation protocols Initiated Zyprexa 5 mg nightly Continue citalopram 10 mg daily  CSW to continue seeking inpatient admission  Derrill Center, NP 01/22/2022 12:03 PM

## 2022-01-22 NOTE — ED Notes (Signed)
Pt sleeping@this time. Breathing even and unlabored will continue to monitor for safety 

## 2022-01-22 NOTE — ED Triage Notes (Signed)
BIB [facility] from [facility] under IVC. Pt apparently saying racial slurs, behaving manic, and being aggressive.

## 2022-01-22 NOTE — ED Notes (Signed)
Pt placed sheet over his face, this nurse removed the sheet but Pt replaced the sheet back over his face. Can visualize patients chest rising and can hear pt snoring. Safety maintained and will continue to monitor.

## 2022-01-22 NOTE — ED Notes (Signed)
EMS/[facility] called for transport to [facility]

## 2022-01-22 NOTE — ED Notes (Signed)
Patient sent to [facility] via EMS and [employer]. Walked on his own to ambulance - no sxs of distress noted

## 2022-01-22 NOTE — ED Notes (Signed)
Pt. Beating on window.. Screaming racial slurs to staff.

## 2022-01-22 NOTE — ED Provider Notes (Signed)
[facility] Continuous Assessment Admission H&P  Date: 01/22/22 Patient Name: Roberto Osborn MRN: [id number] Chief Complaint:  Chief Complaint  Patient presents with   IVC    Agitation      Diagnoses:  Final diagnoses:  Severe episode of recurrent major depressive disorder, with psychotic features [facility])  Homelessness    HPI: Roberto Osborn is a 51 year old male with psychiatric history of depression, anxiety and homelessness.  Patient was brought to [facility] by law enforcement for mental health evaluation.  On presentation, patient is noted to be very agitated.  he is yelling and cursing at staffs, and using racial slurs towards African-American staffs. He is uncooperative with assessment and nonredirectable with verbal prompts. Patient was given Ziprasidone 20mg  IM and Ativan 1 mg IM for agitation after he became physically aggressive and throwing food at staff.   Patient was evaluated face-to-face after administration of agitation medications.  On assessment, patient is irritable and drowsy but arousable to verbal prompts. He is minimally cooperative with assessment and answered few questions. His speech is clear and coherent. Patient's mood is irritable with a flat affect. He didn't appear to be responding to any internal/external stimuli or experiencing any delusional thought content during this assessment.   He reports that he is in need of a shelter and medications for mental health. He states "I need a place to stay, I need medication to feel better, I'm dirty, I need to take a shower, and get something to eat."  He does not know the names of his medication or who prescries his medications. Patient denies suicidal ideation, homicidal ideation, hallucination, and substance use.  He refused to discuss what led to him being IVC'd by [employer].   Per IVC paperwork: "Respondent was in parking lot at [employer]. Respondent was wearing a hospital bracelet. Refused to  give any information to [facility]. Respondent did not want to go to the hospital, and would not tell [facility] what hospital he came from. Respondent made multiple statements about wanting to cut his genitals off and punching himself in the face. Respondent was exposing himself, cursing and making derogatory statements regarding those of another race. Respondent may have Tourette Syndrome. Respondent is a danger to himself and others."   PHQ 2-9:   Flowsheet Row ED from 01/21/2022 in [facility] Most recent reading at 01/21/2022 11:24 PM ED from 01/21/2022 in [facility] EMERGENCY DEPARTMENT Most recent reading at 01/21/2022 11:16 AM ED from 01/07/2022 in [facility] Most recent reading at 01/07/2022  3:05 PM  C-SSRS RISK CATEGORY High Risk Error: Q3, 4, or 5 should not be populated when Q2 is No Error: Question 6 not populated        Total Time spent with patient: 30 minutes  Musculoskeletal  Strength & Muscle Tone: within normal limits Gait & Station: normal Patient leans: Right  Psychiatric Specialty Exam  Presentation General Appearance: Disheveled  Eye Contact:Fair  Speech:Normal Rate  Speech Volume:Decreased  Handedness:Right   Mood and Affect  Mood:Irritable  Affect:Congruent   Thought Process  Thought Processes:Coherent  Descriptions of Associations:Intact  Orientation:Partial  Thought Content:WDL  Diagnosis of Schizophrenia or Schizoaffective disorder in past: Yes  Duration of Psychotic Symptoms: Greater than six months  Hallucinations:Hallucinations: None  Ideas of Reference:None  Suicidal Thoughts:Suicidal Thoughts: No  Homicidal Thoughts:Homicidal Thoughts: No   Sensorium  Memory:Immediate Poor; Recent Poor; Remote Poor  Judgment:Impaired  Insight:Lacking   Art therapist  Concentration:Poor  Attention Span:Poor  Recall:Poor  Fund of  Knowledge:Poor  Language:Fair   Psychomotor Activity  Psychomotor Activity:Psychomotor Activity: Normal   Assets  Assets:Desire for Improvement; Physical Health   Sleep  Sleep:Sleep: Fair Number of Hours of Sleep: 5   Nutritional Assessment (For OBS and FBC admissions only) Has the patient had a weight loss or gain of 10 pounds or more in the last 3 months?: No Has the patient had a decrease in food intake/or appetite?: No Does the patient have dental problems?: No Does the patient have eating habits or behaviors that may be indicators of an eating disorder including binging or inducing vomiting?: No Has the patient recently lost weight without trying?: 0 Has the patient been eating poorly because of a decreased appetite?: 0 Malnutrition Screening Tool Score: 0    Physical Exam Vitals and nursing note reviewed.  Constitutional:      General: He is not in acute distress.    Appearance: He is well-developed. He is not ill-appearing.  HENT:     Head: Normocephalic and atraumatic.  Eyes:     Conjunctiva/sclera: Conjunctivae normal.  Cardiovascular:     Rate and Rhythm: Normal rate.  Pulmonary:     Effort: Pulmonary effort is normal. No respiratory distress.  Abdominal:     Tenderness: There is no abdominal tenderness.  Musculoskeletal:        General: No swelling.     Cervical back: Neck supple.  Skin:    General: Skin is warm and dry.     Capillary Refill: Capillary refill takes less than 2 seconds.  Neurological:     Mental Status: He is alert and oriented to person, place, and time.  Psychiatric:        Attention and Perception: Attention and perception normal.        Mood and Affect: Affect is flat.        Speech: Speech normal.        Behavior: Behavior is agitated and aggressive.        Thought Content: Thought content normal.        Cognition and Memory: Cognition normal.    Review of Systems  Constitutional: Negative.   HENT: Negative.    Eyes:  Negative.   Respiratory: Negative.    Cardiovascular: Negative.   Gastrointestinal: Negative.   Genitourinary: Negative.   Musculoskeletal: Negative.   Skin: Negative.   Neurological: Negative.   Endo/Heme/Allergies: Negative.   Psychiatric/Behavioral: Negative.      Blood pressure (!) 151/84, pulse 89, temperature 98.9 F (37.2 C), temperature source Oral, resp. rate 20, SpO2 100 %. There is no height or weight on file to calculate BMI.  Past Psychiatric History:    Is the patient at risk to self? Yes  Has the patient been a risk to self in the past 6 months? Yes .    Has the patient been a risk to self within the distant past? Yes   Is the patient a risk to others? No   Has the patient been a risk to others in the past 6 months? No   Has the patient been a risk to others within the distant past? No   Past Medical History:  Past Medical History:  Diagnosis Date   Homeless    No past surgical history on file.  Family History: No family history on file.  Social History:  Social History   Socioeconomic History   Marital status: Single    Spouse name:  Not on file   Number of children: Not on file   Years of education: Not on file   Highest education level: Not on file  Occupational History   Not on file  Tobacco Use   Smoking status: Every Day    Types: Cigarettes, Cigars   Smokeless tobacco: Not on file  Vaping Use   Vaping Use: Never used  Substance and Sexual Activity   Alcohol use: Yes    Comment: occasionally   Drug use: Not Currently    Types: Marijuana   Sexual activity: Not on file  Other Topics Concern   Not on file  Social History Narrative   Not on file   Social Determinants of Health   Financial Resource Strain: Not on file  Food Insecurity: Not on file  Transportation Needs: Not on file  Physical Activity: Not on file  Stress: Not on file  Social Connections: Not on file  Intimate Partner Violence: Not on file    SDOH:  SDOH Screenings    Depression (PHQ2-9): Low Risk  (08/31/2021)  Tobacco Use: High Risk (01/21/2022)    Last Labs:  Admission on 01/21/2022  Component Date Value Ref Range Status   SARS Coronavirus 2 by RT PCR 01/21/2022 NEGATIVE  NEGATIVE Final   Comment: (NOTE) SARS-CoV-2 target nucleic acids are NOT DETECTED.  The SARS-CoV-2 RNA is generally detectable in upper respiratory specimens during the acute phase of infection. The lowest concentration of SARS-CoV-2 viral copies this assay can detect is 138 copies/mL. A negative result does not preclude SARS-Cov-2 infection and should not be used as the sole basis for treatment or other patient management decisions. A negative result may occur with  improper specimen collection/handling, submission of specimen other than nasopharyngeal swab, presence of viral mutation(s) within the areas targeted by this assay, and inadequate number of viral copies(<138 copies/mL). A negative result must be combined with clinical observations, patient history, and epidemiological information. The expected result is Negative.  Fact Sheet for Patients:  BloggerCourse.com  Fact Sheet for Healthcare Providers:  SeriousBroker.it  This test is no                          t yet approved or cleared by the [place] FDA and  has been authorized for detection and/or diagnosis of SARS-CoV-2 by FDA under an Emergency Use Authorization (EUA). This EUA will remain  in effect (meaning this test can be used) for the duration of the COVID-19 declaration under Section 564(b)(1) of the Act, 21 U.S.C.section 360bbb-3(b)(1), unless the authorization is terminated  or revoked sooner.       Influenza A by PCR 01/21/2022 NEGATIVE  NEGATIVE Final   Influenza B by PCR 01/21/2022 NEGATIVE  NEGATIVE Final   Comment: (NOTE) The Xpert Xpress SARS-CoV-2/FLU/RSV plus assay is intended as an aid in the diagnosis of influenza from  Nasopharyngeal swab specimens and should not be used as a sole basis for treatment. Nasal washings and aspirates are unacceptable for Xpert Xpress SARS-CoV-2/FLU/RSV testing.  Fact Sheet for Patients: BloggerCourse.com  Fact Sheet for Healthcare Providers: SeriousBroker.it  This test is not yet approved or cleared by the [place] FDA and has been authorized for detection and/or diagnosis of SARS-CoV-2 by FDA under an Emergency Use Authorization (EUA). This EUA will remain in effect (meaning this test can be used) for the duration of the COVID-19 declaration under Section 564(b)(1) of the Act, 21 U.S.C. section 360bbb-3(b)(1), unless the authorization  is terminated or revoked.  Performed at [facility], [place] [address], [place], [place] [address]    SARSCOV2ONAVIRUS 2 AG 01/21/2022 NEGATIVE  NEGATIVE Final   Comment: (NOTE) SARS-CoV-2 antigen NOT DETECTED.   Negative results are presumptive.  Negative results do not preclude SARS-CoV-2 infection and should not be used as the sole basis for treatment or other patient management decisions, including infection  control decisions, particularly in the presence of clinical signs and  symptoms consistent with COVID-19, or in those who have been in contact with the virus.  Negative results must be combined with clinical observations, patient history, and epidemiological information. The expected result is Negative.  Fact Sheet for Patients: HandmadeRecipes.com.cy  Fact Sheet for Healthcare Providers: FuneralLife.at  This test is not yet approved or cleared by the [place] FDA and  has been authorized for detection and/or diagnosis of SARS-CoV-2 by FDA under an Emergency Use Authorization (EUA).  This EUA will remain in effect (meaning this test can be used) for the duration of  the COV                          ID-19  declaration under Section 564(b)(1) of the Act, 21 U.S.C. section 360bbb-3(b)(1), unless the authorization is terminated or revoked sooner.    Admission on 01/04/2022, Discharged on 01/04/2022  Component Date Value Ref Range Status   Sodium 01/04/2022 141  135 - 145 mmol/L Final   Potassium 01/04/2022 3.8  3.5 - 5.1 mmol/L Final   Chloride 01/04/2022 107  98 - 111 mmol/L Final   CO2 01/04/2022 28  22 - 32 mmol/L Final   Glucose, Bld 01/04/2022 75  70 - 99 mg/dL Final   Glucose reference range applies only to samples taken after fasting for at least 8 hours.   BUN 01/04/2022 15  6 - 20 mg/dL Final   Creatinine, Ser 01/04/2022 0.77  0.61 - 1.24 mg/dL Final   Calcium 01/04/2022 9.0  8.9 - 10.3 mg/dL Final   Total Protein 01/04/2022 6.2 (L)  6.5 - 8.1 g/dL Final   Albumin 01/04/2022 3.8  3.5 - 5.0 g/dL Final   AST 01/04/2022 15  15 - 41 U/L Final   ALT 01/04/2022 13  0 - 44 U/L Final   Alkaline Phosphatase 01/04/2022 58  38 - 126 U/L Final   Total Bilirubin 01/04/2022 0.5  0.3 - 1.2 mg/dL Final   GFR, Estimated 01/04/2022 >60  >60 mL/min Final   Comment: (NOTE) Calculated using the CKD-EPI Creatinine Equation (2021)    Anion gap 01/04/2022 6  5 - 15 Final   Performed at [facility], [place] [address], [place], [place] [address]   Alcohol, Ethyl (B) 01/04/2022 <10  <10 mg/dL Final   Comment: (NOTE) Lowest detectable limit for serum alcohol is 10 mg/dL.  For medical purposes only. Performed at [facility], [place] [address] [place], [place] [address]    Salicylate Lvl 17/61/6073 <7.0 (L)  7.0 - 30.0 mg/dL Final   Performed at [place] [address], [place], [place] [address]   Acetaminophen (Tylenol), Serum 01/04/2022 <10 (L)  10 - 30 ug/mL Final   Comment: (NOTE) Therapeutic concentrations vary significantly. A range of 10-30 ug/mL  may be an effective concentration for many patients. However, some  are best treated at  concentrations outside of this range. Acetaminophen concentrations >150 ug/mL at 4 hours after ingestion  and >50  ug/mL at 12 hours after ingestion are often associated with  toxic reactions.  Performed at [facility], [address] [place], [place] [address]    WBC 01/04/2022 6.2  4.0 - 10.5 K/uL Final   RBC 01/04/2022 4.35  4.22 - 5.81 MIL/uL Final   Hemoglobin 01/04/2022 13.0  13.0 - 17.0 g/dL Final   HCT 52/84/1324 40.0  39.0 - 52.0 % Final   MCV 01/04/2022 92.0  80.0 - 100.0 fL Final   MCH 01/04/2022 29.9  26.0 - 34.0 pg Final   MCHC 01/04/2022 32.5  30.0 - 36.0 g/dL Final   RDW 40/01/2724 13.8  11.5 - 15.5 % Final   Platelets 01/04/2022 204  150 - 400 K/uL Final   nRBC 01/04/2022 0.0  0.0 - 0.2 % Final   Performed at [facility], [address] [place], [place] [address]   Opiates 01/04/2022 NONE DETECTED  NONE DETECTED Final   Cocaine 01/04/2022 NONE DETECTED  NONE DETECTED Final   Benzodiazepines 01/04/2022 NONE DETECTED  NONE DETECTED Final   Amphetamines 01/04/2022 NONE DETECTED  NONE DETECTED Final   Tetrahydrocannabinol 01/04/2022 NONE DETECTED  NONE DETECTED Final   Barbiturates 01/04/2022 NONE DETECTED  NONE DETECTED Final   Comment: (NOTE) DRUG SCREEN FOR MEDICAL PURPOSES ONLY.  IF CONFIRMATION IS NEEDED FOR ANY PURPOSE, NOTIFY LAB WITHIN 5 DAYS.  LOWEST DETECTABLE LIMITS FOR URINE DRUG SCREEN Drug Class                     Cutoff (ng/mL) Amphetamine and metabolites    1000 Barbiturate and metabolites    200 Benzodiazepine                 200 Tricyclics and metabolites     300 Opiates and metabolites        300 Cocaine and metabolites        300 THC                            50 Performed at [facility], [address], [place], [place] [address]   Admission on 12/29/2021, Discharged on 12/30/2021  Component Date Value Ref Range Status   WBC 12/29/2021 8.6  4.0 - 10.5 K/uL Final   RBC  12/29/2021 4.09 (L)  4.22 - 5.81 MIL/uL Final   Hemoglobin 12/29/2021 12.2 (L)  13.0 - 17.0 g/dL Final   HCT 25/95/6387 38.0 (L)  39.0 - 52.0 % Final   MCV 12/29/2021 92.9  80.0 - 100.0 fL Final   MCH 12/29/2021 29.8  26.0 - 34.0 pg Final   MCHC 12/29/2021 32.1  30.0 - 36.0 g/dL Final   RDW 56/43/3295 14.5  11.5 - 15.5 % Final   Platelets 12/29/2021 208  150 - 400 K/uL Final   nRBC 12/29/2021 0.0  0.0 - 0.2 % Final   Neutrophils Relative % 12/29/2021 68  % Final   Neutro Abs 12/29/2021 5.8  1.7 - 7.7 K/uL Final   Lymphocytes Relative 12/29/2021 22  % Final   Lymphs Abs 12/29/2021 1.9  0.7 - 4.0 K/uL Final   Monocytes Relative 12/29/2021 7  % Final   Monocytes Absolute 12/29/2021 0.6  0.1 - 1.0 K/uL Final   Eosinophils Relative 12/29/2021 2  % Final   Eosinophils Absolute 12/29/2021 0.2  0.0 - 0.5 K/uL Final   Basophils Relative 12/29/2021 1  % Final   Basophils Absolute 12/29/2021  0.0  0.0 - 0.1 K/uL Final   Immature Granulocytes 12/29/2021 0  % Final   Abs Immature Granulocytes 12/29/2021 0.03  0.00 - 0.07 K/uL Final   Performed at [facility], [address] [place], [place] [address]   Sodium 12/29/2021 139  135 - 145 mmol/L Final   Potassium 12/29/2021 4.0  3.5 - 5.1 mmol/L Final   Chloride 12/29/2021 106  98 - 111 mmol/L Final   CO2 12/29/2021 25  22 - 32 mmol/L Final   Glucose, Bld 12/29/2021 106 (H)  70 - 99 mg/dL Final   Glucose reference range applies only to samples taken after fasting for at least 8 hours.   BUN 12/29/2021 20  6 - 20 mg/dL Final   Creatinine, Ser 12/29/2021 0.87  0.61 - 1.24 mg/dL Final   Calcium 60/45/4098 9.3  8.9 - 10.3 mg/dL Final   Total Protein 11/91/4782 5.8 (L)  6.5 - 8.1 g/dL Final   Albumin 95/62/1308 3.4 (L)  3.5 - 5.0 g/dL Final   AST 65/78/4696 13 (L)  15 - 41 U/L Final   ALT 12/29/2021 12  0 - 44 U/L Final   Alkaline Phosphatase 12/29/2021 52  38 - 126 U/L Final   Total Bilirubin 12/29/2021 0.4  0.3 - 1.2 mg/dL Final    GFR, Estimated 12/29/2021 >60  >60 mL/min Final   Comment: (NOTE) Calculated using the CKD-EPI Creatinine Equation (2021)    Anion gap 12/29/2021 8  5 - 15 Final   Performed at [facility], [address], [place], [place] [address]   Alcohol, Ethyl (B) 12/29/2021 <10  <10 mg/dL Final   Comment: (NOTE) Lowest detectable limit for serum alcohol is 10 mg/dL.  For medical purposes only. Performed at [facility], [address], [place], [place] [address]    Opiates 12/29/2021 NONE DETECTED  NONE DETECTED Final   Cocaine 12/29/2021 NONE DETECTED  NONE DETECTED Final   Benzodiazepines 12/29/2021 NONE DETECTED  NONE DETECTED Final   Amphetamines 12/29/2021 NONE DETECTED  NONE DETECTED Final   Tetrahydrocannabinol 12/29/2021 NONE DETECTED  NONE DETECTED Final   Barbiturates 12/29/2021 NONE DETECTED  NONE DETECTED Final   Comment: (NOTE) DRUG SCREEN FOR MEDICAL PURPOSES ONLY.  IF CONFIRMATION IS NEEDED FOR ANY PURPOSE, NOTIFY LAB WITHIN 5 DAYS.  LOWEST DETECTABLE LIMITS FOR URINE DRUG SCREEN Drug Class                     Cutoff (ng/mL) Amphetamine and metabolites    1000 Barbiturate and metabolites    200 Benzodiazepine                 200 Tricyclics and metabolites     300 Opiates and metabolites        300 Cocaine and metabolites        300 THC                            50 Performed at [facility], [address] [place], [place] [address]    Salicylate Lvl 12/29/2021 <7.0 (L)  7.0 - 30.0 mg/dL Final   Performed at [facility], [address], [place], [place] [address]   Acetaminophen (Tylenol), Serum 12/29/2021 <10 (L)  10 - 30 ug/mL Final   Comment: (NOTE) Therapeutic concentrations vary significantly. A range of 10-30 ug/mL  may be an effective concentration for many patients.  However, some  are best treated at concentrations outside of this range. Acetaminophen concentrations >150 ug/mL  at 4 hours after ingestion  and >50 ug/mL at 12 hours after ingestion are often associated with  toxic reactions.  Performed at [facility], [address], [place], [place] [address]   Admission on 12/21/2021, Discharged on 12/22/2021  Component Date Value Ref Range Status   Acetaminophen (Tylenol), Serum 12/21/2021 <10 (L)  10 - 30 ug/mL Final   Comment: (NOTE) Therapeutic concentrations vary significantly. A range of 10-30 ug/mL  may be an effective concentration for many patients. However, some  are best treated at concentrations outside of this range. Acetaminophen concentrations >150 ug/mL at 4 hours after ingestion  and >50 ug/mL at 12 hours after ingestion are often associated with  toxic reactions.  Performed at [facility], [address] [place], [place] [address]    Salicylate Lvl 12/21/2021 <7.0 (L)  7.0 - 30.0 mg/dL Final   Performed at [facility], [address], [place], [place] [address]   Alcohol, Ethyl (B) 12/21/2021 <10  <10 mg/dL Final   Comment: (NOTE) Lowest detectable limit for serum alcohol is 10 mg/dL.  For medical purposes only. Performed at [facility], [address], [place], [place] [address]    Opiates 12/21/2021 NONE DETECTED  NONE DETECTED Final   Cocaine 12/21/2021 NONE DETECTED  NONE DETECTED Final   Benzodiazepines 12/21/2021 NONE DETECTED  NONE DETECTED Final   Amphetamines 12/21/2021 NONE DETECTED  NONE DETECTED Final   Tetrahydrocannabinol 12/21/2021 NONE DETECTED  NONE DETECTED Final   Barbiturates 12/21/2021 NONE DETECTED  NONE DETECTED Final   Comment: (NOTE) DRUG SCREEN FOR MEDICAL PURPOSES ONLY.  IF CONFIRMATION IS NEEDED FOR ANY PURPOSE, NOTIFY LAB WITHIN 5 DAYS.  LOWEST DETECTABLE LIMITS FOR URINE DRUG SCREEN Drug Class                     Cutoff (ng/mL) Amphetamine and metabolites    1000 Barbiturate and metabolites    200 Benzodiazepine                 200 Tricyclics and  metabolites     300 Opiates and metabolites        300 Cocaine and metabolites        300 THC                            50 Performed at [facility], [address] [place], [place] [address]    SARS Coronavirus 2 by RT PCR 12/21/2021 NEGATIVE  NEGATIVE Final   Comment: (NOTE) SARS-CoV-2 target nucleic acids are NOT DETECTED.  The SARS-CoV-2 RNA is generally detectable in upper respiratory specimens during the acute phase of infection. The lowest concentration of SARS-CoV-2 viral copies this assay can detect is 138 copies/mL. A negative result does not preclude SARS-Cov-2 infection and should not be used as the sole basis for treatment or other patient management decisions. A negative result may occur with  improper specimen collection/handling, submission of specimen other than nasopharyngeal swab, presence of viral mutation(s) within the areas targeted by this assay, and inadequate number of viral copies(<138 copies/mL). A negative result must be combined with clinical observations, patient history, and epidemiological information. The expected result is Negative.  Fact Sheet for Patients:  BloggerCourse.com  Fact Sheet for Healthcare Providers:  SeriousBroker.it  This test is no  t yet approved or cleared by the [place] and  has been authorized for detection and/or diagnosis of SARS-CoV-2 by FDA under an Emergency Use Authorization (EUA). This EUA will remain  in effect (meaning this test can be used) for the duration of the COVID-19 declaration under Section 564(b)(1) of the Act, 21 U.S.C.section 360bbb-3(b)(1), unless the authorization is terminated  or revoked sooner.       Influenza A by PCR 12/21/2021 NEGATIVE  NEGATIVE Final   Influenza B by PCR 12/21/2021 NEGATIVE  NEGATIVE Final   Comment: (NOTE) The Xpert Xpress SARS-CoV-2/FLU/RSV plus assay is intended as an aid in the  diagnosis of influenza from Nasopharyngeal swab specimens and should not be used as a sole basis for treatment. Nasal washings and aspirates are unacceptable for Xpert Xpress SARS-CoV-2/FLU/RSV testing.  Fact Sheet for Patients: BloggerCourse.com  Fact Sheet for Healthcare Providers: SeriousBroker.it  This test is not yet approved or cleared by the [place] FDA and has been authorized for detection and/or diagnosis of SARS-CoV-2 by FDA under an Emergency Use Authorization (EUA). This EUA will remain in effect (meaning this test can be used) for the duration of the COVID-19 declaration under Section 564(b)(1) of the Act, 21 U.S.C. section 360bbb-3(b)(1), unless the authorization is terminated or revoked.  Performed at [facility], [address], [place], [place] [address]   Admission on 12/21/2021, Discharged on 12/21/2021  Component Date Value Ref Range Status   SARS Coronavirus 2 by RT PCR 12/21/2021 NEGATIVE  NEGATIVE Final   Comment: (NOTE) SARS-CoV-2 target nucleic acids are NOT DETECTED.  The SARS-CoV-2 RNA is generally detectable in upper respiratory specimens during the acute phase of infection. The lowest concentration of SARS-CoV-2 viral copies this assay can detect is 138 copies/mL. A negative result does not preclude SARS-Cov-2 infection and should not be used as the sole basis for treatment or other patient management decisions. A negative result may occur with  improper specimen collection/handling, submission of specimen other than nasopharyngeal swab, presence of viral mutation(s) within the areas targeted by this assay, and inadequate number of viral copies(<138 copies/mL). A negative result must be combined with clinical observations, patient history, and epidemiological information. The expected result is Negative.  Fact Sheet for Patients:  BloggerCourse.com  Fact Sheet  for Healthcare Providers:  SeriousBroker.it  This test is no                          t yet approved or cleared by the [place] FDA and  has been authorized for detection and/or diagnosis of SARS-CoV-2 by FDA under an Emergency Use Authorization (EUA). This EUA will remain  in effect (meaning this test can be used) for the duration of the COVID-19 declaration under Section 564(b)(1) of the Act, 21 U.S.C.section 360bbb-3(b)(1), unless the authorization is terminated  or revoked sooner.       Influenza A by PCR 12/21/2021 NEGATIVE  NEGATIVE Final   Influenza B by PCR 12/21/2021 NEGATIVE  NEGATIVE Final   Comment: (NOTE) The Xpert Xpress SARS-CoV-2/FLU/RSV plus assay is intended as an aid in the diagnosis of influenza from Nasopharyngeal swab specimens and should not be used as a sole basis for treatment. Nasal washings and aspirates are unacceptable for Xpert Xpress SARS-CoV-2/FLU/RSV testing.  Fact Sheet for Patients: BloggerCourse.com  Fact Sheet for Healthcare Providers: SeriousBroker.it  This test is not yet approved or cleared by the [place] and has been authorized for  detection and/or diagnosis of SARS-CoV-2 by FDA under an Emergency Use Authorization (EUA). This EUA will remain in effect (meaning this test can be used) for the duration of the COVID-19 declaration under Section 564(b)(1) of the Act, 21 U.S.C. section 360bbb-3(b)(1), unless the authorization is terminated or revoked.  Performed at [facility], [address] [place], [place] [id number]    WBC 12/21/2021 8.2  4.0 - 10.5 K/uL Final   RBC 12/21/2021 4.26  4.22 - 5.81 MIL/uL Final   Hemoglobin 12/21/2021 12.9 (L)  13.0 - 17.0 g/dL Final   HCT 19/14/782908/22/2023 38.0 (L)  39.0 - 52.0 % Final   MCV 12/21/2021 89.2  80.0 - 100.0 fL Final   MCH 12/21/2021 30.3  26.0 - 34.0 pg Final   MCHC 12/21/2021 33.9  30.0 - 36.0 g/dL  Final   RDW 56/21/308608/22/2023 13.8  11.5 - 15.5 % Final   Platelets 12/21/2021 216  150 - 400 K/uL Final   nRBC 12/21/2021 0.0  0.0 - 0.2 % Final   Neutrophils Relative % 12/21/2021 71  % Final   Neutro Abs 12/21/2021 5.8  1.7 - 7.7 K/uL Final   Lymphocytes Relative 12/21/2021 18  % Final   Lymphs Abs 12/21/2021 1.5  0.7 - 4.0 K/uL Final   Monocytes Relative 12/21/2021 9  % Final   Monocytes Absolute 12/21/2021 0.7  0.1 - 1.0 K/uL Final   Eosinophils Relative 12/21/2021 2  % Final   Eosinophils Absolute 12/21/2021 0.2  0.0 - 0.5 K/uL Final   Basophils Relative 12/21/2021 0  % Final   Basophils Absolute 12/21/2021 0.0  0.0 - 0.1 K/uL Final   Immature Granulocytes 12/21/2021 0  % Final   Abs Immature Granulocytes 12/21/2021 0.03  0.00 - 0.07 K/uL Final   Performed at [facility], [address] [place], [place] [address]   Sodium 12/21/2021 138  135 - 145 mmol/L Final   Potassium 12/21/2021 4.5  3.5 - 5.1 mmol/L Final   Chloride 12/21/2021 107  98 - 111 mmol/L Final   CO2 12/21/2021 26  22 - 32 mmol/L Final   Glucose, Bld 12/21/2021 119 (H)  70 - 99 mg/dL Final   Glucose reference range applies only to samples taken after fasting for at least 8 hours.   BUN 12/21/2021 15  6 - 20 mg/dL Final   Creatinine, Ser 12/21/2021 0.73  0.61 - 1.24 mg/dL Final   Calcium 96/29/528408/22/2023 9.2  8.9 - 10.3 mg/dL Final   Total Protein 13/24/401008/22/2023 5.7 (L)  6.5 - 8.1 g/dL Final   Albumin 27/25/366408/22/2023 3.8  3.5 - 5.0 g/dL Final   AST 40/34/742508/22/2023 19  15 - 41 U/L Final   ALT 12/21/2021 16  0 - 44 U/L Final   Alkaline Phosphatase 12/21/2021 57  38 - 126 U/L Final   Total Bilirubin 12/21/2021 0.3  0.3 - 1.2 mg/dL Final   GFR, Estimated 12/21/2021 >60  >60 mL/min Final   Comment: (NOTE) Calculated using the CKD-EPI Creatinine Equation (2021)    Anion gap 12/21/2021 5  5 - 15 Final   Performed at [facility], [address] [place], [place] [address]   Hgb A1c MFr Bld 12/21/2021 5.0  4.8 - 5.6 % Final    Comment: (NOTE) Pre diabetes:          5.7%-6.4%  Diabetes:              >6.4%  Glycemic control for   <7.0%  adults with diabetes    Mean Plasma Glucose 12/21/2021 96.8  mg/dL Final   Performed at [facility], [address], [place], [place] [address]   Alcohol, Ethyl (B) 12/21/2021 <10  <10 mg/dL Final   Comment: (NOTE) Lowest detectable limit for serum alcohol is 10 mg/dL.  For medical purposes only. Performed at [facility], [address], [place], [place] [address]    TSH 12/21/2021 3.113  0.350 - 4.500 uIU/mL Final   Comment: Performed by a 3rd Generation assay with a functional sensitivity of <=0.01 uIU/mL. Performed at [facility], [address], [place], [place] [address]    POC Amphetamine UR 12/21/2021 None Detected  NONE DETECTED (Cut Off Level 1000 ng/mL) Final   POC Secobarbital (BAR) 12/21/2021 None Detected  NONE DETECTED (Cut Off Level 300 ng/mL) Final   POC Buprenorphine (BUP) 12/21/2021 None Detected  NONE DETECTED (Cut Off Level 10 ng/mL) Final   POC Oxazepam (BZO) 12/21/2021 None Detected  NONE DETECTED (Cut Off Level 300 ng/mL) Final   POC Cocaine UR 12/21/2021 None Detected  NONE DETECTED (Cut Off Level 300 ng/mL) Final   POC Methamphetamine UR 12/21/2021 None Detected  NONE DETECTED (Cut Off Level 1000 ng/mL) Final   POC Morphine 12/21/2021 None Detected  NONE DETECTED (Cut Off Level 300 ng/mL) Final   POC Methadone UR 12/21/2021 None Detected  NONE DETECTED (Cut Off Level 300 ng/mL) Final   POC Oxycodone UR 12/21/2021 None Detected  NONE DETECTED (Cut Off Level 100 ng/mL) Final   POC Marijuana UR 12/21/2021 None Detected  NONE DETECTED (Cut Off Level 50 ng/mL) Final   SARSCOV2ONAVIRUS 2 AG 12/21/2021 NEGATIVE  NEGATIVE Final   Comment: (NOTE) SARS-CoV-2 antigen NOT DETECTED.   Negative results are presumptive.  Negative results do not preclude SARS-CoV-2 infection and should not be used as the sole basis for treatment or other  patient management decisions, including infection  control decisions, particularly in the presence of clinical signs and  symptoms consistent with COVID-19, or in those who have been in contact with the virus.  Negative results must be combined with clinical observations, patient history, and epidemiological information. The expected result is Negative.  Fact Sheet for Patients: https://www.jennings-kim.com/  Fact Sheet for Healthcare Providers: https://alexander-rogers.biz/  This test is not yet approved or cleared by the [place] FDA and  has been authorized for detection and/or diagnosis of SARS-CoV-2 by FDA under an Emergency Use Authorization (EUA).  This EUA will remain in effect (meaning this test can be used) for the duration of  the COV                          ID-19 declaration under Section 564(b)(1) of the Act, 21 U.S.C. section 360bbb-3(b)(1), unless the authorization is terminated or revoked sooner.     Cholesterol 12/21/2021 146  0 - 200 mg/dL Final   Triglycerides 00/93/8182 94  <150 mg/dL Final   HDL 99/37/1696 67  >40 mg/dL Final   Total CHOL/HDL Ratio 12/21/2021 2.2  RATIO Final   VLDL 12/21/2021 19  0 - 40 mg/dL Final   LDL Cholesterol 12/21/2021 60  0 - 99 mg/dL Final   Comment:        Total Cholesterol/HDL:CHD Risk Coronary Heart Disease Risk Table                     Men   Women  1/2 Average Risk  3.4   3.3  Average Risk       5.0   4.4  2 X Average Risk   9.6   7.1  3 X Average Risk  23.4   11.0        Use the calculated Patient Ratio above and the CHD Risk Table to determine the patient's CHD Risk.        ATP III CLASSIFICATION (LDL):  <100     mg/dL   Optimal  829-937  mg/dL   Near or Above                    Optimal  130-159  mg/dL   Borderline  169-678  mg/dL   High  >938     mg/dL   Very High Performed at [facility], [address] [place], [place] [address]   Admission on 08/31/2021, Discharged on  09/01/2021  Component Date Value Ref Range Status   SARS Coronavirus 2 by RT PCR 08/31/2021 NEGATIVE  NEGATIVE Final   Comment: (NOTE) SARS-CoV-2 target nucleic acids are NOT DETECTED.  The SARS-CoV-2 RNA is generally detectable in upper respiratory specimens during the acute phase of infection. The lowest concentration of SARS-CoV-2 viral copies this assay can detect is 138 copies/mL. A negative result does not preclude SARS-Cov-2 infection and should not be used as the sole basis for treatment or other patient management decisions. A negative result may occur with  improper specimen collection/handling, submission of specimen other than nasopharyngeal swab, presence of viral mutation(s) within the areas targeted by this assay, and inadequate number of viral copies(<138 copies/mL). A negative result must be combined with clinical observations, patient history, and epidemiological information. The expected result is Negative.  Fact Sheet for Patients:  BloggerCourse.com  Fact Sheet for Healthcare Providers:  SeriousBroker.it  This test is no                          t yet approved or cleared by the [place] FDA and  has been authorized for detection and/or diagnosis of SARS-CoV-2 by FDA under an Emergency Use Authorization (EUA). This EUA will remain  in effect (meaning this test can be used) for the duration of the COVID-19 declaration under Section 564(b)(1) of the Act, 21 U.S.C.section 360bbb-3(b)(1), unless the authorization is terminated  or revoked sooner.       Influenza A by PCR 08/31/2021 NEGATIVE  NEGATIVE Final   Influenza B by PCR 08/31/2021 NEGATIVE  NEGATIVE Final   Comment: (NOTE) The Xpert Xpress SARS-CoV-2/FLU/RSV plus assay is intended as an aid in the diagnosis of influenza from Nasopharyngeal swab specimens and should not be used as a sole basis for treatment. Nasal washings and aspirates are  unacceptable for Xpert Xpress SARS-CoV-2/FLU/RSV testing.  Fact Sheet for Patients: BloggerCourse.com  Fact Sheet for Healthcare Providers: SeriousBroker.it  This test is not yet approved or cleared by the [place] FDA and has been authorized for detection and/or diagnosis of SARS-CoV-2 by FDA under an Emergency Use Authorization (EUA). This EUA will remain in effect (meaning this test can be used) for the duration of the COVID-19 declaration under Section 564(b)(1) of the Act, 21 U.S.C. section 360bbb-3(b)(1), unless the authorization is terminated or revoked.  Performed at [facility], [address], [place], [place] [address]    WBC 08/31/2021 6.3  4.0 - 10.5 K/uL Final   RBC 08/31/2021 4.32  4.22 - 5.81  MIL/uL Final   Hemoglobin 08/31/2021 13.4  13.0 - 17.0 g/dL Final   HCT 16/01/9603 39.9  39.0 - 52.0 % Final   MCV 08/31/2021 92.4  80.0 - 100.0 fL Final   MCH 08/31/2021 31.0  26.0 - 34.0 pg Final   MCHC 08/31/2021 33.6  30.0 - 36.0 g/dL Final   RDW 54/12/8117 13.6  11.5 - 15.5 % Final   Platelets 08/31/2021 230  150 - 400 K/uL Final   nRBC 08/31/2021 0.0  0.0 - 0.2 % Final   Neutrophils Relative % 08/31/2021 64  % Final   Neutro Abs 08/31/2021 4.0  1.7 - 7.7 K/uL Final   Lymphocytes Relative 08/31/2021 25  % Final   Lymphs Abs 08/31/2021 1.6  0.7 - 4.0 K/uL Final   Monocytes Relative 08/31/2021 9  % Final   Monocytes Absolute 08/31/2021 0.6  0.1 - 1.0 K/uL Final   Eosinophils Relative 08/31/2021 1  % Final   Eosinophils Absolute 08/31/2021 0.1  0.0 - 0.5 K/uL Final   Basophils Relative 08/31/2021 1  % Final   Basophils Absolute 08/31/2021 0.0  0.0 - 0.1 K/uL Final   Immature Granulocytes 08/31/2021 0  % Final   Abs Immature Granulocytes 08/31/2021 0.01  0.00 - 0.07 K/uL Final   Performed at [facility], [address] [place], [place] [address]   Sodium 08/31/2021 140  135 - 145 mmol/L Final    Potassium 08/31/2021 4.7  3.5 - 5.1 mmol/L Final   Chloride 08/31/2021 107  98 - 111 mmol/L Final   CO2 08/31/2021 26  22 - 32 mmol/L Final   Glucose, Bld 08/31/2021 105 (H)  70 - 99 mg/dL Final   Glucose reference range applies only to samples taken after fasting for at least 8 hours.   BUN 08/31/2021 17  6 - 20 mg/dL Final   Creatinine, Ser 08/31/2021 0.73  0.61 - 1.24 mg/dL Final   Calcium 95/62/1308 9.3  8.9 - 10.3 mg/dL Final   Total Protein 65/78/4696 5.7 (L)  6.5 - 8.1 g/dL Final   Albumin 29/52/8413 3.9  3.5 - 5.0 g/dL Final   AST 24/40/1027 20  15 - 41 U/L Final   ALT 08/31/2021 16  0 - 44 U/L Final   Alkaline Phosphatase 08/31/2021 76  38 - 126 U/L Final   Total Bilirubin 08/31/2021 0.7  0.3 - 1.2 mg/dL Final   GFR, Estimated 08/31/2021 >60  >60 mL/min Final   Comment: (NOTE) Calculated using the CKD-EPI Creatinine Equation (2021)    Anion gap 08/31/2021 7  5 - 15 Final   Performed at [facility], [address], [place], [place] [address]   Alcohol, Ethyl (B) 08/31/2021 <10  <10 mg/dL Final   Comment: (NOTE) Lowest detectable limit for serum alcohol is 10 mg/dL.  For medical purposes only. Performed at [facility], [address], [place], [place] [address]    POC Amphetamine UR 08/31/2021 None Detected  NONE DETECTED (Cut Off Level 1000 ng/mL) Preliminary   POC Secobarbital (BAR) 08/31/2021 None Detected  NONE DETECTED (Cut Off Level 300 ng/mL) Preliminary   POC Buprenorphine (BUP) 08/31/2021 None Detected  NONE DETECTED (Cut Off Level 10 ng/mL) Preliminary   POC Oxazepam (BZO) 08/31/2021 None Detected  NONE DETECTED (Cut Off Level 300 ng/mL) Preliminary   POC Cocaine UR 08/31/2021 None Detected  NONE DETECTED (Cut Off Level 300 ng/mL) Preliminary   POC Methamphetamine UR 08/31/2021 None Detected  NONE DETECTED (Cut  Off Level 1000 ng/mL) Preliminary   POC Morphine 08/31/2021 None Detected  NONE DETECTED (Cut Off Level 300 ng/mL) Preliminary   POC Oxycodone  UR 08/31/2021 None Detected  NONE DETECTED (Cut Off Level 100 ng/mL) Preliminary   POC Methadone UR 08/31/2021 None Detected  NONE DETECTED (Cut Off Level 300 ng/mL) Preliminary   POC Marijuana UR 08/31/2021 None Detected  NONE DETECTED (Cut Off Level 50 ng/mL) Preliminary   SARSCOV2ONAVIRUS 2 AG 08/31/2021 NEGATIVE  NEGATIVE Final   Comment: (NOTE) SARS-CoV-2 antigen NOT DETECTED.   Negative results are presumptive.  Negative results do not preclude SARS-CoV-2 infection and should not be used as the sole basis for treatment or other patient management decisions, including infection  control decisions, particularly in the presence of clinical signs and  symptoms consistent with COVID-19, or in those who have been in contact with the virus.  Negative results must be combined with clinical observations, patient history, and epidemiological information. The expected result is Negative.  Fact Sheet for Patients: https://www.jennings-kim.com/  Fact Sheet for Healthcare Providers: https://alexander-rogers.biz/  This test is not yet approved or cleared by the [place] FDA and  has been authorized for detection and/or diagnosis of SARS-CoV-2 by FDA under an Emergency Use Authorization (EUA).  This EUA will remain in effect (meaning this test can be used) for the duration of  the COV                          ID-19 declaration under Section 564(b)(1) of the Act, 21 U.S.C. section 360bbb-3(b)(1), unless the authorization is terminated or revoked sooner.    Admission on 08/29/2021, Discharged on 08/29/2021  Component Date Value Ref Range Status   Glucose-Capillary 08/29/2021 100 (H)  70 - 99 mg/dL Final   Glucose reference range applies only to samples taken after fasting for at least 8 hours.  Admission on 08/27/2021, Discharged on 08/28/2021  Component Date Value Ref Range Status   Sodium 08/27/2021 136  135 - 145 mmol/L Final   Potassium 08/27/2021 4.2  3.5 -  5.1 mmol/L Final   Chloride 08/27/2021 104  98 - 111 mmol/L Final   CO2 08/27/2021 25  22 - 32 mmol/L Final   Glucose, Bld 08/27/2021 97  70 - 99 mg/dL Final   Glucose reference range applies only to samples taken after fasting for at least 8 hours.   BUN 08/27/2021 14  6 - 20 mg/dL Final   Creatinine, Ser 08/27/2021 0.59 (L)  0.61 - 1.24 mg/dL Final   Calcium 16/01/9603 9.5  8.9 - 10.3 mg/dL Final   Total Protein 54/12/8117 6.9  6.5 - 8.1 g/dL Final   Albumin 14/78/2956 4.2  3.5 - 5.0 g/dL Final   AST 21/30/8657 16  15 - 41 U/L Final   ALT 08/27/2021 14  0 - 44 U/L Final   Alkaline Phosphatase 08/27/2021 74  38 - 126 U/L Final   Total Bilirubin 08/27/2021 0.5  0.3 - 1.2 mg/dL Final   GFR, Estimated 08/27/2021 >60  >60 mL/min Final   Comment: (NOTE) Calculated using the CKD-EPI Creatinine Equation (2021)    Anion gap 08/27/2021 7  5 - 15 Final   Performed at [facility], [address], [place], [place] [address]   WBC 08/27/2021 6.5  4.0 - 10.5 K/uL Final   RBC 08/27/2021 4.49  4.22 - 5.81 MIL/uL Final   Hemoglobin 08/27/2021 14.1  13.0 - 17.0 g/dL Final  HCT 08/27/2021 41.2  39.0 - 52.0 % Final   MCV 08/27/2021 91.8  80.0 - 100.0 fL Final   MCH 08/27/2021 31.4  26.0 - 34.0 pg Final   MCHC 08/27/2021 34.2  30.0 - 36.0 g/dL Final   RDW 16/01/9603 14.1  11.5 - 15.5 % Final   Platelets 08/27/2021 218  150 - 400 K/uL Final   nRBC 08/27/2021 0.0  0.0 - 0.2 % Final   Performed at [facility], [address] [place], [place] [address]   Glucose-Capillary 08/27/2021 116 (H)  70 - 99 mg/dL Final   Glucose reference range applies only to samples taken after fasting for at least 8 hours.   Acetaminophen (Tylenol), Serum 08/27/2021 <10 (L)  10 - 30 ug/mL Final   Comment: (NOTE) Therapeutic concentrations vary significantly. A range of 10-30 ug/mL  may be an effective concentration for many patients. However, some  are best treated at concentrations  outside of this range. Acetaminophen concentrations >150 ug/mL at 4 hours after ingestion  and >50 ug/mL at 12 hours after ingestion are often associated with  toxic reactions.  Performed at [facility], [address] [place], [place] [address]    Opiates 08/27/2021 NONE DETECTED  NONE DETECTED Final   Cocaine 08/27/2021 NONE DETECTED  NONE DETECTED Final   Benzodiazepines 08/27/2021 NONE DETECTED  NONE DETECTED Final   Amphetamines 08/27/2021 NONE DETECTED  NONE DETECTED Final   Tetrahydrocannabinol 08/27/2021 NONE DETECTED  NONE DETECTED Final   Barbiturates 08/27/2021 NONE DETECTED  NONE DETECTED Final   Comment: (NOTE) DRUG SCREEN FOR MEDICAL PURPOSES ONLY.  IF CONFIRMATION IS NEEDED FOR ANY PURPOSE, NOTIFY LAB WITHIN 5 DAYS.  LOWEST DETECTABLE LIMITS FOR URINE DRUG SCREEN Drug Class                     Cutoff (ng/mL) Amphetamine and metabolites    1000 Barbiturate and metabolites    200 Benzodiazepine                 200 Tricyclics and metabolites     300 Opiates and metabolites        300 Cocaine and metabolites        300 THC                            50 Performed at [facility], [address], [place], [place] [address]    Salicylate Lvl 08/27/2021 <7.0 (L)  7.0 - 30.0 mg/dL Final   Performed at [facility], [address], [place], [place] [address]   Alcohol, Ethyl (B) 08/27/2021 <10  <10 mg/dL Final   Comment: (NOTE) Lowest detectable limit for serum alcohol is 10 mg/dL.  For medical purposes only. Performed at [facility], [address], [place], [place] [address]    SARS Coronavirus 2 by RT PCR 08/27/2021 NEGATIVE  NEGATIVE Final   Comment: (NOTE) SARS-CoV-2 target nucleic acids are NOT DETECTED.  The SARS-CoV-2 RNA is generally detectable in upper respiratory specimens during the acute phase of infection. The lowest concentration of SARS-CoV-2 viral copies this assay can  detect is 138 copies/mL. A negative result does not preclude SARS-Cov-2 infection and should not be used as the sole basis for treatment or other patient management decisions. A negative result may occur with  improper specimen collection/handling, submission of specimen other than nasopharyngeal swab, presence of viral mutation(s)  within the areas targeted by this assay, and inadequate number of viral copies(<138 copies/mL). A negative result must be combined with clinical observations, patient history, and epidemiological information. The expected result is Negative.  Fact Sheet for Patients:  BloggerCourse.com  Fact Sheet for Healthcare Providers:  SeriousBroker.it  This test is no                          t yet approved or cleared by the [place] FDA and  has been authorized for detection and/or diagnosis of SARS-CoV-2 by FDA under an Emergency Use Authorization (EUA). This EUA will remain  in effect (meaning this test can be used) for the duration of the COVID-19 declaration under Section 564(b)(1) of the Act, 21 U.S.C.section 360bbb-3(b)(1), unless the authorization is terminated  or revoked sooner.       Influenza A by PCR 08/27/2021 NEGATIVE  NEGATIVE Final   Influenza B by PCR 08/27/2021 NEGATIVE  NEGATIVE Final   Comment: (NOTE) The Xpert Xpress SARS-CoV-2/FLU/RSV plus assay is intended as an aid in the diagnosis of influenza from Nasopharyngeal swab specimens and should not be used as a sole basis for treatment. Nasal washings and aspirates are unacceptable for Xpert Xpress SARS-CoV-2/FLU/RSV testing.  Fact Sheet for Patients: BloggerCourse.com  Fact Sheet for Healthcare Providers: SeriousBroker.it  This test is not yet approved or cleared by the [place] FDA and has been authorized for detection and/or diagnosis of SARS-CoV-2 by FDA under an Emergency  Use Authorization (EUA). This EUA will remain in effect (meaning this test can be used) for the duration of the COVID-19 declaration under Section 564(b)(1) of the Act, 21 U.S.C. section 360bbb-3(b)(1), unless the authorization is terminated or revoked.  Performed at [facility], [address], [place], [place] [address]   Admission on 08/18/2021, Discharged on 08/19/2021  Component Date Value Ref Range Status   SARS Coronavirus 2 by RT PCR 08/18/2021 NEGATIVE  NEGATIVE Final   Comment: (NOTE) SARS-CoV-2 target nucleic acids are NOT DETECTED.  The SARS-CoV-2 RNA is generally detectable in upper respiratory specimens during the acute phase of infection. The lowest concentration of SARS-CoV-2 viral copies this assay can detect is 138 copies/mL. A negative result does not preclude SARS-Cov-2 infection and should not be used as the sole basis for treatment or other patient management decisions. A negative result may occur with  improper specimen collection/handling, submission of specimen other than nasopharyngeal swab, presence of viral mutation(s) within the areas targeted by this assay, and inadequate number of viral copies(<138 copies/mL). A negative result must be combined with clinical observations, patient history, and epidemiological information. The expected result is Negative.  Fact Sheet for Patients:  BloggerCourse.com  Fact Sheet for Healthcare Providers:  SeriousBroker.it  This test is no                          t yet approved or cleared by the [place] FDA and  has been authorized for detection and/or diagnosis of SARS-CoV-2 by FDA under an Emergency Use Authorization (EUA). This EUA will remain  in effect (meaning this test can be used) for the duration of the COVID-19 declaration under Section 564(b)(1) of the Act, 21 U.S.C.section 360bbb-3(b)(1), unless the authorization is terminated   or revoked sooner.       Influenza A by PCR 08/18/2021 NEGATIVE  NEGATIVE Final   Influenza B by PCR 08/18/2021 NEGATIVE  NEGATIVE Final  Comment: (NOTE) The Xpert Xpress SARS-CoV-2/FLU/RSV plus assay is intended as an aid in the diagnosis of influenza from Nasopharyngeal swab specimens and should not be used as a sole basis for treatment. Nasal washings and aspirates are unacceptable for Xpert Xpress SARS-CoV-2/FLU/RSV testing.  Fact Sheet for Patients: BloggerCourse.com  Fact Sheet for Healthcare Providers: SeriousBroker.it  This test is not yet approved or cleared by the [place] FDA and has been authorized for detection and/or diagnosis of SARS-CoV-2 by FDA under an Emergency Use Authorization (EUA). This EUA will remain in effect (meaning this test can be used) for the duration of the COVID-19 declaration under Section 564(b)(1) of the Act, 21 U.S.C. section 360bbb-3(b)(1), unless the authorization is terminated or revoked.  Performed at [facility], [address] [place], [place] [address]     Allergies: Risperdal [risperidone]  PTA Medications: (Not in a hospital admission)   Medical Decision Making  Patient currently under IVC for safety concerns.  He will be admitted to [facility] for continuous assessment for safety monitoring with follow-up by psychiatry on 01-22-2022. Lab Orders         Resp Panel by RT-PCR (Flu A&B, Covid) Anterior Nasal Swab         CBC with Differential/Platelet         Comprehensive metabolic panel         Hemoglobin A1c         Ethanol         Lipid panel         TSH         POCT Urine Drug Screen - (I-Screen)         POC SARS Coronavirus 2 Ag         Recommendations  Based on my evaluation the patient does not appear to have an emergency medical condition.  Maricela Bo, NP 01/22/22  1:11 AM

## 2022-01-22 NOTE — ED Notes (Signed)
EMS at facility -  waiting on [employer]

## 2022-01-22 NOTE — BH Assessment (Signed)
Comprehensive Clinical Assessment (CCA) Note  01/22/2022 JESSEJAMES SHIMP [id number]  Disposition: Leandro Reasoner, NP recommends pt to be admitted to [facility] for Continuous Assessment.   [place] ED from 01/21/2022 in [facility] Most recent reading at 01/21/2022 11:24 PM ED from 01/21/2022 in [place] Most recent reading at 01/21/2022 11:16 AM ED from 01/07/2022 in [facility] Most recent reading at 01/07/2022  3:05 PM  C-SSRS RISK CATEGORY High Risk Error: Q3, 4, or 5 should not be populated when Q2 is No Error: Question 6 not populated      The patient demonstrates the following risk factors for suicide: Chronic risk factors for suicide include: psychiatric disorder of Major depressive disorder, recurrent severe without psychotic features ([place]) . Acute risk factors for suicide include:  UTA . Protective factors for this patient include:  UTA . Considering these factors, the overall suicide risk at this point appears to be high. Patient is not appropriate for outpatient follow up.  Einar Grad. Buren is a 51 year old male who presents involuntary and unaccompanied to [facility]. Clinician asked the pt, "what brought you to the hospital?" Pt reports, he's been homeless for years and months. Pt reports, "I get social security Medicaid and Medicare." Pt reports, "I'm dirty, hungry, I need medications to feel better. Pt denies, SI, HI, AVH. Pt refused to discuss SI.   Pt was IVC'd by [employer]. Per IVC paperwork: "Respondent was in parking lot at [employer]. Respondent was wearing a hospital bracelet. Refused to give any information to [facility]. Respondent did not want to go to the hospital, and would not tell LEO what hospital he came from. Respondent made multiple statements about wanting to cut his genitals off and punching himself in the face. Respondent was exposing himself, cursing and making derogatory statements  regarding those of another race. Respondent may have Tourette Syndrome. Respondent is a danger to himself and others."   Pt denies, substance use. Pt denies, taking medications but was diagnosed with "Manic Depression."   Pt was a very poor historian and answered few questions. Pt mostly discuss that he wanted to take a shower and to eat. Pt's mood was irritable. Pt's affect was flat. Pt's insight, and judgement was poor.   Diagnosis: Major Depressive Disorder, recurrent severe without psychotic features ([place]).  *Pt reports, his brother and girlfriend are supports. NP asked the pt if she can have his brother's phone number to gather additional information. Pt then reports his brother was dead.*   Chief Complaint:  Chief Complaint  Patient presents with   IVC    Agitation   Visit Diagnosis:     CCA Screening, Triage and Referral (STR)  Patient Reported Information How did you hear about [place]? Legal System  What Is the Reason for Your Visit/Call Today? Pt presents to [facility] voluntarily, accompanied by [employer] at this time. Pt entered the building agitated and yelling, cursing and being disrespectful to registration staff. Pt did allow this writer to get vitals. Per [employer], pt was wandering on the streets and he was punching himself in the face. Pt was at [facility] ED earlier today. Pt refused to complete triage process at this time. Per [employer], IVC paperwork has been initiated.  How Long Has This Been Causing You Problems? -- Pincus Badder)  What Do You Feel Would Help You the Most Today? -- Pincus Badder)   Have You Recently Had Any Thoughts About Hurting Yourself? -- Pincus Badder)  Are You Planning to Commit Suicide/Harm Yourself At This time? -- Pincus Badder)   Have you Recently Had Thoughts About [place]? -- Pincus Badder)  Are You Planning to Harm Someone at This Time? -- Pincus Badder)  Explanation: No data recorded  Have You Used Any Alcohol or Drugs in the Past 24 Hours? -- Pincus Badder)  How Long Ago Did You Use Drugs or  Alcohol? No data recorded What Did You Use and How Much? 1 beer   Do You Currently Have a Therapist/Psychiatrist? No  Name of Therapist/Psychiatrist: No data recorded  Have You Been Recently Discharged From Any Office Practice or Programs? No  Explanation of Discharge From Practice/Program: Discharged from [facility] earlier today.     CCA Screening Triage Referral Assessment Type of Contact: Face-to-Face  Telemedicine Service Delivery:   Is this Initial or Reassessment? Initial Assessment  Date Telepsych consult ordered in [facility]:  02/24/21  Time Telepsych consult ordered in [facility]:  2112  Location of Assessment: [facility] Assessment Services  Provider Location: [facility] Assessment Services   Collateral Involvement: [employer] who is present   Does Patient Have a Stage manager Guardian? No  Legal Guardian Contact Information: No data recorded Copy of Legal Guardianship Form: No data recorded Legal Guardian Notified of Arrival: No data recorded Legal Guardian Notified of Pending Discharge: No data recorded If Minor and Not Living with Parent(s), Who has Custody? NA  Is CPS involved or ever been involved? Never  Is APS involved or ever been involved? Never   Patient Determined To Be At Risk for Harm To Self or Others Based on Review of Patient Reported Information or Presenting Complaint? Yes, for Self-Harm  Method: No data recorded Availability of Means: No data recorded Intent: No data recorded Notification Required: No data recorded Additional Information for Danger to Others Potential: No data recorded Additional Comments for Danger to Others Potential: No data recorded Are There Guns or Other Weapons in Your Home? No data recorded Types of Guns/Weapons: No data recorded Are These Weapons Safely Secured?                            No data recorded Who Could Verify You Are Able To Have These Secured: No data recorded Do You Have any Outstanding Charges, Pending Court Dates,  Parole/Probation? No data recorded Contacted To Inform of Risk of Harm To Self or Others: Other: Comment (NA)    Does Patient Present under Involuntary Commitment? No  IVC Papers Initial File Date: No data recorded  [place] of Residence: [place] (Homeless in [place])   Patient Currently Receiving the Following Services: Not Receiving Services   Determination of Need: Emergent (2 hours)   Options For Referral: Medication Management; Outpatient Therapy; Other: Comment     CCA Biopsychosocial Patient Reported Schizophrenia/Schizoaffective Diagnosis in Past: Yes   Strengths: pt is willing to participate in treatment   Mental Health Symptoms Depression:   Irritability   Duration of Depressive symptoms:    Mania:   -- (UTA)   Anxiety:    Worrying   Psychosis:   None   Duration of Psychotic symptoms:    Trauma:   -- (UTA)   Obsessions:   -- (UTA)   Compulsions:   -- (UTA)   Inattention:   -- (UTA)   Hyperactivity/Impulsivity:   -- (UTA)   Oppositional/Defiant Behaviors:   Angry; Argumentative; Temper   Emotional Irregularity:   Chronic feelings of emptiness  Other Mood/Personality Symptoms:   NA    Mental Status Exam Appearance and self-care  Stature:   Average   Weight:   Thin   Clothing:   Disheveled   Grooming:   Neglected   Cosmetic use:   None   Posture/gait:   Normal   Motor activity:   Not Remarkable   Sensorium  Attention:   Normal   Concentration:   Normal   Orientation:   Person   Recall/memory:   Defective in Immediate   Affect and Mood  Affect:   Appropriate   Mood:   Anxious; Depressed   Relating  Eye contact:   Normal   Facial expression:   Anxious   Attitude toward examiner:   Guarded   Thought and Language  Speech flow:  Normal   Thought content:   Persecutions   Preoccupation:   None   Hallucinations:   None   Organization:  No data recorded  [employer] of Knowledge:   Poor   Intelligence:   Average   Abstraction:   -- (UTA)   Judgement:   Poor   Reality Testing:   -- (UTA)   Insight:   Poor   Decision Making:   -- Industrial/product designer)   Social Functioning  Social Maturity:   Impulsive   Social Judgement:   "Street Smart"   Stress  Stressors:   Housing   Coping Ability:   Overwhelmed; Deficient supports   Skill Deficits:   Self-care   Supports:   Family     Religion: Religion/Spirituality Are You A Religious Person?:  Industrial/product designer)  Leisure/Recreation: Leisure / Recreation Do You Have Hobbies?:  (UTA)  Exercise/Diet: Exercise/Diet Do You Exercise?:  (UTA) Do You Follow a Special Diet?:  (UTA) Do You Have Any Trouble Sleeping?:  (UTA)   CCA Employment/Education Employment/Work Situation: Employment / Work Situation Employment Situation: On disability (Per chart.) Why is Patient on Disability: Mental and physical issues per notes (Per chart.) How Long has Patient Been on Disability: "Since I was 17" (Per chart.) Patient's Job has Been Impacted by Current Illness: No Has Patient ever Been in the [employer]?: No (Per chart.)  Education: Education Last Grade Completed: 10 (Per chart.)   CCA Family/Childhood History Family and Relationship History: Family history Marital status: Single Does patient have children?:  (UTA)  Childhood History:  Childhood History By whom was/is the patient raised?:  (UTA) Did patient suffer any verbal/emotional/physical/sexual abuse as a child?:  (UTA) Did patient suffer from severe childhood neglect?:  (UTA) Has patient ever been sexually abused/assaulted/raped as an adolescent or adult?:  (UTA) Was the patient ever a victim of a crime or a disaster?:  (UTA) Witnessed domestic violence?:  (UTA)  Child/Adolescent Assessment:     CCA Substance Use Alcohol/Drug Use: Alcohol / Drug Use Pain Medications: See MAR Prescriptions: See MAR Over the Counter: See MAR History  of alcohol / drug use?: No history of alcohol / drug abuse (Pt denies.)    ASAM's:  Six Dimensions of Multidimensional Assessment  Dimension 1:  Acute Intoxication and/or Withdrawal Potential:      Dimension 2:  Biomedical Conditions and Complications:      Dimension 3:  Emotional, Behavioral, or Cognitive Conditions and Complications:     Dimension 4:  Readiness to Change:     Dimension 5:  Relapse, Continued use, or Continued Problem Potential:     Dimension 6:  Recovery/Living Environment:     ASAM Severity Score:  ASAM Recommended Level of Treatment:     Substance use Disorder (SUD)    Recommendations for Services/Supports/Treatments: Recommendations for Services/Supports/Treatments Recommendations For Services/Supports/Treatments: Other (Comment) (Pt to be admitted to 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   Major depressive disorder, recurrent severe without psychotic features ([place]) 08/27/2021   Adjustment disorder 02/25/2021   Homelessness 02/25/2021   Major depressive disorder 02/25/2021     Referrals to Alternative Service(s): Referred to Alternative Service(s):   Place:   Date:   Time:    Referred to Alternative Service(s):   Place:   Date:   Time:    Referred to Alternative Service(s):   Place:   Date:   Time:    Referred to Alternative Service(s):   Place:   Date:   Time:     Vertell Novak, [facility] Comprehensive Clinical Assessment (CCA) Screening, Triage and Referral Note  01/22/2022 ERIOLUWA BEDELL [id number]  Chief Complaint:  Chief Complaint  Patient presents with   IVC    Agitation   Visit Diagnosis:   Patient Reported Information How did you hear about [place]? Legal System  What Is the Reason for Your Visit/Call Today? Pt presents to [facility] voluntarily, accompanied by [employer] at  this time. Pt entered the building agitated and yelling, cursing and being disrespectful to registration staff. Pt did allow this writer to get vitals. Per [employer], pt was wandering on the streets and he was punching himself in the face. Pt was at [facility] ED earlier today. Pt refused to complete triage process at this time. Per [employer], IVC paperwork has been initiated.  How Long Has This Been Causing You Problems? -- Pincus Badder)  What Do You Feel Would Help You the Most Today? -- (uta)   Have You Recently Had Any Thoughts About Hurting Yourself? -- Pincus Badder)  Are You Planning to [place] At This time? -- Pincus Badder)   Have you Recently Had Thoughts About [place]? -- Pincus Badder)  Are You Planning to Harm Someone at This Time? -- Pincus Badder)  Explanation: No data recorded  Have You Used Any Alcohol or Drugs in the Past 24 Hours? -- Pincus Badder)  How Long Ago Did You Use Drugs or Alcohol? No data recorded What Did You Use and How Much? 1 beer   Do You Currently Have a Therapist/Psychiatrist? No  Name of Therapist/Psychiatrist: No data recorded  Have You Been Recently Discharged From Any Office Practice or Programs? No  Explanation of Discharge From Practice/Program: Discharged from [facility] earlier today.    CCA Screening Triage Referral Assessment Type of Contact: Face-to-Face  Telemedicine Service Delivery:   Is this Initial or Reassessment? Initial Assessment  Date Telepsych consult ordered in [facility]:  02/24/21  Time Telepsych consult ordered in [facility]:  2112  Location of Assessment: [facility] Assessment Services  Provider Location: [facility] Assessment Services   Collateral Involvement: [employer] who is present   Does Patient Have a Stage manager Guardian? No data recorded Name and Contact of Legal Guardian: No data recorded If Minor and Not Living with Parent(s), Who has Custody? NA  Is CPS involved or ever been involved? Never  Is APS involved or ever been involved?  Never   Patient Determined To Be At Risk for Harm To Self or Others Based on Review of Patient Reported Information or Presenting Complaint? Yes,  for Self-Harm  Method: No data recorded Availability of Means: No data recorded Intent: No data recorded Notification Required: No data recorded Additional Information for Danger to Others Potential: No data recorded Additional Comments for Danger to Others Potential: No data recorded Are There Guns or Other Weapons in Your Home? No data recorded Types of Guns/Weapons: No data recorded Are These Weapons Safely Secured?                            No data recorded Who Could Verify You Are Able To Have These Secured: No data recorded Do You Have any Outstanding Charges, Pending Court Dates, Parole/Probation? No data recorded Contacted To Inform of Risk of Harm To Self or Others: Other: Comment (NA)   Does Patient Present under Involuntary Commitment? No  IVC Papers Initial File Date: No data recorded  [place] of Residence: [place] (Homeless in [place])   Patient Currently Receiving the Following Services: Not Receiving Services   Determination of Need: Emergent (2 hours)   Options For Referral: Medication Management; Outpatient Therapy; Other: Comment   Discharge Disposition:     Vertell Novak, [place], [place], [facility], [facility] Triage Specialist [phone]

## 2022-01-22 NOTE — Discharge Instructions (Addendum)
Take all medications as prescribed. Keep all follow-up appointments as scheduled.  Do not consume alcohol or use illegal drugs while on prescription medications. Report any adverse effects from your medications to your primary care provider promptly.  In the event of recurrent symptoms or worsening symptoms, call 911, a crisis hotline, or go to the nearest emergency department for evaluation.    Patient has been accepted to [facility] ED. patient to be transferred over.

## 2022-01-22 NOTE — ED Notes (Signed)
Pt threw trash can over window. Threatening to throw  chair over window. Demanding to leave. Security present.  Threw pillow over window at security. Will continue to monitor.

## 2022-01-22 NOTE — ED Notes (Signed)
Pt banging on the bathroom door, removed his shirt, demanding to leave and throwing his linen  over the window. Informed the provider via secure chat.

## 2022-01-22 NOTE — ED Notes (Signed)
After staff and security had to get Roberto Osborn out of the bathroom from being in there an excessive amount of time, he came out yelling that he wants to go, was told that he could not leave due to him being IVC'd. He became to yell racial slurs and screaming at staff. Has been going on for 10 minutes. He is not redirectable.

## 2022-01-22 NOTE — ED Notes (Signed)
Bed was remade and Pt removed the linen and threw it on the floor. IM Ativan and IM Geodon admin. Pt continues to curse and yell at the staff. Calling staff members bitches, niggers and racial slurs. Refused to allow staff to apply Band-Aid. Security present.

## 2022-01-22 NOTE — ED Notes (Signed)
Report called to [facility]

## 2022-01-22 NOTE — ED Provider Notes (Signed)
[facility] DEPT Provider Note   CSN: [id number] Arrival date & time: 01/22/22  2211     History {Add pertinent medical, surgical, social history, OB history to HPI:1} Chief Complaint  Patient presents with   IVC    Roberto Osborn is a 51 y.o. male with a hx of tobacco use, homelessness, & depression who presents to the ED from [name] under IVC due to aggressive behavior. Patient reports he was forced to go to [facility], he denies SI, HI, or hallucinations to me, does not answer further questions. Denies pain.   Per [place] in code/staff notes- patient aggressive, making racially inappropriate statements/threats, currently recommended for inpatient therapy- placement pending, however could not remain at [facility] due to safety concern. Received haldol, benadryl & ativan prior to arrival.   HPI     Home Medications Prior to Admission medications   Medication Sig Start Date End Date Taking? Authorizing Provider  citalopram (CELEXA) 10 MG tablet Take 1 tablet (10 mg total) by mouth daily. 08/28/21 09/27/21  Regan Lemming, MD  risperiDONE (RISPERDAL) 1 MG tablet Take 1 tablet (1 mg total) by mouth at bedtime. 08/28/21 09/27/21  Regan Lemming, MD      Allergies    Risperdal [risperidone]    Review of Systems   Review of Systems  Constitutional:  Negative for chills and fever.  Respiratory:  Negative for shortness of breath.   Cardiovascular:  Negative for chest pain.  Gastrointestinal:  Negative for abdominal pain.  Psychiatric/Behavioral:  Positive for behavioral problems. Negative for hallucinations and suicidal ideas.   All other systems reviewed and are negative.   Physical Exam Updated Vital Signs There were no vitals taken for this visit. Physical Exam Vitals and nursing note reviewed.  Constitutional:      General: He is not in acute distress.    Appearance: He is well-developed. He is not toxic-appearing.  HENT:     Head: Normocephalic and  atraumatic.  Eyes:     General:        Right eye: No discharge.        Left eye: No discharge.     Conjunctiva/sclera: Conjunctivae normal.  Cardiovascular:     Rate and Rhythm: Normal rate and regular rhythm.  Pulmonary:     Effort: No respiratory distress.     Breath sounds: Normal breath sounds. No wheezing or rales.  Abdominal:     General: There is no distension.     Palpations: Abdomen is soft.     Tenderness: There is no abdominal tenderness.  Musculoskeletal:     Cervical back: Neck supple.  Skin:    General: Skin is warm and dry.  Neurological:     Mental Status: He is alert.     Comments: Clear speech.   Psychiatric:        Mood and Affect: Affect is flat.     ED Results / Procedures / Treatments   Labs (all labs ordered are listed, but only abnormal results are displayed) Labs Reviewed - No data to display  EKG None  Radiology No results found.  Procedures Procedures  {Document cardiac monitor, telemetry assessment procedure when appropriate:1}  Medications Ordered in ED Medications - No data to display  ED Course/ Medical Decision Making/ A&P                           Medical Decision Making Amount and/or Complexity of Data Reviewed Labs: ordered.  Patient presents to the ED under IVC due to aggressive behavior/agitation @ [facility] Chart/nursing note reviewed for additional hx.   Labs ordered, viewed & interpreted by me: CBC, CMP, ethanol- unremarkable. Had negative covid test @ [facility].   Patient medically cleared Sleeping on re-assessment.  Recommended for inpatient therapy- pending placement per behavioral health.   Final Clinical Impression(s) / ED Diagnoses Final diagnoses:  Involuntary commitment    Rx / DC Orders ED Discharge Orders     None

## 2022-01-22 NOTE — ED Notes (Signed)
Patient in shower for a long time - security and staff escorted back to his bed at which time he began beating the phone against its stand and beating the doors and windows and yelling words like bitch, cunt and nigger.  Provider to bedside

## 2022-01-22 NOTE — ED Notes (Signed)
Patient given IM medications which he took willingly - patient to be transferred to a higher level of care

## 2022-01-23 DIAGNOSIS — R45851 Suicidal ideations: Secondary | ICD-10-CM | POA: Diagnosis not present

## 2022-01-23 DIAGNOSIS — F333 Major depressive disorder, recurrent, severe with psychotic symptoms: Secondary | ICD-10-CM | POA: Diagnosis not present

## 2022-01-23 DIAGNOSIS — F29 Unspecified psychosis not due to a substance or known physiological condition: Secondary | ICD-10-CM | POA: Diagnosis not present

## 2022-01-23 DIAGNOSIS — M25562 Pain in left knee: Secondary | ICD-10-CM | POA: Diagnosis not present

## 2022-01-23 DIAGNOSIS — Z046 Encounter for general psychiatric examination, requested by authority: Secondary | ICD-10-CM | POA: Diagnosis not present

## 2022-01-23 LAB — RAPID URINE DRUG SCREEN, HOSP PERFORMED
Amphetamines: NOT DETECTED
Barbiturates: NOT DETECTED
Benzodiazepines: POSITIVE — AB
Cocaine: NOT DETECTED
Opiates: NOT DETECTED
Tetrahydrocannabinol: NOT DETECTED

## 2022-01-23 MED ORDER — LORAZEPAM 2 MG/ML IJ SOLN
INTRAMUSCULAR | Status: AC
  Administered 2022-01-23: 2 mg via INTRAMUSCULAR
  Filled 2022-01-23: qty 1

## 2022-01-23 MED ORDER — OLANZAPINE 5 MG PO TBDP
5.0000 mg | ORAL_TABLET | Freq: Every day | ORAL | Status: DC
  Filled 2022-01-23: qty 1

## 2022-01-23 MED ORDER — LORAZEPAM 2 MG/ML IJ SOLN: 2.0000 mg | Freq: Once | INTRAMUSCULAR | Status: AC

## 2022-01-23 MED ORDER — HALOPERIDOL LACTATE 5 MG/ML IJ SOLN: 5.0000 mg | Freq: Once | INTRAMUSCULAR | Status: AC

## 2022-01-23 MED ORDER — ZIPRASIDONE MESYLATE 20 MG IM SOLR
20.0000 mg | Freq: Two times a day (BID) | INTRAMUSCULAR | Status: DC | PRN
  Administered 2022-01-24 – 2022-01-26 (×4): 20 mg via INTRAMUSCULAR
  Filled 2022-01-23 (×5): qty 20

## 2022-01-23 MED ORDER — DIPHENHYDRAMINE HCL 50 MG/ML IJ SOLN: 50.0000 mg | Freq: Once | INTRAMUSCULAR | Status: AC

## 2022-01-23 MED ORDER — DIPHENHYDRAMINE HCL 50 MG/ML IJ SOLN
INTRAMUSCULAR | Status: AC
  Administered 2022-01-23: 50 mg via INTRAMUSCULAR
  Filled 2022-01-23: qty 1

## 2022-01-23 MED ORDER — HALOPERIDOL LACTATE 5 MG/ML IJ SOLN
INTRAMUSCULAR | Status: AC
  Administered 2022-01-23: 5 mg via INTRAMUSCULAR
  Filled 2022-01-23: qty 1

## 2022-01-23 MED ORDER — OLANZAPINE 10 MG PO TBDP
10.0000 mg | ORAL_TABLET | Freq: Three times a day (TID) | ORAL | Status: DC | PRN
  Administered 2022-01-24 – 2022-01-25 (×3): 10 mg via ORAL
  Filled 2022-01-23 (×4): qty 1

## 2022-01-23 NOTE — ED Notes (Signed)
Pt ivc on 01/23/22 expires on 01/30/22

## 2022-01-23 NOTE — Progress Notes (Signed)
Roberto Osborn is a 51 year old male with psychiatric history of depression, anxiety and homelessness.  Patient was taken to [facility] by law enforcement for mental health evaluation.     Per IVC paperwork: "Respondent was in parking lot at [employer]. Respondent was wearing a hospital bracelet. Refused to give any information to [facility]. Respondent did not want to go to the hospital, and would not tell [facility] what hospital he came from. Respondent made multiple statements about wanting to cut his genitals off and punching himself in the face. Respondent was exposing himself, cursing and making derogatory statements regarding those of another race. Respondent may have Tourette Syndrome. Respondent is a danger to himself and others."    Per [facility] staff notes- patient aggressive, making racially inappropriate statements/threats, currently recommended for inpatient therapy- placement pending, however could not remain at [facility] due to safety concern.   Attempted to evaluate the patient for the second time today. During this evaluation, the patient did provide a few responses. He remained lying on his bed facing away from the provider. He is oriented. Speech is decreased in volume. He reports suicidal ideations without a plan. He endorses AH, when asked to elaborate on AH, he states "they say whatever you want them to say." He denies VH. No indication that he is currently responding to internal stimuli.   Continue olanzapine 5 mg QHS for mood stability/psychosis  Agitation protocol: Geodon 20 mg IM every 12 hours prn Or Olanzapine zydis 10 mg every 8 hours prn  Disposition: Continue to recommend inpatient psychiatric treatment.

## 2022-01-23 NOTE — ED Notes (Signed)
C/O generalized pain

## 2022-01-23 NOTE — Progress Notes (Signed)
Roberto Osborn is a 51 year old male with psychiatric history of depression, anxiety and homelessness.  Patient was taken to [facility] by law enforcement for mental health evaluation.    Per IVC paperwork: "Respondent was in parking lot at [employer]. Respondent was wearing a hospital bracelet. Refused to give any information to [facility]. Respondent did not want to go to the hospital, and would not tell [facility] what hospital he came from. Respondent made multiple statements about wanting to cut his genitals off and punching himself in the face. Respondent was exposing himself, cursing and making derogatory statements regarding those of another race. Respondent may have Tourette Syndrome. Respondent is a danger to himself and others."   Per [facility] staff notes- patient aggressive, making racially inappropriate statements/threats, currently recommended for inpatient therapy- placement pending, however could not remain at [facility] due to safety concern.   Attempted to evaluate patient. Patient is drowsy, arousable, but unable to participate in the assessment at this time. He received ativan 2 mg IM, benadryl 50 mg IM,and haldol 5 mg IM at 0828. Plan to evaluate patient when he able to participate in the assessment.   Patient was started on zyprexa 5 mg QHS while at [facility] Will continue here in the ED.

## 2022-01-23 NOTE — ED Notes (Signed)
Pt came out of room screaming, "I want out of this fucking hospital." "Fuck this God damn place." Security to room. Order for medications given.

## 2022-01-23 NOTE — ED Notes (Signed)
Pt IVC was expired due to not having first exam within 24 hours. Agricultural consultant and PA made aware that pt was not under IVC at this time due to paperwork not being complete.

## 2022-01-23 NOTE — ED Provider Notes (Signed)
Emergency Medicine Observation Re-evaluation Note  Roberto Osborn is a 51 y.o. male, seen on rounds today.  Pt initially presented to the ED for complaints of IVC Currently, the patient is sleeping comfortably in bed.  No acute events overnight.  He is currently here from the [place] under IVC due to aggressive behavior..  Physical Exam  BP 130/66 (BP Location: Right Arm)   Pulse (!) 122   Temp 98.2 F (36.8 C) (Oral)   Resp 20   Ht 6\' 1"  (1.854 m)   Wt 88.5 kg   SpO2 100%   BMI 25.74 kg/m  Physical Exam General: Sleeping comfortably in bed Cardiac: tachycardic Lungs: Satting 100% on room air Psych: Sleeping, not agitated  ED Course / MDM  EKG:   I have reviewed the labs performed to date as well as medications administered while in observation.  Recent changes in the last 24 hours include observation, medication reconciliation, TTS evaluation.  Plan  Current plan is for TTS evaluation and recommendation.    Elgie Congo, MD 01/23/22 [phone]

## 2022-01-23 NOTE — ED Notes (Signed)
Pt sleeping at this time. Will obtain EKG when he wakes.

## 2022-01-24 DIAGNOSIS — Z046 Encounter for general psychiatric examination, requested by authority: Secondary | ICD-10-CM

## 2022-01-24 DIAGNOSIS — M25562 Pain in left knee: Secondary | ICD-10-CM | POA: Diagnosis not present

## 2022-01-24 DIAGNOSIS — F333 Major depressive disorder, recurrent, severe with psychotic symptoms: Secondary | ICD-10-CM | POA: Diagnosis present

## 2022-01-24 DIAGNOSIS — F29 Unspecified psychosis not due to a substance or known physiological condition: Secondary | ICD-10-CM

## 2022-01-24 MED ORDER — LORAZEPAM 2 MG/ML IJ SOLN
INTRAMUSCULAR | Status: AC
  Filled 2022-01-24: qty 1

## 2022-01-24 MED ORDER — ZIPRASIDONE MESYLATE 20 MG IM SOLR: 20.0000 mg | Freq: Once | INTRAMUSCULAR | Status: DC

## 2022-01-24 MED ORDER — DIPHENHYDRAMINE HCL 50 MG/ML IJ SOLN
25.0000 mg | Freq: Once | INTRAMUSCULAR | Status: AC
  Administered 2022-01-24: 25 mg via INTRAMUSCULAR

## 2022-01-24 MED ORDER — DIPHENHYDRAMINE HCL 50 MG/ML IJ SOLN
INTRAMUSCULAR | Status: AC
  Filled 2022-01-24: qty 1

## 2022-01-24 MED ORDER — ZIPRASIDONE MESYLATE 20 MG IM SOLR
20.0000 mg | Freq: Once | INTRAMUSCULAR | Status: AC
  Administered 2022-01-24: 20 mg via INTRAMUSCULAR

## 2022-01-24 MED ORDER — LORAZEPAM 2 MG/ML IJ SOLN: 2.0000 mg | Freq: Once | INTRAMUSCULAR | Status: DC

## 2022-01-24 MED ORDER — OLANZAPINE 10 MG IM SOLR: 5.0000 mg | Freq: Every day | INTRAMUSCULAR | Status: DC

## 2022-01-24 MED ORDER — STERILE WATER FOR INJECTION IJ SOLN
INTRAMUSCULAR | Status: AC
  Administered 2022-01-24: 1.2 mL
  Filled 2022-01-24: qty 10

## 2022-01-24 MED ORDER — DIPHENHYDRAMINE HCL 50 MG/ML IJ SOLN: 12.5000 mg | Freq: Once | INTRAMUSCULAR | Status: DC

## 2022-01-24 MED ORDER — OLANZAPINE 5 MG PO TBDP: 5.0000 mg | ORAL_TABLET | Freq: Every day | ORAL | Status: DC

## 2022-01-24 MED ORDER — LORAZEPAM 2 MG/ML IJ SOLN
2.0000 mg | Freq: Once | INTRAMUSCULAR | Status: AC
  Administered 2022-01-24: 2 mg via INTRAMUSCULAR

## 2022-01-24 MED ORDER — OLANZAPINE 5 MG PO TBDP: 5.0000 mg | ORAL_TABLET | Freq: Once | ORAL | Status: DC

## 2022-01-24 MED ORDER — OLANZAPINE 10 MG PO TBDP
10.0000 mg | ORAL_TABLET | Freq: Every day | ORAL | Status: DC
  Administered 2022-01-24 – 2022-01-25 (×2): 10 mg via ORAL
  Filled 2022-01-24 (×2): qty 1

## 2022-01-24 MED ORDER — STERILE WATER FOR INJECTION IJ SOLN
INTRAMUSCULAR | Status: AC
  Filled 2022-01-24: qty 10

## 2022-01-24 NOTE — Progress Notes (Addendum)
[facility] Psych ED Progress Note  01/24/2022 12:07 PM Roberto Osborn  MRN:  [id number]   Subjective:  Roberto Osborn is a 51 year old male with psychiatric history of depression, anxiety and homelessness.  Patient was brought to [facility] by law enforcement for mental health evaluation. Patient was seen face to face this am and remains agitated, aggressive and continues to yell at staff using obscene language.  Patient remains paranoid stating that people are out to kill him like they killed his brother.  Patient reported that he has been diagnosed "Manic Depression" long time ago and is not taking medications.  Patient frequently goes to the bathroom and sits for a long time throwing things around in the bathroom.  He is not taking oral Medications at this time and has required the use of Geodon to manage his mood and behavior.  He continues to require inpatient hospitalization for stabilization.   Principal Problem: Psychotic disorder ([place]) Diagnosis:  Principal Problem:   Psychotic disorder ([place]) Active Problems:   Severe recurrent major depressive disorder with psychotic features [facility])   ED Assessment Time Calculation: Start Time: 1056 Stop Time: 1120 Total Time in Minutes (Assessment Completion): 24   Past Psychiatric History: See initial psychiatric evaluation  [place] Scale:  [facility] ED from 01/22/2022 in [place] DEPT Most recent reading at 01/22/2022 10:19 PM ED from 01/21/2022 in [facility] Most recent reading at 01/21/2022 11:24 PM ED from 01/21/2022 in [place] Most recent reading at 01/21/2022 11:16 AM  C-SSRS RISK CATEGORY Moderate Risk High Risk Error: Q3, 4, or 5 should not be populated when Q2 is No       Past Medical History:  Past Medical History:  Diagnosis Date   Homeless    History reviewed. No pertinent surgical history. Family History: History reviewed. No  pertinent family history. Family Psychiatric  History: see initial psychiatric evaluation Social History:  Social History   Substance and Sexual Activity  Alcohol Use Yes   Comment: occasionally     Social History   Substance and Sexual Activity  Drug Use Not Currently   Types: Marijuana    Social History   Socioeconomic History   Marital status: Single    Spouse name: Not on file   Number of children: Not on file   Years of education: Not on file   Highest education level: Not on file  Occupational History   Not on file  Tobacco Use   Smoking status: Every Day    Types: Cigarettes, Cigars   Smokeless tobacco: Not on file  Vaping Use   Vaping Use: Never used  Substance and Sexual Activity   Alcohol use: Yes    Comment: occasionally   Drug use: Not Currently    Types: Marijuana   Sexual activity: Not on file  Other Topics Concern   Not on file  Social History Narrative   Not on file   Social Determinants of Health   Financial Resource Strain: Not on file  Food Insecurity: Not on file  Transportation Needs: Not on file  Physical Activity: Not on file  Stress: Not on file  Social Connections: Not on file    Sleep: Poor  Appetite:  Fair  Current Medications: Current Facility-Administered Medications  Medication Dose Route Frequency Provider Last Rate Last Admin   acetaminophen (TYLENOL) tablet 650 mg  650 mg Oral Q4H PRN Petrucelli, Samantha R, PA-C   650 mg at  01/23/22 2222   OLANZapine zydis (ZYPREXA) disintegrating tablet 10 mg  10 mg Oral Q8H PRN Nira Conn A, NP       OLANZapine zydis (ZYPREXA) disintegrating tablet 10 mg  10 mg Oral QHS Miley Blanchett C, NP       OLANZapine zydis (ZYPREXA) disintegrating tablet 5 mg  5 mg Oral Once Reno Clasby C, NP       ziprasidone (GEODON) injection 20 mg  20 mg Intramuscular Q12H PRN Nira Conn A, NP   20 mg at 01/24/22 0124   Current Outpatient Medications  Medication Sig Dispense Refill    citalopram (CELEXA) 10 MG tablet Take 1 tablet (10 mg total) by mouth daily. (Patient not taking: Reported on 01/23/2022) 30 tablet 0   risperiDONE (RISPERDAL) 1 MG tablet Take 1 tablet (1 mg total) by mouth at bedtime. (Patient not taking: Reported on 01/23/2022) 30 tablet 0    Lab Results:  Results for orders placed or performed during the hospital encounter of 01/22/22 (from the past 48 hour(s))  Urine rapid drug screen (hosp performed)     Status: Abnormal   Collection Time: 01/22/22  4:56 PM  Result Value Ref Range   Opiates NONE DETECTED NONE DETECTED   Cocaine NONE DETECTED NONE DETECTED   Benzodiazepines POSITIVE (A) NONE DETECTED   Amphetamines NONE DETECTED NONE DETECTED   Tetrahydrocannabinol NONE DETECTED NONE DETECTED   Barbiturates NONE DETECTED NONE DETECTED    Comment: (NOTE) DRUG SCREEN FOR MEDICAL PURPOSES ONLY.  IF CONFIRMATION IS NEEDED FOR ANY PURPOSE, NOTIFY LAB WITHIN 5 DAYS.  LOWEST DETECTABLE LIMITS FOR URINE DRUG SCREEN Drug Class                     Cutoff (ng/mL) Amphetamine and metabolites    1000 Barbiturate and metabolites    200 Benzodiazepine                 200 Tricyclics and metabolites     300 Opiates and metabolites        300 Cocaine and metabolites        300 THC                            50 Performed at [facility], [address], [place], [place] [address]   Comprehensive metabolic panel     Status: None   Collection Time: 01/22/22 10:25 PM  Result Value Ref Range   Sodium 142 135 - 145 mmol/L   Potassium 4.1 3.5 - 5.1 mmol/L   Chloride 107 98 - 111 mmol/L   CO2 29 22 - 32 mmol/L   Glucose, Bld 90 70 - 99 mg/dL    Comment: Glucose reference range applies only to samples taken after fasting for at least 8 hours.   BUN 13 6 - 20 mg/dL   Creatinine, Ser 2.99 0.61 - 1.24 mg/dL   Calcium 9.8 8.9 - 24.2 mg/dL   Total Protein 6.5 6.5 - 8.1 g/dL   Albumin 4.1 3.5 - 5.0 g/dL   AST 23 15 - 41 U/L   ALT 16 0 - 44 U/L    Alkaline Phosphatase 72 38 - 126 U/L   Total Bilirubin 0.8 0.3 - 1.2 mg/dL   GFR, Estimated >68 >34 mL/min    Comment: (NOTE) Calculated using the CKD-EPI Creatinine Equation (2021)    Anion gap 6 5 - 15    Comment: Performed at  [facility], [address] [place], [place] [address]  CBC     Status: None   Collection Time: 01/22/22 10:25 PM  Result Value Ref Range   WBC 7.8 4.0 - 10.5 K/uL   RBC 5.00 4.22 - 5.81 MIL/uL   Hemoglobin 14.8 13.0 - 17.0 g/dL   HCT 87.8 67.6 - 72.0 %   MCV 92.2 80.0 - 100.0 fL   MCH 29.6 26.0 - 34.0 pg   MCHC 32.1 30.0 - 36.0 g/dL   RDW 94.7 09.6 - 28.3 %   Platelets 200 150 - 400 K/uL   nRBC 0.0 0.0 - 0.2 %    Comment: Performed at [facility], [address] [place], [place] [address]  Ethanol     Status: None   Collection Time: 01/22/22 10:25 PM  Result Value Ref Range   Alcohol, Ethyl (B) <10 <10 mg/dL    Comment: (NOTE) Lowest detectable limit for serum alcohol is 10 mg/dL.  For medical purposes only. Performed at [facility], [address], [place], [place] [address]     Blood Alcohol level:  Lab Results  Component Value Date   [facility] <10 01/22/2022   ETH <10 01/04/2022    Physical Findings:  CIWA:    COWS:     Musculoskeletal: Strength & Muscle Tone: within normal limits Gait & Station: normal Patient leans: Front  Psychiatric Specialty Exam:  Presentation  General Appearance: Casual; Disheveled  Eye Contact:Fair  Speech:Pressured  Speech Volume:Increased  Handedness:Right   Mood and Affect  Mood:Angry; Irritable; Labile  Affect:Congruent; Labile; Inappropriate   Thought Process  Thought Processes:Disorganized; Irrevelant  Descriptions of Associations:Loose  Orientation:Full (Time, Place and Person)  Thought Content:Delusions; Paranoid Ideation; Illogical  History of Schizophrenia/Schizoaffective disorder:No  Duration of Psychotic  Symptoms:Greater than six months  Hallucinations:Hallucinations: None  Ideas of Reference:Paranoia; Delusions (People are out to kill him the way they killed his brother.)  Suicidal Thoughts:Suicidal Thoughts: Yes, Passive  Homicidal Thoughts:Homicidal Thoughts: Yes, Passive   Sensorium  Memory:Immediate Poor; Recent Poor; Remote Poor  Judgment:Poor  Insight:Poor   Executive Functions  Concentration:Poor  Attention Span:Poor  Recall:Poor  Fund of Knowledge:Poor  Language:Poor   Psychomotor Activity  Psychomotor Activity:Psychomotor Activity: Increased   Assets  Assets:Desire for Improvement; Physical Health   Sleep  Sleep:Sleep: Poor    Physical Exam: Unable to obtain, patient uncooperative, agitated. Physical Exam ROS Blood pressure 111/64, pulse 88, temperature 98 F (36.7 C), temperature source Oral, resp. rate 18, height 6\' 1"  (1.854 m), weight 88.5 kg, SpO2 98 %. Body mass index is 25.74 kg/m.   Medical Decision Making: Patient continues to meet criteria for inpatient psychiatric hospitalization and meanwhile we will offer Scheduled Olanzapine QHS- Will use Geodon  and PRN Olanzapine as needed for severe agitation.  Problem 1: Psychosis  Problem 2: Severe Recurrent Major Depressive disorder with Psychotic features.   , NP-PMHNP-BC 01/24/2022, 12:07 PM

## 2022-01-24 NOTE — ED Notes (Signed)
Pt swearing at staff. States "youre all fucking bitches, every single one out here" as well as yelling other profanities. Advised pt to stop verbally assaulting staff.

## 2022-01-24 NOTE — ED Provider Notes (Signed)
Emergency Medicine Observation Re-evaluation Note  Roberto Osborn is a 51 y.o. male, seen on rounds today.  Pt initially presented to the ED for complaints of IVC Currently, the patient is sleeping.  Physical Exam  BP 125/79 (BP Location: Left Arm)   Pulse 91   Temp 98 F (36.7 C) (Oral)   Resp 18   Ht 6\' 1"  (1.854 m)   Wt 88.5 kg   SpO2 99%   BMI 25.74 kg/m  Physical Exam General: Sleeping Cardiac: Extremities well-perfused Lungs: Breathing is unlabored Psych: Deferred  ED Course / MDM  EKG:   I have reviewed the labs performed to date as well as medications administered while in observation.  Recent changes in the last 24 hours include masturbating, yelling profanities and racial slurs overnight.  Received Geodon.  Plan  Current plan is for inpatient psychiatric admission.    Godfrey Pick, MD 01/24/22 [phone]

## 2022-01-24 NOTE — ED Notes (Addendum)
Pt refusing v/s. Pt throwing BP cuff at sitter. Pt cursing at staff. RN witnessed same.

## 2022-01-24 NOTE — Progress Notes (Addendum)
ADDENDUM  Patient has been denied by [facility]. Patient has already been referred to out of system facilities.   Signed:  Durenda Hurt, MSW, LCSWA, LCAS 01/24/2022 1:01 PM  [facility] LCSW Progress Note   01/24/2022    12:25 PM  [name]   [id number]   Type of Contact and Topic:  Psychiatric Bed Placement     Patient information has been sent to [facility] via secure chat to review for potential admission. Patient has not yet been accepted at this time. Patient meets inpatient criteria per Charmaine Downs, NP.   Situation ongoing, CSW will continue to monitor and update note as more information becomes available.    Signed:  Durenda Hurt, MSW, LCSWA, LCAS 01/24/2022 12:25 PM

## 2022-01-24 NOTE — ED Notes (Signed)
Pt heard in restroom throwing things around. Upon checking on pt, he came out of restroom cursing at staff and repeatedly calling RN and sitter by racial slurs. Pt was told to get back in his bed and to stop using vulgar language with staff. Pt continues behavior.

## 2022-01-24 NOTE — Progress Notes (Signed)
[facility]/BMU LCSW Progress Note   01/24/2022    11:31 AM  Roberto Osborn   [id number]   Type of Contact and Topic: Psychiatric Bed Placement  Patietn referred out by direction of Hampton Abbot, MD, Medical Director due to limited bed availability and high demand.    Patient meets inpatient criteria per Lindon Romp, NP. Patient referred to the following facilities:  Destination Service Provider Request Status Address Phone Fax  [facility] [address] [place] [address] [place]  [place] [address] [place] [address] [phone]  [place], [place] [address] [phone] [facility]  Pending - Request Sent [place], [place] [address] [phone]  [facility] [address] Jeanene Erb [place] [address] [phone]  [facility] [address] [place] [address] [phone] [place] [address] Diamantina Monks [place] [address] [phone]  [facility]  Pending - Request Sent [place], [place] [address] [place]  [facility]  Pending - Request Sent [address] [place] [address] [phone] [place] [address] [place], [place] [address] [phone]    CSW will continue to monitor disposition.   Signed:  Durenda Hurt, MSW, [place], LCASA 01/24/2022 11:31 AM

## 2022-01-24 NOTE — ED Notes (Signed)
Patient was throwing stuff in bathroom and in his room. Went to check on patient and he just came out of room naked and cursing at everyone. He was becoming increasingly agitated. Administered PRN 20mg  IM geodon with security present for safety. Will continue to monitor.

## 2022-01-24 NOTE — ED Notes (Addendum)
Patient banged door 3 times. Lanny Hurst, MHT, went to speak to patient. Due to his agitation I gave patient his PRN PO zyprexa with HS medication. Will continue to monitor.

## 2022-01-24 NOTE — ED Notes (Signed)
Pt is using racial slurs and stating he hopes that staff dies, provider made aware

## 2022-01-24 NOTE — ED Notes (Addendum)
Pt pulled pants down and began masturbating. When he realized that his behavior was seen by nurse and that she was requesting restraints/medication he stopped. Pt continues yelling profanities at staff and is not redirectable. Security at bedside.

## 2022-01-24 NOTE — BH Assessment (Addendum)
Per Jonelle Sidle, patient accepted to [facility] 01/26/2022 (anytime after 8am). Nurse report [phone] (pager). Accepting provider is Jonelle Sports, MD.   Note: Delay in patient placement at [facility] because they must wait on an appropriate bed to become available.   Patient's nurse Jenny Reichmann, RN) provided disposition updates.

## 2022-01-25 DIAGNOSIS — M25562 Pain in left knee: Secondary | ICD-10-CM | POA: Diagnosis not present

## 2022-01-25 MED ORDER — STERILE WATER FOR INJECTION IJ SOLN
INTRAMUSCULAR | Status: AC
  Administered 2022-01-25: 1.2 mL
  Filled 2022-01-25: qty 10

## 2022-01-25 NOTE — ED Notes (Signed)
Patient has been alert this shift.  Patient medication complaint. Patient labile. Loud and yelling at times, redirectable.  Patient disorganized an rambling at times.

## 2022-01-25 NOTE — ED Notes (Signed)
Patient agitated and escalating. He is yelling and balling his fist. Administered IM geodon with security and [employer] present for safety. Charge RN was present as well. Will continue to monitor.

## 2022-01-25 NOTE — ED Notes (Signed)
Patient has been accepted to [facility]. The bed will be ready 9/26.

## 2022-01-25 NOTE — ED Provider Notes (Signed)
Emergency Medicine Observation Re-evaluation Note  Roberto Osborn is a 51 y.o. male, seen on rounds today.  Pt initially presented to the ED for complaints of IVC Currently, the patient is pacing in room.  Physical Exam  BP 123/67 (BP Location: Left Arm)   Pulse 82   Temp 97.9 F (36.6 C) (Oral)   Resp 18   Ht 6\' 1"  (1.854 m)   Wt 88.5 kg   SpO2 100%   BMI 25.74 kg/m  Physical Exam General: Awake, alert Cardiac: Extremities well-perfused Lungs: Breathing is unlabored Psych: No current agitation  ED Course / MDM  EKG:EKG Interpretation  Date/Time:  Sunday January 23 2022 22:28:04 EDT Ventricular Rate:  96 PR Interval:  120 QRS Duration: 86 QT Interval:  336 QTC Calculation: 424 R Axis:   84 Text Interpretation: Normal sinus rhythm Normal ECG When compared with ECG of 18-Aug-2021 21:17, PREVIOUS ECG IS PRESENT Confirmed by Godfrey Pick [phone]) on 01/24/2022 10:34:27 AM  I have reviewed the labs performed to date as well as medications administered while in observation.  Recent changes in the last 24 hours include continued agitation last night.  He received more Geodon.  Plan  Current plan is for inpatient psychiatric admission.    Godfrey Pick, MD 01/25/22 [phone]

## 2022-01-25 NOTE — ED Notes (Signed)
Patient has been sleeping during the shift after giving him IM geodon.

## 2022-01-25 NOTE — ED Notes (Signed)
Patient yelling, difficult to redirect.  PRN Zyprexa 10 mg PO given,  effective.

## 2022-01-26 DIAGNOSIS — F200 Paranoid schizophrenia: Secondary | ICD-10-CM | POA: Diagnosis not present

## 2022-01-26 DIAGNOSIS — Z046 Encounter for general psychiatric examination, requested by authority: Secondary | ICD-10-CM | POA: Diagnosis not present

## 2022-01-26 DIAGNOSIS — F29 Unspecified psychosis not due to a substance or known physiological condition: Secondary | ICD-10-CM | POA: Diagnosis not present

## 2022-01-26 DIAGNOSIS — F312 Bipolar disorder, current episode manic severe with psychotic features: Secondary | ICD-10-CM | POA: Diagnosis not present

## 2022-01-26 DIAGNOSIS — M25562 Pain in left knee: Secondary | ICD-10-CM | POA: Diagnosis not present

## 2022-01-26 DIAGNOSIS — Z91148 Patient's other noncompliance with medication regimen for other reason: Secondary | ICD-10-CM | POA: Diagnosis not present

## 2022-01-26 DIAGNOSIS — Z5902 Unsheltered homelessness: Secondary | ICD-10-CM | POA: Diagnosis not present

## 2022-01-26 DIAGNOSIS — F333 Major depressive disorder, recurrent, severe with psychotic symptoms: Secondary | ICD-10-CM | POA: Diagnosis not present

## 2022-01-26 DIAGNOSIS — Z5900 Homelessness unspecified: Secondary | ICD-10-CM | POA: Diagnosis not present

## 2022-01-26 DIAGNOSIS — G259 Extrapyramidal and movement disorder, unspecified: Secondary | ICD-10-CM | POA: Diagnosis not present

## 2022-01-26 DIAGNOSIS — R000 Tachycardia, unspecified: Secondary | ICD-10-CM | POA: Diagnosis not present

## 2022-01-26 DIAGNOSIS — R45851 Suicidal ideations: Secondary | ICD-10-CM | POA: Diagnosis not present

## 2022-01-26 DIAGNOSIS — Z20822 Contact with and (suspected) exposure to covid-19: Secondary | ICD-10-CM | POA: Diagnosis not present

## 2022-01-26 NOTE — ED Provider Notes (Signed)
Emergency Medicine Observation Re-evaluation Note  Roberto Osborn is a 51 y.o. male, seen on rounds today.  Pt initially presented to the ED for complaints of IVC Currently, the patient is asleep.  Physical Exam  BP 135/73 (BP Location: Right Arm)   Pulse 80   Temp (!) 97 F (36.1 C) (Oral)   Resp 18   Ht 6\' 1"  (1.854 m)   Wt 88.5 kg   SpO2 100%   BMI 25.74 kg/m  Physical Exam General: sleeping comfortably Cardiac: Regular rate Lungs: No respiratory distress Psych: Asleep  ED Course / MDM  EKG:EKG Interpretation  Date/Time:  Sunday January 23 2022 22:28:04 EDT Ventricular Rate:  96 PR Interval:  120 QRS Duration: 86 QT Interval:  336 QTC Calculation: 424 R Axis:   84 Text Interpretation: Normal sinus rhythm Normal ECG When compared with ECG of 18-Aug-2021 21:17, PREVIOUS ECG IS PRESENT Confirmed by Godfrey Pick [phone]) on 01/24/2022 10:34:27 AM  I have reviewed the labs performed to date as well as medications administered while in observation.  Recent changes in the last 24 hours include continued agitation, received IM geodon early this AM.  Plan  Current plan is for inpatient psychiatric hospitalization.    Cristie Hem, MD 01/26/22 [phone]

## 2022-01-26 NOTE — ED Notes (Signed)
Patient is sleeping. We will do VSs when he wakes up.

## 2022-01-26 NOTE — ED Notes (Signed)
Pt threw breakfast all over the floor, refusing to pick it up. Pt then came out of room screaming at staff demanding coffee. Pt instructed to go back into room, escorted back to room by security. Pt continues to yell, spit on door of room. Attempted to give pt PO zyprexa which pt threw in this RN's face. Spoke with charge RN who says to go ahead and give Geodon.

## 2022-01-26 NOTE — ED Notes (Signed)
Paged [facility] for report

## 2022-02-19 DIAGNOSIS — G8918 Other acute postprocedural pain: Secondary | ICD-10-CM | POA: Diagnosis not present

## 2022-02-19 DIAGNOSIS — F29 Unspecified psychosis not due to a substance or known physiological condition: Secondary | ICD-10-CM | POA: Diagnosis not present

## 2022-02-19 DIAGNOSIS — R410 Disorientation, unspecified: Secondary | ICD-10-CM | POA: Diagnosis not present

## 2022-02-19 DIAGNOSIS — R440 Auditory hallucinations: Secondary | ICD-10-CM | POA: Diagnosis not present

## 2022-02-19 DIAGNOSIS — Z5900 Homelessness unspecified: Secondary | ICD-10-CM | POA: Diagnosis not present

## 2022-02-19 DIAGNOSIS — Z5902 Unsheltered homelessness: Secondary | ICD-10-CM | POA: Diagnosis not present

## 2022-02-19 DIAGNOSIS — I10 Essential (primary) hypertension: Secondary | ICD-10-CM | POA: Diagnosis not present

## 2022-02-19 DIAGNOSIS — R441 Visual hallucinations: Secondary | ICD-10-CM | POA: Diagnosis not present

## 2022-02-19 DIAGNOSIS — M25569 Pain in unspecified knee: Secondary | ICD-10-CM | POA: Diagnosis not present

## 2022-02-19 DIAGNOSIS — F17210 Nicotine dependence, cigarettes, uncomplicated: Secondary | ICD-10-CM | POA: Diagnosis not present

## 2022-02-19 DIAGNOSIS — F259 Schizoaffective disorder, unspecified: Secondary | ICD-10-CM | POA: Diagnosis not present

## 2022-02-21 DIAGNOSIS — R531 Weakness: Secondary | ICD-10-CM | POA: Diagnosis not present

## 2022-02-22 DIAGNOSIS — R45851 Suicidal ideations: Secondary | ICD-10-CM | POA: Diagnosis not present

## 2022-02-22 DIAGNOSIS — Z9152 Personal history of nonsuicidal self-harm: Secondary | ICD-10-CM | POA: Diagnosis not present

## 2022-02-22 DIAGNOSIS — R440 Auditory hallucinations: Secondary | ICD-10-CM | POA: Diagnosis not present

## 2022-02-22 DIAGNOSIS — F17210 Nicotine dependence, cigarettes, uncomplicated: Secondary | ICD-10-CM | POA: Diagnosis not present

## 2022-02-22 DIAGNOSIS — F602 Antisocial personality disorder: Secondary | ICD-10-CM | POA: Diagnosis not present

## 2022-02-22 DIAGNOSIS — Z20822 Contact with and (suspected) exposure to covid-19: Secondary | ICD-10-CM | POA: Diagnosis not present

## 2022-02-22 DIAGNOSIS — F99 Mental disorder, not otherwise specified: Secondary | ICD-10-CM | POA: Diagnosis not present

## 2022-02-22 DIAGNOSIS — I10 Essential (primary) hypertension: Secondary | ICD-10-CM | POA: Diagnosis not present

## 2022-02-22 DIAGNOSIS — R441 Visual hallucinations: Secondary | ICD-10-CM | POA: Diagnosis not present

## 2022-02-22 DIAGNOSIS — F250 Schizoaffective disorder, bipolar type: Secondary | ICD-10-CM | POA: Diagnosis not present

## 2022-02-23 DIAGNOSIS — E7800 Pure hypercholesterolemia, unspecified: Secondary | ICD-10-CM | POA: Diagnosis not present

## 2022-02-23 DIAGNOSIS — F602 Antisocial personality disorder: Secondary | ICD-10-CM | POA: Diagnosis not present

## 2022-02-23 DIAGNOSIS — F250 Schizoaffective disorder, bipolar type: Secondary | ICD-10-CM | POA: Diagnosis not present

## 2022-02-23 DIAGNOSIS — F840 Autistic disorder: Secondary | ICD-10-CM | POA: Diagnosis not present

## 2022-02-23 DIAGNOSIS — R45851 Suicidal ideations: Secondary | ICD-10-CM | POA: Diagnosis not present

## 2022-02-23 DIAGNOSIS — Z5902 Unsheltered homelessness: Secondary | ICD-10-CM | POA: Diagnosis not present

## 2022-02-23 DIAGNOSIS — T50906D Underdosing of unspecified drugs, medicaments and biological substances, subsequent encounter: Secondary | ICD-10-CM | POA: Diagnosis not present

## 2022-02-23 DIAGNOSIS — Z91148 Patient's other noncompliance with medication regimen for other reason: Secondary | ICD-10-CM | POA: Diagnosis not present

## 2022-02-23 DIAGNOSIS — I10 Essential (primary) hypertension: Secondary | ICD-10-CM | POA: Diagnosis not present

## 2022-03-02 DIAGNOSIS — I10 Essential (primary) hypertension: Secondary | ICD-10-CM | POA: Diagnosis not present

## 2022-03-02 DIAGNOSIS — T50906D Underdosing of unspecified drugs, medicaments and biological substances, subsequent encounter: Secondary | ICD-10-CM | POA: Diagnosis not present

## 2022-03-02 DIAGNOSIS — F250 Schizoaffective disorder, bipolar type: Secondary | ICD-10-CM | POA: Diagnosis not present

## 2022-03-02 DIAGNOSIS — F602 Antisocial personality disorder: Secondary | ICD-10-CM | POA: Diagnosis not present

## 2022-03-02 DIAGNOSIS — Z5902 Unsheltered homelessness: Secondary | ICD-10-CM | POA: Diagnosis not present

## 2022-03-02 DIAGNOSIS — E7800 Pure hypercholesterolemia, unspecified: Secondary | ICD-10-CM | POA: Diagnosis not present

## 2022-03-02 DIAGNOSIS — F840 Autistic disorder: Secondary | ICD-10-CM | POA: Diagnosis not present

## 2022-03-02 DIAGNOSIS — Z91148 Patient's other noncompliance with medication regimen for other reason: Secondary | ICD-10-CM | POA: Diagnosis not present

## 2022-03-02 DIAGNOSIS — R45851 Suicidal ideations: Secondary | ICD-10-CM | POA: Diagnosis not present

## 2022-03-03 DIAGNOSIS — F250 Schizoaffective disorder, bipolar type: Secondary | ICD-10-CM | POA: Diagnosis not present

## 2022-03-03 DIAGNOSIS — E7800 Pure hypercholesterolemia, unspecified: Secondary | ICD-10-CM | POA: Diagnosis not present

## 2022-03-03 DIAGNOSIS — F840 Autistic disorder: Secondary | ICD-10-CM | POA: Diagnosis not present

## 2022-03-03 DIAGNOSIS — F602 Antisocial personality disorder: Secondary | ICD-10-CM | POA: Diagnosis not present

## 2022-03-03 DIAGNOSIS — Z91148 Patient's other noncompliance with medication regimen for other reason: Secondary | ICD-10-CM | POA: Diagnosis not present

## 2022-03-03 DIAGNOSIS — R45851 Suicidal ideations: Secondary | ICD-10-CM | POA: Diagnosis not present

## 2022-03-03 DIAGNOSIS — Z5902 Unsheltered homelessness: Secondary | ICD-10-CM | POA: Diagnosis not present

## 2022-03-03 DIAGNOSIS — T50906D Underdosing of unspecified drugs, medicaments and biological substances, subsequent encounter: Secondary | ICD-10-CM | POA: Diagnosis not present

## 2022-03-03 DIAGNOSIS — I10 Essential (primary) hypertension: Secondary | ICD-10-CM | POA: Diagnosis not present

## 2022-03-05 DIAGNOSIS — F250 Schizoaffective disorder, bipolar type: Secondary | ICD-10-CM | POA: Diagnosis not present

## 2022-03-21 DIAGNOSIS — I10 Essential (primary) hypertension: Secondary | ICD-10-CM | POA: Diagnosis not present

## 2022-03-21 DIAGNOSIS — F319 Bipolar disorder, unspecified: Secondary | ICD-10-CM | POA: Diagnosis not present

## 2022-03-21 DIAGNOSIS — Z5900 Homelessness unspecified: Secondary | ICD-10-CM | POA: Diagnosis not present

## 2022-03-21 DIAGNOSIS — Z888 Allergy status to other drugs, medicaments and biological substances status: Secondary | ICD-10-CM | POA: Diagnosis not present

## 2022-03-21 DIAGNOSIS — R519 Headache, unspecified: Secondary | ICD-10-CM | POA: Diagnosis not present

## 2022-03-21 DIAGNOSIS — Z79899 Other long term (current) drug therapy: Secondary | ICD-10-CM | POA: Diagnosis not present

## 2022-03-21 DIAGNOSIS — F17210 Nicotine dependence, cigarettes, uncomplicated: Secondary | ICD-10-CM | POA: Diagnosis not present

## 2022-03-21 DIAGNOSIS — F259 Schizoaffective disorder, unspecified: Secondary | ICD-10-CM | POA: Diagnosis not present

## 2022-03-24 DIAGNOSIS — F17210 Nicotine dependence, cigarettes, uncomplicated: Secondary | ICD-10-CM | POA: Diagnosis not present

## 2022-03-24 DIAGNOSIS — F259 Schizoaffective disorder, unspecified: Secondary | ICD-10-CM | POA: Diagnosis not present

## 2022-03-24 DIAGNOSIS — Z5900 Homelessness unspecified: Secondary | ICD-10-CM | POA: Diagnosis not present

## 2022-03-24 DIAGNOSIS — R5383 Other fatigue: Secondary | ICD-10-CM | POA: Diagnosis not present

## 2022-03-24 DIAGNOSIS — Z888 Allergy status to other drugs, medicaments and biological substances status: Secondary | ICD-10-CM | POA: Diagnosis not present

## 2022-03-24 DIAGNOSIS — Z743 Need for continuous supervision: Secondary | ICD-10-CM | POA: Diagnosis not present

## 2022-03-24 DIAGNOSIS — R6889 Other general symptoms and signs: Secondary | ICD-10-CM | POA: Diagnosis not present

## 2022-03-24 DIAGNOSIS — Z79899 Other long term (current) drug therapy: Secondary | ICD-10-CM | POA: Diagnosis not present

## 2022-03-25 DIAGNOSIS — F259 Schizoaffective disorder, unspecified: Secondary | ICD-10-CM | POA: Diagnosis not present

## 2022-03-25 DIAGNOSIS — Z91199 Patient's noncompliance with other medical treatment and regimen due to unspecified reason: Secondary | ICD-10-CM | POA: Diagnosis not present

## 2022-03-25 DIAGNOSIS — Z711 Person with feared health complaint in whom no diagnosis is made: Secondary | ICD-10-CM | POA: Diagnosis not present

## 2022-03-25 DIAGNOSIS — I959 Hypotension, unspecified: Secondary | ICD-10-CM | POA: Diagnosis not present

## 2022-03-25 DIAGNOSIS — Z5900 Homelessness unspecified: Secondary | ICD-10-CM | POA: Diagnosis not present

## 2022-03-25 DIAGNOSIS — R001 Bradycardia, unspecified: Secondary | ICD-10-CM | POA: Diagnosis not present

## 2022-03-25 DIAGNOSIS — R0689 Other abnormalities of breathing: Secondary | ICD-10-CM | POA: Diagnosis not present

## 2022-03-25 DIAGNOSIS — F17210 Nicotine dependence, cigarettes, uncomplicated: Secondary | ICD-10-CM | POA: Diagnosis not present

## 2022-03-25 DIAGNOSIS — M542 Cervicalgia: Secondary | ICD-10-CM | POA: Diagnosis not present

## 2022-03-25 DIAGNOSIS — Z5941 Food insecurity: Secondary | ICD-10-CM | POA: Diagnosis not present

## 2022-03-30 DIAGNOSIS — Z79899 Other long term (current) drug therapy: Secondary | ICD-10-CM | POA: Diagnosis not present

## 2022-03-30 DIAGNOSIS — F17210 Nicotine dependence, cigarettes, uncomplicated: Secondary | ICD-10-CM | POA: Diagnosis not present

## 2022-03-30 DIAGNOSIS — Z888 Allergy status to other drugs, medicaments and biological substances status: Secondary | ICD-10-CM | POA: Diagnosis not present

## 2022-03-30 DIAGNOSIS — F259 Schizoaffective disorder, unspecified: Secondary | ICD-10-CM | POA: Diagnosis not present

## 2022-03-30 DIAGNOSIS — Z20822 Contact with and (suspected) exposure to covid-19: Secondary | ICD-10-CM | POA: Diagnosis not present

## 2022-03-30 DIAGNOSIS — J029 Acute pharyngitis, unspecified: Secondary | ICD-10-CM | POA: Diagnosis not present

## 2022-05-19 ENCOUNTER — Emergency Department (HOSPITAL_COMMUNITY): Payer: Medicare PPO

## 2022-05-19 ENCOUNTER — Other Ambulatory Visit: Payer: Self-pay

## 2022-05-19 ENCOUNTER — Emergency Department (HOSPITAL_COMMUNITY)
Admission: EM | Admit: 2022-05-19 | Discharge: 2022-05-19 | Disposition: A | Discharge status: Home / Self Care | Payer: Medicare PPO | Attending: Emergency Medicine | Admitting: Emergency Medicine

## 2022-05-19 DIAGNOSIS — M25572 Pain in left ankle and joints of left foot: Secondary | ICD-10-CM

## 2022-05-19 DIAGNOSIS — R739 Hyperglycemia, unspecified: Secondary | ICD-10-CM | POA: Insufficient documentation

## 2022-05-19 DIAGNOSIS — Y9301 Activity, walking, marching and hiking: Secondary | ICD-10-CM | POA: Diagnosis not present

## 2022-05-19 DIAGNOSIS — Z5900 Homelessness unspecified: Secondary | ICD-10-CM | POA: Insufficient documentation

## 2022-05-19 DIAGNOSIS — F29 Unspecified psychosis not due to a substance or known physiological condition: Secondary | ICD-10-CM | POA: Diagnosis not present

## 2022-05-19 DIAGNOSIS — I10 Essential (primary) hypertension: Secondary | ICD-10-CM | POA: Diagnosis not present

## 2022-05-19 LAB — BASIC METABOLIC PANEL
Anion gap: 7 (ref 5–15)
BUN: 9 mg/dL (ref 6–20)
CO2: 28 mmol/L (ref 22–32)
Calcium: 9.2 mg/dL (ref 8.9–10.3)
Chloride: 99 mmol/L (ref 98–111)
Creatinine, Ser: 0.73 mg/dL (ref 0.61–1.24)
GFR, Estimated: >60 mL/min (ref >60)
Glucose, Bld: 223 mg/dL — ABNORMAL HIGH (ref 70–99)
Potassium: 3.8 mmol/L (ref 3.5–5.1)
Sodium: 134 mmol/L — ABNORMAL LOW (ref 135–145)

## 2022-05-19 LAB — CBC WITH DIFFERENTIAL/PLATELET
Abs Immature Granulocytes: 0.02 10*3/uL (ref 0.00–0.07)
Basophils Absolute: 0 10*3/uL (ref 0.0–0.1)
Basophils Relative: 0 %
Eosinophils Absolute: 0 10*3/uL (ref 0.0–0.5)
Eosinophils Relative: 1 %
HCT: 43 % (ref 39.0–52.0)
Hemoglobin: 14.2 g/dL (ref 13.0–17.0)
Immature Granulocytes: 0 %
Lymphocytes Relative: 14 %
Lymphs Abs: 1 10*3/uL (ref 0.7–4.0)
MCH: 28.7 pg (ref 26.0–34.0)
MCHC: 33 g/dL (ref 30.0–36.0)
MCV: 86.9 fL (ref 80.0–100.0)
Monocytes Absolute: 0.4 10*3/uL (ref 0.1–1.0)
Monocytes Relative: 5 %
Neutro Abs: 5.6 10*3/uL (ref 1.7–7.7)
Neutrophils Relative %: 80 %
Platelets: 202 10*3/uL (ref 150–400)
RBC: 4.95 MIL/uL (ref 4.22–5.81)
RDW: 13.7 % (ref 11.5–15.5)
WBC: 7.1 10*3/uL (ref 4.0–10.5)
nRBC: 0 % (ref 0.0–0.2)

## 2022-05-19 NOTE — ED Notes (Addendum)
Pt has ripped out his IV, pt was informed not to by previous RN and by other staff members.

## 2022-05-19 NOTE — ED Provider Triage Note (Signed)
Emergency Medicine Provider Triage Evaluation Note  Roberto Osborn , a 52 y.o. male  was evaluated in triage.  Pt complains of not feeling well bilateral ankle pain.  Patient states he just got released from jail and could not get into the [employer] so he walks the streets and started feeling unwell, states his ankles hurt from walking and he came to the emergency room.  Noted in triage that he is hypothermic.  Asking for sandwich  Review of Systems  Positive: Bilateral ankle pain Negative: Fever, chills, vomiting, abdominal pain  Physical Exam  BP (!) 156/105   Pulse (!) 111   Temp (!) 94.7 F (34.8 C) (Rectal)   Resp 16   SpO2 100%  Gen:   Awake, no distress   Resp:  Normal effort  MSK:   Moves extremities without difficulty  Other:    Medical Decision Making  Medically screening exam initiated at 6:39 PM.  Appropriate orders placed.  [name] was informed that the remainder of the evaluation will be completed by another provider, this initial triage assessment does not replace that evaluation, and the importance of remaining in the ED until their evaluation is complete.     Gwenevere Abbot, [place] 05/19/22 1840

## 2022-05-19 NOTE — Progress Notes (Signed)
CSW has added shelters to this patient's chart

## 2022-05-19 NOTE — ED Triage Notes (Signed)
Pt states that he was discharged from jail today and was then walking around on the streets due to housing insecurity. Pt was turned away from a few shelters and his feet began hurting from walking too much. Pt states this pain is chronic and had no new injury/trauma causing the pain.

## 2022-05-19 NOTE — Progress Notes (Addendum)
Transition of Care [facility]) - Emergency Department Mini Assessment   Patient Details  Name: Roberto Osborn MRN: [id number] Date of Birth: [birth year]  Transition of Care [facility] CM/SW Contact:    Rodney Booze, LCSW Phone Number: 05/19/2022, 8:15 PM   Clinical Narrative: Patient is homeless, CSW added shelters and provided patient with 2 hoodies and a shirt. AT this time we currently do not have any jackets.   ED Mini Assessment: What brought you to the Emergency Department? : (P) Patient is homeless has no where to go, Hit head.  Barriers to Discharge: (P) No Barriers Identified  Barrier interventions: (P) Homeless no coat  Means of departure: (P) Not know  Interventions which prevented an admission or readmission: Homeless Screening    Patient Contact and Communications        ,                 Admission diagnosis:  Foot Pain Patient Active Problem List   Diagnosis Date Noted   Severe recurrent major depressive disorder with psychotic features ([place]) 01/24/2022   Homelessness unspecified 08/31/2021   Psychotic disorder ([place]) 08/31/2021   Major depressive disorder, recurrent severe without psychotic features ([place]) 08/27/2021   Adjustment disorder 02/25/2021   Homelessness 02/25/2021   Major depressive disorder 02/25/2021   PCP:  Pcp, No Pharmacy:   [place] [address] Phone: [phone] Fax: [phone]

## 2022-05-19 NOTE — ED Notes (Signed)
Pt discharged with [employer] and security present.

## 2022-05-19 NOTE — ED Notes (Signed)
Bair hugger placed.

## 2022-05-19 NOTE — ED Notes (Signed)
Pt at nurses station belligerently yelling at tech, telling her that there is a nurse that was mean to him at a previous visit. He was stating that it is her job to get that nurse (unknown who or if she is even here tonight) to leave. Pt was able to be calmed with the offer of food.

## 2022-05-19 NOTE — ED Provider Notes (Signed)
Emergency Department Provider Note   I have reviewed the triage vital signs and the nursing notes.   HISTORY  Chief Complaint Homeless, Foot Pain, and Cold Exposure   HPI Roberto Osborn is a 52 y.o. male with past history of homelessness presents to the emergency department with left ankle pain.  He was released from jail today and spent approximately 2 hours walking around.  He is chronically homeless.  He tells me he has stayed in the [place] area for most of his life.  He does not have family or friends locally.  He does not have clothes or other supplies. He tells me he tried going to [employer] but they declined to keep him.  He has stayed at the [facility] in the past but has developed pain in his feet and especially his left ankle making the walk difficult.  He ultimately called EMS for transportation to the emergency department. Denies falls or recent head trauma.     Past Medical History:  Diagnosis Date   Homeless     Review of Systems  Constitutional: No fever/chills Cardiovascular: Denies chest pain. Respiratory: Denies shortness of breath. Gastrointestinal: No abdominal pain.  No nausea, no vomiting.  No diarrhea.  No constipation. Genitourinary: Negative for dysuria. Musculoskeletal: Negative for back pain. Positive left ankle pain.  Skin: Negative for rash. Neurological: Negative for headaches.  ____________________________________________   PHYSICAL EXAM:  VITAL SIGNS: ED Triage Vitals  Enc Vitals Group     BP 05/19/22 1830 (!) 156/105     Pulse Rate 05/19/22 1830 (!) 111     Resp 05/19/22 1830 16     Temp 05/19/22 1838 (!) 94.7 F (34.8 C)     Temp Source 05/19/22 1838 Rectal     SpO2 05/19/22 1830 100 %   Constitutional: Alert and oriented. Well appearing and in no acute distress. Eyes: Conjunctivae are normal.  Head: Atraumatic. Nose: No congestion/rhinnorhea. Mouth/Throat: Mucous membranes are moist.   Neck: No stridor.    Cardiovascular: Normal rate, regular rhythm. Good peripheral circulation. Grossly normal heart sounds.   Respiratory: Normal respiratory effort.  No retractions. Lungs CTAB. Gastrointestinal: Soft and nontender. No distention.  Musculoskeletal: No swelling or severe deformity to the left ankle.  Normal range of motion in the bilateral lower extremities.  Neurologic:  Normal speech and language. No gross focal neurologic deficits are appreciated.  Skin:  Skin is warm, dry and intact. No rash noted. Psychiatric: Mood and affect are normal. Speech and behavior are normal.  ____________________________________________   LABS (all labs ordered are listed, but only abnormal results are displayed)  Labs Reviewed  BASIC METABOLIC PANEL - Abnormal; Notable for the following components:      Result Value   Sodium 134 (*)    Glucose, Bld 223 (*)    All other components within normal limits  CBC WITH DIFFERENTIAL/PLATELET   ____________________________________________  RADIOLOGY  DG Ankle Complete Left  Result Date: 05/19/2022 CLINICAL DATA:  Left ankle pain EXAM: LEFT ANKLE COMPLETE - 3 VIEW COMPARISON:  None Available. FINDINGS: There is no evidence of fracture, dislocation, or joint effusion. Moderate degenerative changes of the tibiotalar joint. Small plantar and Achilles calcaneal spurs. The soft tissues are unremarkable. IMPRESSION: No acute fracture or dislocation. Electronically Signed   By: Beryle Flock M.D.   On: 05/19/2022 20:17    ____________________________________________   PROCEDURES  Procedure(s) performed:   Procedures  None  ____________________________________________   INITIAL IMPRESSION / ASSESSMENT AND PLAN /  ED COURSE  Pertinent labs & imaging results that were available during my care of the patient were reviewed by me and considered in my medical decision making (see chart for details).   This patient is Presenting for Evaluation of ankle pain, which  does require a range of treatment options, and is a complaint that involves a moderate risk of morbidity and mortality.  The Differential Diagnoses include fracture, dislocation, sprain, contusion, etc.  Clinical Laboratory Tests Ordered, included CBC without leukocytosis or anemia.  No acute kidney injury.  He has mild hyperglycemia without evidence of DKA.   Radiologic Tests Ordered, included ankle x-ray. I independently interpreted the images and agree with radiology interpretation.   Social Determinants of Health Risk patient with chronic homelessness.   Consult complete with Case mgmt for homeless resources.   Medical Decision Making: Summary:  Presents to the emergency department with pain in the left ankle after walking for 2 hours after being released from jail.  I do not appreciate any obvious deformity left ankle.  Will obtain imaging with report of pain but low suspicion for fracture.  Will hope to provide area resources to the patient the time of discharge.  On arrival, patient's temp is 94.7.  He has on the [employer] and warm blankets.  He is awake and alert.   Reevaluation with update and discussion with patient. Coats/layers provided. Info regarding area shelters provided. Patient is ambulatory.   Patient's presentation is most consistent with acute illness / injury with system symptoms.   Disposition: discharge  ____________________________________________  FINAL CLINICAL IMPRESSION(S) / ED DIAGNOSES  Final diagnoses:  Homelessness  Acute left ankle pain    Note:  This document was prepared using Dragon voice recognition software and may include unintentional dictation errors.  Nanda Quinton, MD, [facility] Emergency Medicine    Devota Viruet, [facility], MD 05/19/22 [phone]

## 2022-05-21 ENCOUNTER — Emergency Department (HOSPITAL_COMMUNITY)
Admission: EM | Admit: 2022-05-21 | Discharge: 2022-05-23 | Disposition: A | Discharge status: Home / Self Care | Payer: Medicare PPO | Attending: Emergency Medicine | Admitting: Emergency Medicine

## 2022-05-21 ENCOUNTER — Emergency Department (HOSPITAL_COMMUNITY): Payer: Medicare PPO

## 2022-05-21 DIAGNOSIS — H05231 Hemorrhage of right orbit: Secondary | ICD-10-CM | POA: Diagnosis not present

## 2022-05-21 DIAGNOSIS — Z5900 Homelessness unspecified: Secondary | ICD-10-CM

## 2022-05-21 DIAGNOSIS — R9431 Abnormal electrocardiogram [ECG] [EKG]: Secondary | ICD-10-CM | POA: Diagnosis not present

## 2022-05-21 DIAGNOSIS — S0285XA Fracture of orbit, unspecified, initial encounter for closed fracture: Secondary | ICD-10-CM

## 2022-05-21 DIAGNOSIS — F333 Major depressive disorder, recurrent, severe with psychotic symptoms: Secondary | ICD-10-CM | POA: Insufficient documentation

## 2022-05-21 DIAGNOSIS — H27131 Posterior dislocation of lens, right eye: Secondary | ICD-10-CM | POA: Diagnosis not present

## 2022-05-21 DIAGNOSIS — H05239 Hemorrhage of unspecified orbit: Secondary | ICD-10-CM

## 2022-05-21 DIAGNOSIS — Z79899 Other long term (current) drug therapy: Secondary | ICD-10-CM | POA: Diagnosis not present

## 2022-05-21 DIAGNOSIS — S0591XA Unspecified injury of right eye and orbit, initial encounter: Secondary | ICD-10-CM | POA: Diagnosis not present

## 2022-05-21 DIAGNOSIS — S0590XA Unspecified injury of unspecified eye and orbit, initial encounter: Secondary | ICD-10-CM | POA: Diagnosis present

## 2022-05-21 DIAGNOSIS — H2710 Unspecified dislocation of lens: Secondary | ICD-10-CM | POA: Diagnosis not present

## 2022-05-21 DIAGNOSIS — S0231XA Fracture of orbital floor, right side, initial encounter for closed fracture: Secondary | ICD-10-CM | POA: Diagnosis not present

## 2022-05-21 DIAGNOSIS — H0520 Unspecified exophthalmos: Secondary | ICD-10-CM | POA: Diagnosis not present

## 2022-05-21 DIAGNOSIS — F062 Psychotic disorder with delusions due to known physiological condition: Secondary | ICD-10-CM | POA: Diagnosis not present

## 2022-05-21 DIAGNOSIS — F29 Unspecified psychosis not due to a substance or known physiological condition: Secondary | ICD-10-CM

## 2022-05-21 LAB — CBC WITH DIFFERENTIAL/PLATELET
Abs Immature Granulocytes: 0.02 10*3/uL (ref 0.00–0.07)
Basophils Absolute: 0 10*3/uL (ref 0.0–0.1)
Basophils Relative: 0 %
Eosinophils Absolute: 0 10*3/uL (ref 0.0–0.5)
Eosinophils Relative: 0 %
HCT: 37.4 % — ABNORMAL LOW (ref 39.0–52.0)
Hemoglobin: 13 g/dL (ref 13.0–17.0)
Immature Granulocytes: 0 %
Lymphocytes Relative: 17 %
Lymphs Abs: 1.3 10*3/uL (ref 0.7–4.0)
MCH: 29 pg (ref 26.0–34.0)
MCHC: 34.8 g/dL (ref 30.0–36.0)
MCV: 83.5 fL (ref 80.0–100.0)
Monocytes Absolute: 0.7 10*3/uL (ref 0.1–1.0)
Monocytes Relative: 9 %
Neutro Abs: 5.6 10*3/uL (ref 1.7–7.7)
Neutrophils Relative %: 74 %
Platelets: 206 10*3/uL (ref 150–400)
RBC: 4.48 MIL/uL (ref 4.22–5.81)
RDW: 14 % (ref 11.5–15.5)
WBC: 7.7 10*3/uL (ref 4.0–10.5)
nRBC: 0 % (ref 0.0–0.2)

## 2022-05-21 LAB — RAPID URINE DRUG SCREEN, HOSP PERFORMED
Amphetamines: NOT DETECTED
Barbiturates: NOT DETECTED
Benzodiazepines: NOT DETECTED
Cocaine: NOT DETECTED
Opiates: NOT DETECTED
Tetrahydrocannabinol: NOT DETECTED

## 2022-05-21 LAB — BASIC METABOLIC PANEL
Anion gap: 8 (ref 5–15)
BUN: 9 mg/dL (ref 6–20)
CO2: 27 mmol/L (ref 22–32)
Calcium: 9.1 mg/dL (ref 8.9–10.3)
Chloride: 102 mmol/L (ref 98–111)
Creatinine, Ser: 0.73 mg/dL (ref 0.61–1.24)
GFR, Estimated: >60 mL/min (ref >60)
Glucose, Bld: 99 mg/dL (ref 70–99)
Potassium: 3.9 mmol/L (ref 3.5–5.1)
Sodium: 137 mmol/L (ref 135–145)

## 2022-05-21 LAB — ETHANOL: Alcohol, Ethyl (B): <10 mg/dL (ref <10)

## 2022-05-21 MED ORDER — HALOPERIDOL LACTATE 5 MG/ML IJ SOLN: 5.0000 mg | Freq: Once | INTRAMUSCULAR | Status: DC

## 2022-05-21 MED ORDER — STERILE WATER FOR INJECTION IJ SOLN
INTRAMUSCULAR | Status: AC
  Administered 2022-05-21: 10 mL
  Filled 2022-05-21: qty 10

## 2022-05-21 MED ORDER — HALOPERIDOL LACTATE 5 MG/ML IJ SOLN
5.0000 mg | Freq: Once | INTRAMUSCULAR | Status: AC
  Administered 2022-05-21: 5 mg via INTRAMUSCULAR
  Filled 2022-05-21: qty 1

## 2022-05-21 MED ORDER — SODIUM CHLORIDE 0.9 % IV BOLUS
1000.0000 mL | Freq: Once | INTRAVENOUS | Status: AC
  Administered 2022-05-21: 1000 mL via INTRAVENOUS

## 2022-05-21 MED ORDER — RISPERIDONE 1 MG PO TABS
1.0000 mg | ORAL_TABLET | Freq: Every day | ORAL | Status: DC
  Administered 2022-05-21 – 2022-05-22 (×2): 1 mg via ORAL
  Filled 2022-05-21 (×5): qty 1

## 2022-05-21 MED ORDER — ZIPRASIDONE MESYLATE 20 MG IM SOLR
20.0000 mg | Freq: Once | INTRAMUSCULAR | Status: AC
  Administered 2022-05-21: 20 mg via INTRAMUSCULAR
  Filled 2022-05-21: qty 20

## 2022-05-21 MED ORDER — CITALOPRAM HYDROBROMIDE 10 MG PO TABS
10.0000 mg | ORAL_TABLET | Freq: Every day | ORAL | Status: DC
  Administered 2022-05-21 – 2022-05-23 (×3): 10 mg via ORAL
  Filled 2022-05-21 (×3): qty 1
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
## 2022-05-21 NOTE — ED Notes (Signed)
Pt refused blood work. PA made aware.
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
## 2022-05-21 NOTE — Consult Note (Signed)
Attempted to see patient for psychiatric assessment via tts cart.  Was informed by nurse, Kevan Rosebush, patient received medications and is currently asleep.  She will alert tts when pt is awake and available for assessment.

## 2022-05-21 NOTE — ED Notes (Signed)
IVC paperwork Completed, original in red Magistrate folder, 1 copy labeled and in medrec drawer, 3 copies on purple clipboard in orange zone NS station until patient is moved.
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
## 2022-05-21 NOTE — ED Notes (Signed)
Pt's BP in the 80s. PA made aware. Fluids ordered.
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
## 2022-05-22 DIAGNOSIS — F333 Major depressive disorder, recurrent, severe with psychotic symptoms: Secondary | ICD-10-CM | POA: Diagnosis not present

## 2022-05-22 MED ORDER — IBUPROFEN 400 MG PO TABS
600.0000 mg | ORAL_TABLET | Freq: Four times a day (QID) | ORAL | Status: DC | PRN
  Administered 2022-05-22: 600 mg via ORAL
  Filled 2022-05-22: qty 1

## 2022-05-22 NOTE — ED Notes (Signed)
TTS machine at bedside. 

## 2022-05-22 NOTE — ED Notes (Signed)
Gave pt cranberry juice, PB and crackers

## 2022-05-22 NOTE — ED Notes (Signed)
This RN attempted to reposition pt for a proper BP measurement reading. However, pt refusing to roll to supine position to recheck BP. Typically pt's SBP 90s whenever laying flat.
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
## 2022-05-23 ENCOUNTER — Other Ambulatory Visit: Payer: Self-pay

## 2022-05-23 ENCOUNTER — Encounter (HOSPITAL_COMMUNITY): Payer: Self-pay

## 2022-05-23 DIAGNOSIS — F333 Major depressive disorder, recurrent, severe with psychotic symptoms: Secondary | ICD-10-CM

## 2022-05-23 NOTE — ED Notes (Signed)
Patient is wandering around in yellow zone. Attempted to reassure patient and ask if he needs anything. Patient ignored this tech.

## 2022-05-23 NOTE — Progress Notes (Signed)
Patient was provided with homeless resources and clothing.

## 2022-05-23 NOTE — Discharge Instructions (Addendum)
Emergency [facility] [place], [place] [address] [phone]  Monday-Friday Hours  8:00 AM-3:00 PM 8:00 PM- 8:00 AM    It was a pleasure caring for you today in the emergency department.  Please return to the emergency department for any worsening or worrisome symptoms.

## 2022-05-23 NOTE — ED Notes (Signed)
Ambulatory in room. Gait steady. Requests shower. Will wait for staff to assist. No signs of distress. Calm and cooperative.

## 2022-05-23 NOTE — ED Provider Notes (Signed)
  Provider Note MRN:  [id number]  Arrival date & time: 05/23/22    ED Course and Medical Decision Making    See note from prior team for complete details, in brief:    Plan per prior physician   Patient arrived for psychiatric evaluation, homeless, depressed.  Facial injury. He was cleared by psychiatry, medically cleared by prior team. IVC rescinded, stable for discharge. Patient follow-up with ophthalmology as an outpatient Given o/p resources for f/u Not psychotic, no si or hi, IVC rescinded  Stable for dc w/ o/p ophtho f/u and pcp f/u   The patient improved significantly and was discharged in stable condition. Detailed discussions were had with the patient regarding current findings, and need for close f/u with PCP or on call doctor. The patient has been instructed to return immediately if the symptoms worsen in any way for re-evaluation. Patient verbalized understanding and is in agreement with current care plan. All questions answered prior to discharge.    Procedures  Final Clinical Impressions(s) / ED Diagnoses     ICD-10-CM   1. Psychosis, unspecified psychosis type ([place])  F29     2. Lens dislocation  H27.10     3. Retrobulbar hematoma  H05.239     4. Closed fracture of orbit, initial encounter [facility])  S02.85XA       ED Discharge Orders     None         Discharge Instructions      Emergency [facility] [place], [place] [address] [phone]  Monday-Friday Hours  8:00 AM-3:00 PM 8:00 PM- 8:00 AM    It was a pleasure caring for you today in the emergency department.  Please return to the emergency department for any worsening or worrisome symptoms.        Jeanell Sparrow, DO 05/23/22 2024

## 2022-05-23 NOTE — ED Notes (Signed)
Patient is now back in room. Sitter is at bedside at this time.
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
## 2022-05-23 NOTE — ED Notes (Signed)
IVC paperwork will remain in orange zone pt is now in room 38 yellow zone nurse tiffany is aware

## 2022-05-23 NOTE — ED Notes (Signed)
Patient alerts and oriented x4 at this time, denies SI/HI, calm and cooperative at this time.

## 2022-05-23 NOTE — ED Notes (Signed)
Patient alerts and oriented x4, ambulatory with steady, calm and cooperative at this time. He denies SI/HI at this time, waiting for ED provider to sign discharge.

## 2022-05-23 NOTE — ED Notes (Signed)
Pt's right eye is ecchymotic and has 1+ swelling. Pt's sclera has subconjunctival hemorrhage. Pt states he has some blurred vision in right eye

## 2022-05-23 NOTE — ED Notes (Signed)
Prepared patient for shower. Provided patient with tolietries, scrubs, towels, and wash cloths. Will walk over to [facility] shower room.

## 2022-05-23 NOTE — Progress Notes (Signed)
Pt has been psych cleared per Merlyn Lot, NP. This CSW will now remove pt from the [facility] shift report. TOC will assist with any discharge needs.   Benjaman Kindler, MSW, [facility] 05/23/2022 8:30 PM

## 2022-05-26 ENCOUNTER — Emergency Department (HOSPITAL_BASED_OUTPATIENT_CLINIC_OR_DEPARTMENT_OTHER)
Admission: EM | Admit: 2022-05-26 | Discharge: 2022-05-26 | Disposition: A | Discharge status: Home / Self Care | Payer: Medicare PPO | Attending: Emergency Medicine | Admitting: Emergency Medicine

## 2022-05-26 ENCOUNTER — Ambulatory Visit (HOSPITAL_COMMUNITY)
Admission: EM | Admit: 2022-05-26 | Discharge: 2022-05-26 | Disposition: A | Discharge status: Home / Self Care | Payer: Medicare PPO

## 2022-05-26 ENCOUNTER — Encounter (HOSPITAL_BASED_OUTPATIENT_CLINIC_OR_DEPARTMENT_OTHER): Payer: Self-pay

## 2022-05-26 ENCOUNTER — Other Ambulatory Visit: Payer: Self-pay

## 2022-05-26 DIAGNOSIS — S0083XA Contusion of other part of head, initial encounter: Secondary | ICD-10-CM | POA: Diagnosis not present

## 2022-05-26 DIAGNOSIS — I10 Essential (primary) hypertension: Secondary | ICD-10-CM | POA: Diagnosis not present

## 2022-05-26 DIAGNOSIS — H1131 Conjunctival hemorrhage, right eye: Secondary | ICD-10-CM | POA: Diagnosis not present

## 2022-05-26 DIAGNOSIS — S0590XA Unspecified injury of unspecified eye and orbit, initial encounter: Secondary | ICD-10-CM | POA: Diagnosis not present

## 2022-05-26 DIAGNOSIS — S0993XA Unspecified injury of face, initial encounter: Secondary | ICD-10-CM | POA: Diagnosis present

## 2022-05-26 MED ORDER — ACETAMINOPHEN 325 MG PO TABS
650.0000 mg | ORAL_TABLET | Freq: Once | ORAL | Status: AC
  Administered 2022-05-26: 650 mg via ORAL
  Filled 2022-05-26: qty 2

## 2022-05-26 NOTE — ED Notes (Signed)
Late entry -- pt ambulatory to and from hall bathroom independently - gait steady/no distress.

## 2022-05-26 NOTE — ED Notes (Signed)
Pt agreeable with d/c plan as discussed by provider - this nurse has verbally reinforced d/c instructions and provided written copy.  Pt ambulatory at d/c independently with steady gait - no acute changes; no distress noted.
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
## 2022-05-26 NOTE — ED Notes (Signed)
Pt ambulatory to exam room from waiting room escorted by staff nurse.  Pt fully awake and alert -- ambulatory independently with steady gait - no obvious distress noted.  Pt observed to drinking gatorade in lobby; well toleraing PO intake.
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
## 2022-05-26 NOTE — ED Notes (Signed)
Pt again to and from hall bathroom independently; no acute changes distress.  Provided mac n cheese meal with peanut crackers, ginger ale and apple juice per request.  Will continue to maintain plan of care and monitor for acute changes
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
## 2022-05-26 NOTE — ED Notes (Signed)
R eye periorbital edema with bruising noted with erythematous sclera -- pt maintains ability to visually track.

## 2022-05-26 NOTE — ED Notes (Signed)
Pt reporting he is still hungry - well tolerated food and liquids provided earlier-- has been provided bbq chips, coke and peanut butter crackers at this time.  Pt now sitting up eating awaiting dispo.

## 2022-05-27 ENCOUNTER — Emergency Department (HOSPITAL_COMMUNITY)
Admission: EM | Admit: 2022-05-27 | Discharge: 2022-05-27 | Disposition: A | Discharge status: Home / Self Care | Payer: Medicare PPO | Attending: Emergency Medicine | Admitting: Emergency Medicine

## 2022-05-27 ENCOUNTER — Other Ambulatory Visit: Payer: Self-pay

## 2022-05-27 ENCOUNTER — Ambulatory Visit (INDEPENDENT_AMBULATORY_CARE_PROVIDER_SITE_OTHER)
Admission: EM | Admit: 2022-05-27 | Discharge: 2022-05-27 | Disposition: A | Discharge status: Home / Self Care | Payer: Medicare PPO | Source: Home / Self Care | Attending: Behavioral Health | Admitting: Behavioral Health

## 2022-05-27 DIAGNOSIS — S0011XD Contusion of right eyelid and periocular area, subsequent encounter: Secondary | ICD-10-CM | POA: Insufficient documentation

## 2022-05-27 DIAGNOSIS — S0591XD Unspecified injury of right eye and orbit, subsequent encounter: Secondary | ICD-10-CM

## 2022-05-27 DIAGNOSIS — Z765 Malingerer [conscious simulation]: Secondary | ICD-10-CM

## 2022-05-27 DIAGNOSIS — X58XXXD Exposure to other specified factors, subsequent encounter: Secondary | ICD-10-CM | POA: Insufficient documentation

## 2022-05-27 DIAGNOSIS — Z5902 Unsheltered homelessness: Secondary | ICD-10-CM | POA: Insufficient documentation

## 2022-05-27 DIAGNOSIS — Z5900 Homelessness unspecified: Secondary | ICD-10-CM

## 2022-05-27 MED ORDER — TETRACAINE HCL 0.5 % OP SOLN
2.0000 [drp] | Freq: Once | OPHTHALMIC | Status: AC
  Administered 2022-05-27: 2 [drp] via OPHTHALMIC
  Filled 2022-05-27: qty 4

## 2022-05-27 NOTE — ED Notes (Signed)
Pt refusing visual acuity screening at this time d/t "I'm tired; do it later". Explained to pt that this is the Emergency Department and if he wants [place] to help him by assessing his eye, he has to cooperate. Pt remains uncooperative. Notified MD.

## 2022-05-27 NOTE — ED Notes (Signed)
Pt refusing VS reassessment

## 2022-05-27 NOTE — Discharge Instructions (Addendum)
Discharge recommendations:   Outpatient Follow up: Please review list of outpatient resources for psychiatry and counseling. Please follow up with your primary care provider for all medical related needs.   You are encouraged to follow up with [facility] for outpatient treatment.  Walk in/ Open Access Hours: Monday - Friday 8AM - 11AM (To see provider and therapist) Friday - [place] (To see therapist only)  [facility] [place], [place]  Housing: Please see list of local shelters in the area/community.   Therapy: We recommend that patient participate in individual therapy to address mental health concerns.  Safety:   The following safety precautions should be taken:   No sharp objects. This includes scissors, razors, scrapers, and putty knives.   Chemicals should be removed and locked up.   Medications should be removed and locked up.   Weapons should be removed and locked up. This includes firearms, knives and instruments that can be used to cause injury.   The patient should abstain from use of illicit substances/drugs and abuse of any medications.  If symptoms worsen or do not continue to improve or if the patient becomes actively suicidal or homicidal then it is recommended that the patient return to the closest hospital emergency department, the [facility], or call 911 for further evaluation and treatment. National Suicide Prevention Lifeline 1-800-SUICIDE or 670-546-1965.  About 988 988 offers 24/7 access to trained crisis counselors who can help people experiencing mental health-related distress. People can call or text 988 or chat 988lifeline.org for themselves or if they are worried about a loved one who may need crisis support.
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
## 2022-05-27 NOTE — ED Triage Notes (Signed)
Pt presents to [facility] voluntarily, accompanied by [employer] at this time. Pt entered the building agitated and being disrespectful to staff. Pt states he would like assistance with housing, food, and clothing. Pt provided with food, clean clothing and offered materials for a shower. Pt currently eating food that was provided. Pt denies SI/HI and AVH.

## 2022-05-27 NOTE — ED Notes (Signed)
Discharge instructions provided and Pt stated understanding. Pt alert, orient and ambulatory prior to d/c from facility. Personal belongings returned. Safety maintained.
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
## 2022-05-27 NOTE — ED Notes (Signed)
Pt uncooperative when this RN tried to assess pt. When I asked pt what he was here for he said he was sick. I tried to get pt to elaborate and pt became more irritable.

## 2022-05-27 NOTE — ED Notes (Signed)
Pt refused vitals and refused to get out of the bed. Pt screaming at staff. Security notified and escorted pt to the lobby.
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
## 2022-05-28 ENCOUNTER — Emergency Department (HOSPITAL_COMMUNITY)
Admission: EM | Admit: 2022-05-28 | Discharge: 2022-05-28 | Disposition: A | Discharge status: Home / Self Care | Payer: Medicare PPO | Attending: Emergency Medicine | Admitting: Emergency Medicine

## 2022-05-28 ENCOUNTER — Other Ambulatory Visit: Payer: Self-pay

## 2022-05-28 ENCOUNTER — Ambulatory Visit (INDEPENDENT_AMBULATORY_CARE_PROVIDER_SITE_OTHER)
Admission: EM | Admit: 2022-05-28 | Discharge: 2022-05-28 | Disposition: A | Discharge status: Home / Self Care | Payer: Medicare PPO | Source: Home / Self Care

## 2022-05-28 DIAGNOSIS — Z5900 Homelessness unspecified: Secondary | ICD-10-CM

## 2022-05-28 DIAGNOSIS — Z765 Malingerer [conscious simulation]: Secondary | ICD-10-CM | POA: Insufficient documentation

## 2022-05-28 DIAGNOSIS — R454 Irritability and anger: Secondary | ICD-10-CM | POA: Diagnosis not present

## 2022-05-28 DIAGNOSIS — H1131 Conjunctival hemorrhage, right eye: Secondary | ICD-10-CM | POA: Diagnosis not present

## 2022-05-28 MED ORDER — ACETAMINOPHEN 325 MG PO TABS
650.0000 mg | ORAL_TABLET | Freq: Once | ORAL | Status: AC
  Administered 2022-05-28: 650 mg via ORAL
  Filled 2022-05-28: qty 2

## 2022-05-28 NOTE — ED Notes (Signed)
  Patient given sandwich and sprite.
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
## 2022-05-28 NOTE — ED Triage Notes (Signed)
Pt presents to [facility] voluntarily seeking housing assistance. Pt states he would like assistance with housing, food, and clothing. Pt offered food, and additional clothing. Pt refused, stating he would like a place to live.Pt denies SI/HI and AVH.

## 2022-05-28 NOTE — Discharge Instructions (Signed)
Please follow up with your eye doctor. 
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
## 2022-05-28 NOTE — ED Notes (Signed)
Patient was discharged by the provider. Patient was given an AVS with resources.

## 2022-05-28 NOTE — Discharge Instructions (Addendum)
Therapy Walk-in Hours  Monday-Wednesday: 8 AM until slots are full  Friday: 1 PM to 5 PM  For Monday to Wednesday, it is recommended that patients arrive between 7:30 AM and 7:45 AM because patients will be seen in the order of arrival.  For Friday, we ask that patients arrive between 12 PM to 12:30 PM.  Go to the second floor on arrival and check in.  Availability is limited; therefore, patients may not be seen on the same day.  Medication management walk-ins:  Monday to Friday: 8 AM to 11 AM.  It is recommended that patients arrive by 7:30 AM to 7:45 AM because patients will be seen in the order of arrival.  Go to the second floor on arrival and check in.  **Availability is limited; therefore, patients may not be seen on the same day.**  
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
## 2022-05-30 ENCOUNTER — Encounter (HOSPITAL_COMMUNITY): Payer: Self-pay | Admitting: *Deleted

## 2022-05-30 ENCOUNTER — Emergency Department (HOSPITAL_COMMUNITY)
Admission: EM | Admit: 2022-05-30 | Discharge: 2022-05-30 | Disposition: A | Discharge status: Home / Self Care | Payer: Medicare PPO | Attending: Emergency Medicine | Admitting: Emergency Medicine

## 2022-05-30 ENCOUNTER — Other Ambulatory Visit: Payer: Self-pay

## 2022-05-30 DIAGNOSIS — H5789 Other specified disorders of eye and adnexa: Secondary | ICD-10-CM | POA: Insufficient documentation

## 2022-05-30 DIAGNOSIS — Z5900 Homelessness unspecified: Secondary | ICD-10-CM | POA: Diagnosis not present

## 2022-05-30 DIAGNOSIS — R5383 Other fatigue: Secondary | ICD-10-CM | POA: Diagnosis not present

## 2022-05-30 DIAGNOSIS — R059 Cough, unspecified: Secondary | ICD-10-CM | POA: Diagnosis not present

## 2022-05-30 NOTE — ED Provider Notes (Signed)
[facility] Provider Note  CSN: [id number] Arrival date & time: 05/30/22 0401  Chief Complaint(s) Just tired from being on streets  HPI Roberto Osborn is a 52 y.o. male with a past medical history listed below who presents to the emergency department seeking shelter from the cold.  He has no acute complaints.  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 Authorizing Provider  citalopram (CELEXA) 10 MG tablet Take 1 tablet (10 mg total) by mouth daily. Patient not taking: Reported on 01/23/2022 08/28/21 01/23/22  Regan Lemming, MD  risperiDONE (RISPERDAL) 1 MG tablet Take 1 tablet (1 mg total) by mouth at bedtime. Patient not taking: Reported on 01/23/2022 08/28/21 01/23/22  Regan Lemming, MD                                                                                                                                    Allergies Haloperidol, Risperdal [risperidone], and Trazodone  Review of Systems Review of Systems As noted in HPI  Physical Exam Vital Signs  I have reviewed the triage vital signs BP 120/87 (BP Location: Left Arm)   Pulse (!) 113   Temp 97.7 F (36.5 C) (Oral)   Resp 18   SpO2 95%   Physical Exam Vitals reviewed.  Constitutional:      General: He is not in acute distress.    Appearance: He is  well-developed. He is not diaphoretic.  HENT:     Head: Normocephalic and atraumatic.     Right Ear: External ear normal.     Left Ear: External ear normal.     Nose: Nose normal.     Mouth/Throat:     Mouth: Mucous membranes are moist.  Eyes:     General: No scleral icterus.    Conjunctiva/sclera:     Right eye: Hemorrhage present.  Neck:     Trachea: Phonation normal.  Cardiovascular:     Rate and Rhythm: Normal rate and regular rhythm.  Pulmonary:     Effort: Pulmonary effort is normal. No respiratory distress.     Breath sounds: No stridor.  Abdominal:     General: There is no distension.  Musculoskeletal:        General: Normal range of motion.     Cervical back: Normal range  of motion.  Neurological:     Mental Status: He is alert and oriented to person, place, and time.  Psychiatric:        Behavior: Behavior normal.     ED Results and Treatments Labs (all labs ordered are listed, but only abnormal results are displayed) Labs Reviewed - No data to display                                                                                                                       EKG  EKG Interpretation  Date/Time:    Ventricular Rate:    PR Interval:    QRS Duration:   QT Interval:    QTC Calculation:   R Axis:     Text Interpretation:         Radiology No results found.  Medications Ordered in ED Medications - No data to display                                                                                                                                   Procedures Procedures  (including critical care time)  Medical Decision Making / ED Course   Medical Decision Making   Homeless patient seeking shelter without acute complaints.  On exam patient has subconjunctival hemorrhage of the right eye seen on prior visits.      Final Clinical Impression(s) / ED Diagnoses Final diagnoses:  Homeless   The patient appears reasonably screened and/or  stabilized for discharge and I doubt any other medical condition or other [facility] requiring further screening, evaluation, or treatment in the ED at this time. I have discussed the findings, Dx and Tx plan with the patient/family who expressed understanding and agree(s) with the plan. Discharge instructions discussed at length. The patient/family was given strict return precautions who verbalized understanding of the instructions. No further questions at time of discharge.  Disposition: Discharge  Condition: Good  ED Discharge Orders     None        Follow Up: No follow-up provider specified.          This chart was dictated using voice recognition software.  Despite best efforts to proofread,  errors can occur which can change the documentation meaning.    Fatima Blank, MD 05/30/22 [phone]

## 2022-05-30 NOTE — ED Triage Notes (Signed)
Pt came to back EMS door and was tired of being out in cold. Pt states he is just tired of being on the streets. Denies pain

## 2022-06-01 ENCOUNTER — Ambulatory Visit (HOSPITAL_COMMUNITY)
Admission: EM | Admit: 2022-06-01 | Discharge: 2022-06-01 | Disposition: A | Discharge status: Home / Self Care | Payer: Medicare PPO | Attending: Psychiatry | Admitting: Psychiatry

## 2022-06-01 ENCOUNTER — Ambulatory Visit (INDEPENDENT_AMBULATORY_CARE_PROVIDER_SITE_OTHER)
Admission: EM | Admit: 2022-06-01 | Discharge: 2022-06-02 | Disposition: A | Discharge status: Home / Self Care | Payer: Medicare PPO | Source: Home / Self Care

## 2022-06-01 DIAGNOSIS — F1210 Cannabis abuse, uncomplicated: Secondary | ICD-10-CM

## 2022-06-01 DIAGNOSIS — R630 Anorexia: Secondary | ICD-10-CM | POA: Insufficient documentation

## 2022-06-01 DIAGNOSIS — R440 Auditory hallucinations: Secondary | ICD-10-CM | POA: Insufficient documentation

## 2022-06-01 DIAGNOSIS — R451 Restlessness and agitation: Secondary | ICD-10-CM

## 2022-06-01 DIAGNOSIS — Z765 Malingerer [conscious simulation]: Secondary | ICD-10-CM | POA: Diagnosis not present

## 2022-06-01 DIAGNOSIS — Z5900 Homelessness unspecified: Secondary | ICD-10-CM

## 2022-06-01 DIAGNOSIS — F17210 Nicotine dependence, cigarettes, uncomplicated: Secondary | ICD-10-CM | POA: Diagnosis not present

## 2022-06-01 NOTE — Discharge Instructions (Addendum)
Homeless Shelter List:  [facility] [place], [place] [address] [address]) [place], [place] Phone: [phone]  [facility] [address], [place], [place] [address] Phone: [phone]  [name] (Women only) [address] [place], [place] [address] Phone: [place], [place] [address] Phone: [phone]  [place]: [phone]. [address] [place], [place] [address] Phone: [phone]  [facility] [address], [place], [place] [address] (Check in at 6:00PM for placement at a local shelter) Phone: [phone]

## 2022-06-01 NOTE — ED Triage Notes (Signed)
Pt presents to [facility] voluntarily, accompanied by [employer] at this time. Pt appears to be responding to internal stimuli and is very fidgety. Per [employer], pt was kicked out of a [employer] and they were called. Pt is experiencing homelessness and was seen earlier today and discharged from [facility]. Pt reports smoking marijuana today, but unsure of amount used. Pt denies SI, HI.

## 2022-06-01 NOTE — Discharge Instructions (Addendum)
[place] Address: [place], [place], [place] [address] Phone: [phone] Website: View Here Hours: [place]: 8:30am-5:30pm  Description: Clarinda is a Physiological scientist that works with individuals and families at any stage of the continuum from homelessness to homeownership.  ______________________________________________________________________________________________ [facility] [place] Address: [address], [place], [place] [address] Phone: [phone] Website: View Here Hours: Unknown  [place] Address: [place]. [place], [place] [address] Phone: [phone] Website: View Here Hours: Unknown  Description: Emergency Rental Assistance Program (COVID-19 related); [facility] referrals  ________________________________________________________________________________________________________ [facility] Address: [address] [place] [address] Phone: [phone] Website: View Here Hours: M-F: 8:00am-3:00pm  Description: Case management, mental health and medical clinic, showers, lockers, ID assistance, mailing address and mailboxes, laundry, referral assistance, resume and job application assistance, professional Careers information officer, phone access.  ____________________________________________________________________________________________________ [facility] Address: [address], [place], [place] [address] Phone: [phone] Website: View Here Hours: Wednesday: 12:00-7:00pm Friday: 11:00am-2:00pm Saturday: 9:00am-12:00pm  You can also go to the [employer] for free meals.  Lunch is at 10:30am - 12:30pm.  [facility] [place], [place], [address] [phone] phone  Offers food and emergency or transitional housing to men, women, or families in need. Clients participate in programs and workshops developed  to promote self-sufficiency and personal development.Call or walk in. Applications are accepted Monday, Wednesday, and Friday by appointment only. Need photo ID and proof of income.  [place] [address] [place], [place] [address] [phone] Population served: Adult men & women (57 years old and older, able to perform activities for daily living) Documents required: Valid ID & [place] [address] [place], [place]  [address] [phone] Population served: Families with children  [place] [address], [place], [place]  [address] [phone] Population served: Single women 18+ without dependents Documents required:  [place] Card  [facility] [address] [place], [place]  [address] [phone] Population served: Male veterans 18+ with substance abuse/mental health issues Eligibility: By referral only  [facility] [address], [place], [place] [address] [phone] Population served: Males 18+ Documents required: Valid ID & Social Security Card  Room at [employer] of the [place]. [address], [place] [address] [phone] or [phone] Population served: Pregnant women with or without children  Documents required: Valid ID & Social Security Banker of [employer] [address] [place], [place] [address] [phone] Population Served: Families with children  [facility] [place] [address] [place], [place] [address] [phone] Population served: Men 18+, preference for disabled and/or veterans Eligibility: By referral only  [place] [facility]) - Emergency Family Shelter [place], [place], [place] [address] [phone] or [phone] Population served: Families with children.       While we are unable to provide direct housing, financial, or social services, [facility] cares deeply for this community. We would be happy to connect you with other organizations in our community that may be able to provide assistance. We invite you to connect with [place] for spiritual support and prayer during difficult times. Our hearts are with you, and we hope these resources can help point you  in a positive direction. Please reach out if we can provide additional support.  [place] Address: [address], [place], [place] [address] Phone: [phone] Website: View Here Hours: 9am-4 pm. (closed 12:30 for lunch)  Description: You can come to [place] for help with your resume and cover letter. We help instruct individuals on how to apply for jobs online and give tips on how to prepare for interviews. We host hiring events every week and our helpful staff can work with you on a one-on-one basis.  [place] [employer] [facility] Address: [address] [place], [place] [address] Phone: [phone] Website: View Here Hours: M,T,TH: 9:00am-4:00pm W: 9:00am-6:00pm F: 9:00am-2:00pm  Description: [employer] is Museum/gallery exhibitions officer. Job seekers can Secretary/administrator for jobs, create resumes, and find education and training. Employers can find candidates, post jobs, and Hotel manager.  [facility] Address: [place], [place], [place] [address] Phone: [phone] Website: View Here Hours: M-F: 8:30am-5pm  Description: Donah Driver is a 43 (c)(3) organization that provides free job readiness training, active mentoring, and supportive services to help individuals find and keep jobs and build stable lives.  [employer] Address: [address] [place], [place] [address] Phone: [phone] Website: View Here Hours: M-F: 8:30am-5pm  Description: Employment program for formerly incarcerated Cytogeneticist Location: Address: [place] [phone]  Phone: [phone] Website: View Here Hours: Unknown  [place] Location: Address: [address], [place] [address] Phone: [phone] Website: View Here Hours: Unknown  Description: Support of employment for individuals with a disability   Acupuncturist  Men's Division [address] [place], [address]  Women and [place], [place], [address]  [phone] phone ((Ask to be transferred to either the men or women's division for the service that you want.))   Important Requirements for All Programs...  Be 98yrs or older. You will be restricted to the property for the first seven days as part of acclimation. Cannot be a sex offender/convicted sex offender/registered sex offender. Be able to take care of yourself without assistance. Be your own legal guardian.  If not, the legal guardian will be responsible for 30% of the expenses. Be willing and able to work. Be willing and able to attend church 4x/week - Tuesday, Thursday, and 2x on Sunday. Be willing and able to attend morning devotions five days each week. Cannot have an outside job for the first seven months. 10.  Cannot have a car on the property for twelve months.

## 2022-06-01 NOTE — ED Provider Notes (Signed)
Behavioral Health Urgent Care Medical Screening Exam  Patient Name: Roberto Osborn MRN: [id number] Date of Evaluation: 06/01/22 Chief Complaint:   Diagnosis:  Final diagnoses:  Marijuana abuse  Homelessness  Malingering    History of Present illness: Roberto Osborn is a 52 y.o. male. With a history of homelessness, psychosis, schizoaffective disorder, suicidal ideation, behavioral disturbance.  Presented to [facility] via [employer].  Patient was seen here at  [facility] early on today and was discharge patient does have a history of numerous ER visits within a short period of time, patient is homeless and at this point seems to be malingering.  According to patient he was brought here because of disturbance on the street.  Patient stated that he got some marijuana tonight from a friend.  Patient appears to be influenced by internal stimuli.  Patient did not agree historian at this point, not able to answer questions appropriately.  However he did not deny SI, HI.  Patient Is unkept.  When asked if he is currently taking medication patient stated no however it is unclear if patient is telling the truth.   Face-to-face observation of patient, patient is alert to himself and place, speech is clear to mumbled at times, patient appears to be very irritable.  Patient presented with word salad, however the patient denies SI, HI, when asked patient reports hallucinations saying he is seeing pain and blood.  Patient does seem to be external stimuli.  Patient denies alcohol use, however patient stated he had some marijuana from a friend tonight and stated he was brought here because of disturbance on the street.  When asked if he was goal patient stated he just felt alcohol to go.    Recommend discharge the patient to follow-up with [facility] shelter.    [place] ED from 06/01/2022 in [facility] ED from 05/30/2022 in [facility] Emergency Department at [facility] ED from 05/28/2022  in [facility] Emergency Department at [place] No Risk No Risk No Risk       Psychiatric Specialty Exam  Presentation  General Appearance:Disheveled  Eye Contact:Fair  Speech:Garbled  Speech Volume:Decreased  Handedness:Right   Mood and Affect  Mood: Irritable  Affect: Congruent   Thought Process  Thought Processes: Linear  Descriptions of Associations:Intact  Orientation:Full (Time, Place and Person)  Thought Content:Illogical  Diagnosis of Schizophrenia or Schizoaffective disorder in past: No  Duration of Psychotic Symptoms: Greater than six months  Hallucinations:Visual seeing blood and pain  Ideas of Reference:None  Suicidal Thoughts:No Without Plan  Homicidal Thoughts:No   Sensorium  Memory: Immediate Fair  Judgment: Intact  Insight: Shallow   Executive Functions  Concentration: Poor  Attention Span: Fair  Recall: [place] of Knowledge: Fair  Language: Fair   Psychomotor Activity  Psychomotor Activity: Normal   Assets  Assets: Armed forces logistics/support/administrative officer; Housing; Social Support; Resilience   Sleep  Sleep: Fair  Number of hours:  7   Physical Exam: Physical Exam HENT:     Head: Normocephalic.     Nose: Nose normal.  Cardiovascular:     Rate and Rhythm: Tachycardia present.  Musculoskeletal:        General: Normal range of motion.     Cervical back: Normal range of motion.  Skin:    General: Skin is warm.  Neurological:     General: No focal deficit present.     Mental Status: He is alert.  Psychiatric:  Mood and Affect: Mood normal.        Behavior: Behavior normal.        Thought Content: Thought content normal.        Judgment: Judgment normal.    Review of Systems  Constitutional: Negative.   HENT: Negative.    Eyes: Negative.   Respiratory: Negative.    Cardiovascular: Negative.   Genitourinary: Negative.   Musculoskeletal: Negative.   Skin:  Negative.   Neurological: Negative.   Psychiatric/Behavioral:  Positive for substance abuse. The patient is nervous/anxious.    Blood pressure (!) 158/92, pulse (!) 107, temperature 98.2 F (36.8 C), temperature source Oral, resp. rate 20, SpO2 98 %. There is no height or weight on file to calculate BMI.  Musculoskeletal: Strength & Muscle Tone: within normal limits Gait & Station: normal Patient leans: N/A   [place] MSE Discharge Disposition for Follow up and Recommendations: Based on my evaluation the patient does not appear to have an emergency medical condition and can be discharged with resources and follow up care in outpatient services for Substance Abuse Intensive Outpatient Program and Individual Therapy   Evette Georges, NP 06/01/2022, 10:41 PM

## 2022-06-01 NOTE — Progress Notes (Signed)
   06/01/22 1151  [place] (Walk-ins at [facility] only)  How Did You Hear About [place]? Legal System  What Is the Reason for Your Visit/Call Today? AH; Pt presents to [facility] voluntarily and via [employer].  Pt denies SI, HI or [place].  Pt reports hearing voices, "the voices are telling me to die".  Pt reports that he cannot keep his pants up, people are bothering him, "I am homeless, I don't have no where to stay".  Pt denies any prior MH diagnosis or precribed medication  for symtom managment.  How Long Has This Been Causing You Problems? 1 wk - 1 month  Have You Recently Had Any Thoughts About Hurting Yourself? No  Are You Planning to Commit Suicide/Harm Yourself At This time? No  Have you Recently Had Thoughts About [place]? No  Are You Planning To Harm Someone At This Time? No  Are you currently experiencing any auditory, visual or other hallucinations? Yes  Please explain the hallucinations you are currently experiencing: Pt reports hearing voices "the vocies are telling me to die"  Have You Used Any Alcohol or Drugs in the Past 24 Hours? Yes  How long ago did you use Drugs or Alcohol? 1 24oz can beer  What Did You Use and How Much? beer  Do you have any current medical co-morbidities that require immediate attention? No  Clinician description of patient physical appearance/behavior: Pt was dishelved and unkempt.  What Do You Feel Would Help You the Most Today? Treatment for Depression or other mood problem;Support for unsafe relationship;Stress Management;Housing Assistance  If access to [facility] Urgent Care was not available, would you have sought care in the Emergency Department? Yes  Determination of Need Urgent (48 hours)  Options For Referral Outpatient Therapy

## 2022-06-01 NOTE — ED Provider Notes (Signed)
Behavioral Health Urgent Care Medical Screening Exam  Patient Name: Roberto Osborn MRN: [id number] Date of Evaluation: 06/01/22 Chief Complaint: "Don't mess this up for me. I need a place to eat, sleep, watch some TV" Diagnosis:  Final diagnoses:  Homeless   History of Present illness: Roberto Osborn is a 52 y.o. male. Pt presents voluntarily to [facility] behavioral health for walk-in assessment. Pt is escorted by [employer]. Pt is assessed face-to-face by nurse practitioner.   Roberto Osborn, 52 y.o., male patient seen face to face by this provider, consulted with Dr. Dwyane Dee; and chart reviewed on 06/01/22.  Per chart review, pt has had multiple ED/UC visits recently, including on 05/30/22 ([facility]), 05/28/22 ([place], [place]), 05/27/22 ([place], [place]), 05/26/22 ([facility], [place]), 05/21/22 ([facility]), 05/19/22 ([place]).   On evaluation, when asked reason for presenting today, Roberto Osborn reports, "Don't mess this up for me. I need a place to eat, sleep, watch some TV". When asked how pt came to present with police, he reports some "butthole" called police after he went into restaurants asking for money and water. He states police were called, picked him up, and brought him to this facility.   Pt reports irritable mood. He endorses poor appetite and sleep. He denies suicidal, homicidal or violent ideations. He denies current auditory visual hallucinations, although states he does experience auditory hallucinations. He states he hears "bad things". When asked how long he has been experiencing auditory hallucinations, he states "since I was born". Objectively, there is no evidence pt is responding to internal stimuli. He denies paranoia.  Pt states he uses alcohol, "not much". He last had 1 24 oz beer "couple days ago". He reports use of cigarettes. He states he smokes "too many" cigarettes, although does not quantify an amount. He reports last cigarette was today. He denies marijuana, crack/cocaine, opioid,  methamphetamine, other substance use.  Pt reports he is currently homeless. When asked where he has been staying, he states "nowhere".   He denies access to a firearm or other weapon.  He reports he is currently retired. States he was previously employed at [employer].  He reports he is not connected with counseling or medication management. Discussed open access hours at [facility].   Pt discharged with shelter resources.   [place] ED from 06/01/2022 in [facility] ED from 05/30/2022 in [facility] Emergency Department at [facility] ED from 05/28/2022 in [facility] Emergency Department at [place] No Risk No Risk No Risk       Psychiatric Specialty Exam  Presentation  General Appearance:Disheveled  Eye Contact:Fair  Speech:Clear and Coherent; Normal Rate  Speech Volume:Normal  Handedness:Right   Mood and Affect  Mood: Irritable  Affect: Congruent   Thought Process  Thought Processes: Coherent; Goal Directed; Linear  Descriptions of Associations:Intact  Orientation:Full (Time, Place and Person)  Thought Content:WDL  Diagnosis of Schizophrenia or Schizoaffective disorder in past: No  Duration of Psychotic Symptoms: Greater than six months  Hallucinations:None none currently, heard things  Ideas of Reference:None  Suicidal Thoughts:No  Homicidal Thoughts:No   Sensorium  Memory: Immediate Fair  Judgment: Intact  Insight: Shallow   Executive Functions  Concentration: Fair  Attention Span: Fair  Recall: [place] of Knowledge: Fair  Language: Fair   Psychomotor Activity  Psychomotor Activity: Normal   Assets  Assets: Armed forces logistics/support/administrative officer; Desire for Improvement; Leisure Time; Resilience   Sleep  Sleep: Poor  Number of  hours:  7   Physical Exam: Physical Exam Constitutional:      General: He is not in acute distress.    Appearance: He is not  ill-appearing, toxic-appearing or diaphoretic.  Eyes:     General: No scleral icterus.       Right eye: No discharge.        Left eye: No discharge.     Extraocular Movements: Extraocular movements intact.     Comments: Right subconjunctival hemmorhage  Cardiovascular:     Rate and Rhythm: Tachycardia present.  Pulmonary:     Effort: Pulmonary effort is normal. No respiratory distress.  Neurological:     Mental Status: He is alert and oriented to person, place, and time.  Psychiatric:        Attention and Perception: Attention and perception normal.        Mood and Affect: Affect is angry.        Speech: Speech normal.        Behavior: Behavior is agitated and combative.        Thought Content: Thought content normal.    Review of Systems  Constitutional:  Negative for chills and fever.  Respiratory:  Negative for shortness of breath.   Cardiovascular:  Negative for chest pain and palpitations.  Gastrointestinal:  Negative for abdominal pain.  Neurological:  Negative for headaches.   Blood pressure (!) 155/94, pulse (!) 101, resp. rate 18, SpO2 99 %. There is no height or weight on file to calculate BMI.  Musculoskeletal: Strength & Muscle Tone: within normal limits Gait & Station: normal Patient leans: N/A   [facility] MSE Discharge Disposition for Follow up and Recommendations: Based on my evaluation the patient does not appear to have an emergency medical condition and can be discharged with resources and follow up care in outpatient services for shelter resources   Tharon Aquas, NP 06/01/2022, 12:37 PM

## 2022-06-01 NOTE — ED Notes (Signed)
[employer] called for transport to [facility].

## 2022-06-02 DIAGNOSIS — F1210 Cannabis abuse, uncomplicated: Secondary | ICD-10-CM | POA: Diagnosis not present

## 2022-06-02 DIAGNOSIS — Z765 Malingerer [conscious simulation]: Secondary | ICD-10-CM

## 2022-06-02 DIAGNOSIS — Z5900 Homelessness unspecified: Secondary | ICD-10-CM | POA: Diagnosis not present

## 2022-06-02 DIAGNOSIS — R630 Anorexia: Secondary | ICD-10-CM | POA: Diagnosis not present

## 2022-06-02 DIAGNOSIS — R440 Auditory hallucinations: Secondary | ICD-10-CM | POA: Diagnosis not present

## 2022-06-02 DIAGNOSIS — F17210 Nicotine dependence, cigarettes, uncomplicated: Secondary | ICD-10-CM | POA: Diagnosis not present

## 2022-06-02 NOTE — ED Notes (Signed)
Remains pending [employer] transport.

## 2022-06-02 NOTE — ED Notes (Signed)
[employer] called again for pickup.

## 2022-06-03 ENCOUNTER — Emergency Department (HOSPITAL_COMMUNITY)
Admission: EM | Admit: 2022-06-03 | Discharge: 2022-06-03 | Disposition: A | Discharge status: Home / Self Care | Payer: Medicare PPO | Attending: Emergency Medicine | Admitting: Emergency Medicine

## 2022-06-03 ENCOUNTER — Other Ambulatory Visit: Payer: Self-pay

## 2022-06-03 DIAGNOSIS — F259 Schizoaffective disorder, unspecified: Secondary | ICD-10-CM | POA: Diagnosis not present

## 2022-06-03 DIAGNOSIS — Z5900 Homelessness unspecified: Secondary | ICD-10-CM | POA: Diagnosis not present

## 2022-06-03 DIAGNOSIS — J00 Acute nasopharyngitis [common cold]: Secondary | ICD-10-CM | POA: Diagnosis not present

## 2022-06-03 MED ORDER — ACETAMINOPHEN 325 MG PO TABS
650.0000 mg | ORAL_TABLET | Freq: Once | ORAL | Status: AC
  Administered 2022-06-03: 650 mg via ORAL
  Filled 2022-06-03: qty 2

## 2022-06-03 NOTE — ED Triage Notes (Signed)
Pt presents to ER for being cold, tells this RN in triage that he has no belongings other than the clothes he is wearing. It is difficult to be sure whether this patient has additional complaints because his speech is pressured and tangential.   Denies SI/HI. Denies etOH. Endorses marijuana use  He denies pain. He states he is not currently on any medications.

## 2022-06-03 NOTE — ED Provider Notes (Signed)
[place] EMERGENCY DEPARTMENT AT [facility] Provider Note   CSN: [id number] Arrival date & time: 06/03/22  2213     History  Chief Complaint  Patient presents with   Homeless    Roberto Osborn is a 52 y.o. male.  HPI     52 year old male with history of homelessness, psychosis, schizoaffective disorder comes in with vague complaints.  Patient presents here through the waiting room by himself.  He states that he is currently homeless.  He came to the ER because he did not want to be on the street.  He wants something to eat.  He states that his birthday is coming up, and he needs to be ready to celebrate.  He denies any SI, HI.  He is afraid about the disturbance on the street.  Patient has been to the emergency room frequent times recently and was assessed by behavioral health urgent care just earlier.  He admits to marijuana use.  Denies any alcohol use.  Home Medications Prior to Admission medications   Medication Sig Start Date End Date Taking? Authorizing Provider  citalopram (CELEXA) 10 MG tablet Take 1 tablet (10 mg total) by mouth daily. Patient not taking: Reported on 01/23/2022 08/28/21 01/23/22  Regan Lemming, MD  risperiDONE (RISPERDAL) 1 MG tablet Take 1 tablet (1 mg total) by mouth at bedtime. Patient not taking: Reported on 01/23/2022 08/28/21 01/23/22  Regan Lemming, MD      Allergies    Haloperidol, Risperdal [risperidone], and Trazodone    Review of Systems   Review of Systems  All other systems reviewed and are negative.   Physical Exam Updated Vital Signs BP (!) 158/79 (BP Location: Left Arm)   Pulse (!) 108   Temp 97.6 F (36.4 C) (Oral)   Resp 18   Wt 88 kg   SpO2 98%   BMI 25.60 kg/m  Physical Exam Vitals and nursing note reviewed.  Constitutional:      Appearance: He is well-developed.  HENT:     Head: Atraumatic.  Cardiovascular:     Rate and Rhythm: Normal rate.  Pulmonary:     Effort: Pulmonary effort is normal.   Musculoskeletal:     Cervical back: Neck supple.  Skin:    General: Skin is warm.  Neurological:     Mental Status: He is alert and oriented to person, place, and time.  Psychiatric:     Comments: Word salad, responding to internal stimuli, hyperactive     ED Results / Procedures / Treatments   Labs (all labs ordered are listed, but only abnormal results are displayed) Labs Reviewed - No data to display  EKG None  Radiology No results found.  Procedures Procedures    Medications Ordered in ED Medications  acetaminophen (TYLENOL) tablet 650 mg (has no administration in time range)    ED Course/ Medical Decision Making/ A&P                             Medical Decision Making Risk OTC drugs.   53 year old male comes in with chief complaint of feeling cold and worried about the disturbance on the street.  He is not able to provide comprehensive history, but he is directable.  He is thinking is tangential.  He has a word salad.  Patient denies any SI, HI.  He has been into the emergency room frequently over the last few days and was just at behavioral health  urgent care earlier today.  Patient has no SI, HI.  At this time he does not need additional emergent workup.  He does not need involuntary commitment for his chronic psychosis.  Final Clinical Impression(s) / ED Diagnoses Final diagnoses:  Homelessness    Rx / DC Orders ED Discharge Orders     None         Varney Biles, MD 06/03/22 2245

## 2022-06-08 ENCOUNTER — Emergency Department (HOSPITAL_COMMUNITY)
Admission: EM | Admit: 2022-06-08 | Discharge: 2022-06-08 | Disposition: A | Discharge status: Home / Self Care | Payer: Medicare PPO | Attending: Emergency Medicine | Admitting: Emergency Medicine

## 2022-06-08 ENCOUNTER — Other Ambulatory Visit: Payer: Self-pay

## 2022-06-08 DIAGNOSIS — Z5900 Homelessness unspecified: Secondary | ICD-10-CM | POA: Insufficient documentation

## 2022-06-08 DIAGNOSIS — R509 Fever, unspecified: Secondary | ICD-10-CM | POA: Diagnosis not present

## 2022-06-08 DIAGNOSIS — J00 Acute nasopharyngitis [common cold]: Secondary | ICD-10-CM | POA: Insufficient documentation

## 2022-06-08 NOTE — Discharge Instructions (Signed)
Return to ED with any new or worsening symptoms Please follow-up with [facility] Please see attached resources

## 2022-06-08 NOTE — ED Triage Notes (Signed)
Patient arrived with EMS from street ( homeless) reports feeling cold this morning . Respirations unlabored /denies pain .

## 2022-06-08 NOTE — ED Notes (Signed)
Patient asleep at this time and loudly snoring. Patient has equal bilateral chest rise and fall Patient in no apparent distress.

## 2022-06-08 NOTE — ED Notes (Signed)
Patient verbally abusive with staff/RN  at waiting area .

## 2022-06-08 NOTE — ED Provider Notes (Signed)
[place] Provider Note   CSN: [id number] Arrival date & time: 06/08/22  0541     History  Chief Complaint  Patient presents with   "I'm Cold"     Roberto Osborn is a 52 y.o. male with medical history of homelessness, psychosis, schizoaffective disorder.  Patient presents to the ED with unclear complaint.  Patient reports he is homeless.  Per triage note, patient arrived with EMS from the street reporting feeling cold this morning.  Patient also noted to be verbally abusive to staff requesting sandwich.  On my initial examination, patient sleeping in no apparent distress on hospital bed in hallway.  Patient will wake when aroused however rolls back over and falls back asleep.  Patient woken and is requesting something to eat at this time, repeating "I am cold".  Patient denies medical complaints.  Patient alert and oriented x 3.  Patient seen in the ED 22 times in the last 6 months for various complaints.  Patient denies SI, HI, AVH.  Patient denies drug or alcohol use.  HPI     Home Medications Prior to Admission medications   Medication Sig Start Date End Date Taking? Authorizing Provider  citalopram (CELEXA) 10 MG tablet Take 1 tablet (10 mg total) by mouth daily. Patient not taking: Reported on 01/23/2022 08/28/21 01/23/22  Regan Lemming, MD  risperiDONE (RISPERDAL) 1 MG tablet Take 1 tablet (1 mg total) by mouth at bedtime. Patient not taking: Reported on 01/23/2022 08/28/21 01/23/22  Regan Lemming, MD      Allergies    Haloperidol, Risperdal [risperidone], and Trazodone    Review of Systems   Review of Systems  All other systems reviewed and are negative.   Physical Exam Updated Vital Signs BP (!) 143/95   Pulse (!) 105   Temp 97.8 F (36.6 C)   Resp 17   SpO2 95%  Physical Exam Vitals and nursing note reviewed.  Constitutional:      General: He is not in acute distress.    Appearance: He is not ill-appearing,  toxic-appearing or diaphoretic.  HENT:     Head: Normocephalic and atraumatic.     Mouth/Throat:     Mouth: Mucous membranes are moist.     Pharynx: Oropharynx is clear.  Eyes:     Extraocular Movements: Extraocular movements intact.     Conjunctiva/sclera: Conjunctivae normal.     Pupils: Pupils are equal, round, and reactive to light.  Cardiovascular:     Rate and Rhythm: Normal rate and regular rhythm.  Pulmonary:     Effort: Pulmonary effort is normal.     Breath sounds: Normal breath sounds. No wheezing.  Abdominal:     General: Abdomen is flat. Bowel sounds are normal.     Palpations: Abdomen is soft.     Tenderness: There is no abdominal tenderness.  Musculoskeletal:     Cervical back: Normal range of motion and neck supple. No tenderness.  Skin:    General: Skin is warm and dry.     Capillary Refill: Capillary refill takes less than 2 seconds.  Neurological:     General: No focal deficit present.     Mental Status: He is alert and oriented to person, place, and time.     GCS: GCS eye subscore is 4. GCS verbal subscore is 5. GCS motor subscore is 6.     Cranial Nerves: Cranial nerves 2-12 are intact. No cranial nerve deficit.  Sensory: Sensation is intact. No sensory deficit.     Motor: Motor function is intact. No weakness.     Coordination: Coordination is intact. Heel to [facility] Test normal.     ED Results / Procedures / Treatments   Labs (all labs ordered are listed, but only abnormal results are displayed) Labs Reviewed - No data to display  EKG None  Radiology No results found.  Procedures Procedures    Medications Ordered in ED Medications - No data to display  ED Course/ Medical Decision Making/ A&P  Medical Decision Making  52 year old male presents to ED for evaluation.  Please see HPI for further details.  On exam patient afebrile.  Patient lung sounds are clear bilaterally, not hypoxic.  Patient abdomen soft and compressible.  Neurological  examination without focal neurodeficits.  Patient seen here numerous times in the last 6 months.  Patient last seen on 2/2 requesting something to eat, denied medical complaints at this time.  Patient presents here today complaining of feeling cold and requesting something to eat.  Patient directable however cannot provide clear history.  He is alert and oriented x 3.  Patient denies SI, HI.  Patient been seen frequently in the ER over the last 6 months.  Patient has no SI, HI.  At this time patient does not require any additional emergent workup.  Patient does not require involuntary commitment for his chronic psychosis.  Patient provided food.  Patient allowed to rest in department for 3 hours.  Patient discharged in stable condition.  Final Clinical Impression(s) / ED Diagnoses Final diagnoses:  Homeless    Rx / DC Orders ED Discharge Orders     None         Lawana Chambers 06/08/22 1001    Fredia Sorrow, MD 06/12/22 1523

## 2022-06-08 NOTE — ED Notes (Addendum)
Patient verbally abusive to staff at nursing desk, patient stating "I just want a fucking sandwich" and continuously yelling and demanding staff to do as he wishes.

## 2022-07-16 ENCOUNTER — Other Ambulatory Visit: Payer: Self-pay

## 2022-07-16 ENCOUNTER — Encounter (HOSPITAL_COMMUNITY): Payer: Self-pay | Admitting: *Deleted

## 2022-07-16 ENCOUNTER — Emergency Department (HOSPITAL_COMMUNITY)
Admission: EM | Admit: 2022-07-16 | Discharge: 2022-07-17 | Disposition: A | Discharge status: Home / Self Care | Payer: Medicare PPO | Attending: Emergency Medicine | Admitting: Emergency Medicine

## 2022-07-16 DIAGNOSIS — F29 Unspecified psychosis not due to a substance or known physiological condition: Secondary | ICD-10-CM | POA: Diagnosis not present

## 2022-07-16 DIAGNOSIS — Z5900 Homelessness unspecified: Secondary | ICD-10-CM | POA: Insufficient documentation

## 2022-07-16 NOTE — ED Triage Notes (Signed)
I have no idea why the pt is here he has rapid speech and is talking non-stop  he doesnot answer questions  asked but carries on a conversation  regardless of what is asked just not relevant to his surrondings.

## 2022-07-17 NOTE — ED Provider Notes (Signed)
  [facility] Emergency Department Provider Note MRN:  [id number]  Arrival date & time: 07/17/22     Chief Complaint   Psychosis  History of Present Illness   Roberto Osborn is a 52 y.o. year-old male presents to the ED with chief complaint of requesting a sandwich and to wait in the lobby until his girlfriend comes to pick him up.  He has been seen in this ED multiple times for homelessness.  He has been seen several times by psychiatry as well and hasn't recently met criteria for inpatient treatment.  History provided by patient.   Review of Systems  Pertinent positive and negative review of systems noted in HPI.    Physical Exam   Vitals:   07/16/22 2311  BP: (!) 150/88  Pulse: (!) 103  Resp: 18  Temp: 98.2 F (36.8 C)  SpO2: 90%    CONSTITUTIONAL:  non toxic-appearing, NAD, random tangential thinking NEURO:  Alert and oriented x 3, CN 3-12 grossly intact, responding to internal stimuli EYES:  eyes equal and reactive ENT/NECK:  Supple, no stridor  CARDIO:  normal rate on my exam, regular rhythm, appears well-perfused  PULM:  No respiratory distress, CTAB GI/GU:  non-distended,  MSK/SPINE:  No gross deformities, no edema, moves all extremities  SKIN:  no rash, atraumatic   *Additional and/or pertinent findings included in MDM below  Diagnostic and Interventional Summary    EKG Interpretation  Date/Time:    Ventricular Rate:    PR Interval:    QRS Duration:   QT Interval:    QTC Calculation:   R Axis:     Text Interpretation:         Labs Reviewed - No data to display  No orders to display    Medications - No data to display   Procedures  /  Critical Care Procedures  ED Course and Medical Decision Making  I have reviewed the triage vital signs, the nursing notes, and pertinent available records from the EMR.  Social Determinants Affecting Complexity of Care: Patient is homelessness.   ED Course:    Medical Decision  Making Patient here requesting a sandwich and to wait in the lobby for his girlfriend.  Multiple prior visits for homelessness.  Multiple evaluations by psychiatry.  He doesn't seem to be a danger to himself or others.  I don't think he needs any emergent workup.  Will give him his requested sandwich and discharge.     Consultants: No consultations were needed in caring for this patient.   Treatment and Plan: Emergency department workup does not suggest an emergent condition requiring admission or immediate intervention beyond  what has been performed at this time. The patient is safe for discharge and has  been instructed to return immediately for worsening symptoms, change in  symptoms or any other concerns    Final Clinical Impressions(s) / ED Diagnoses     ICD-10-CM   1. Chronic psychosis ([place])  [place]       ED Discharge Orders     None         Discharge Instructions Discussed with and Provided to Patient:   Discharge Instructions   None      Montine Circle, PA-C 07/17/22 0006    Fatima Blank, MD 07/17/22 [phone]

## 2022-07-18 ENCOUNTER — Emergency Department (HOSPITAL_COMMUNITY)
Admission: EM | Admit: 2022-07-18 | Discharge: 2022-07-18 | Disposition: A | Discharge status: Home / Self Care | Payer: Medicare PPO | Attending: Emergency Medicine | Admitting: Emergency Medicine

## 2022-07-18 DIAGNOSIS — Z5900 Homelessness unspecified: Secondary | ICD-10-CM | POA: Diagnosis not present

## 2022-07-18 DIAGNOSIS — M542 Cervicalgia: Secondary | ICD-10-CM | POA: Insufficient documentation

## 2022-07-18 NOTE — ED Notes (Signed)
Pt refused to have vitals taken and he is refusing to be discharged, security called for assistance.

## 2022-07-18 NOTE — ED Triage Notes (Signed)
Patient states his neck hurts from laying on the concrete and that he has nowhere to go, asking for a sandwich during triage.

## 2022-07-18 NOTE — ED Provider Notes (Signed)
  [facility] Emergency Department Provider Note MRN:  [id number]  Arrival date & time: 07/18/22     Chief Complaint   Neck Pain   History of Present Illness   Roberto Osborn is a 52 y.o. year-old male presents to the ED with chief complaint of not wanting to be outside in the cold.  Also asks for a sandwich.  States he has nowhere to go.  I saw the patient recently.  He has been seen 18 times in the last 6 months.  States his neck was stiff from laying on the concrete.  History provided by patient.   Review of Systems  Pertinent positive and negative review of systems noted in HPI.    Physical Exam   Vitals:   07/18/22 0259  BP: 138/83  Pulse: 90  Resp: 16  Temp: 97.6 F (36.4 C)  SpO2: 99%    CONSTITUTIONAL:  non toxic-appearing, NAD NEURO:  Alert and oriented x 3 EYES:  eyes equal and reactive ENT/NECK:  Supple, no stridor  CARDIO:  normal rate, appears well-perfused  PULM:  No respiratory distress,  GI/GU:  non-distended,  MSK/SPINE:  No gross deformities, no edema, moves all extremities  SKIN:  no rash, atraumatic, sunburned skin on right arm   *Additional and/or pertinent findings included in MDM below  Diagnostic and Interventional Summary    Labs Reviewed - No data to display  No orders to display    Medications - No data to display   Procedures  /  Critical Care Procedures  ED Course and Medical Decision Making  I have reviewed the triage vital signs, the nursing notes, and pertinent available records from the EMR.  Social Determinants Affecting Complexity of Care: Patient is homelessness. Discussed patient's lack of housing.  Resources provided for homeless shelters.  ED Course:    Medical Decision Making Patient is homeless.  Wanted to get out of the cold.  Given a sandwich.  Will discharge once the buses start runnning.     Consultants: No consultations were needed in caring for this patient.   Treatment and  Plan: Emergency department workup does not suggest an emergent condition requiring admission or immediate intervention beyond  what has been performed at this time. The patient is safe for discharge and has  been instructed to return immediately for worsening symptoms, change in  symptoms or any other concerns    Final Clinical Impressions(s) / ED Diagnoses     ICD-10-CM   1. Homeless  Z59.00       ED Discharge Orders     None         Discharge Instructions Discussed with and Provided to Patient:   Discharge Instructions   None      Montine Circle, PA-C 07/18/22 0443    Regan Lemming, MD 07/18/22 [phone]

## 2022-07-21 ENCOUNTER — Ambulatory Visit (HOSPITAL_COMMUNITY)
Admission: EM | Admit: 2022-07-21 | Discharge: 2022-07-21 | Disposition: A | Discharge status: Home / Self Care | Payer: Medicare PPO | Attending: Family | Admitting: Family

## 2022-07-21 DIAGNOSIS — Z5900 Homelessness unspecified: Secondary | ICD-10-CM | POA: Diagnosis not present

## 2022-07-21 DIAGNOSIS — F329 Major depressive disorder, single episode, unspecified: Secondary | ICD-10-CM | POA: Insufficient documentation

## 2022-07-21 DIAGNOSIS — R451 Restlessness and agitation: Secondary | ICD-10-CM | POA: Diagnosis not present

## 2022-07-21 NOTE — Progress Notes (Signed)
   07/21/22 1540  [place] (Walk-ins at [facility] only)  What Is the Reason for Your Visit/Call Today? Pt is a 52 yo male who presented to the [facility] via [employer] voluntarily and unaccompanied. Pt seems very fidgety and restless and is mumbling, incoherentely at times. at first. Pt signed the waiver and then continued mu bling softly. At first, pt would not answer and question put to him. At times, pt slightly raised his voice and made large gestures with his hands still mumbling incoherently. At one poit, when asked about where he was picked up by police and where he is living pt begain to speak quickly but coherently saying he is sleeping on the ground outside. Pt asked to stay at the [facility]. Pt was told that there are not availalble beds at the moment but that we can arrange for him to go to the [facility]. Pt denied SI, HI, paranoia and any drug use. Pt did not appear to be responding to internal stimuli.  How Long Has This Been Causing You Problems? > than 6 months  Have You Recently Had Any Thoughts About Hurting Yourself? No  Are You Planning to Commit Suicide/Harm Yourself At This time? No  Have you Recently Had Thoughts About [place]? No  Are You Planning To Harm Someone At This Time? No  Are you currently experiencing any auditory, visual or other hallucinations?  (Pt was unable to respond due to restlessness and at times inattention.)  Please explain the hallucinations you are currently experiencing: Pt was unable to respond due to restlessness and at times inattention.  Have You Used Any Alcohol or Drugs in the Past 24 Hours? No (denied)  How long ago did you use Drugs or Alcohol? na  What Did You Use and How Much? na  Do you have any current medical co-morbidities that require immediate attention?  (Pt was unable to respond due to restlessness and at times inattention.)  Clinician description of patient physical appearance/behavior: Disheveled and unkempt, restless and uninterested   What Do You Feel Would Help You the Most Today?  (Pt was unable to respond due to restlessness and at times inattention.)  If access to [facility] Urgent Care was not available, would you have sought care in the Emergency Department?  (Pt was unable to respond due to restlessness and at times inattention.)  Determination of Need Routine (7 days) (Per Beatriz Stallion NP pt is psychictrically cleared.)  Options For Referral Outpatient Therapy

## 2022-07-21 NOTE — ED Notes (Addendum)
Patient was discharged by provider. Patient was given an AVS with community resources. Patient refused a bus pass.

## 2022-07-21 NOTE — ED Provider Notes (Signed)
Behavioral Health Urgent Care Medical Screening Exam  Patient Name: Roberto Osborn MRN: [id number] Date of Evaluation: 07/21/22 Chief Complaint:   Diagnosis:  Final diagnoses:  Homelessness    History of Present illness: Roberto Osborn is a 52 y.o. male.  Patient presents voluntarily to [facility] health, transported by Event organiser.   Patient is assessed, face-to-face, by nurse practitioner.  Chart reviewed and patient discussed with Dr. Hampton Abbot on 07/21/2022.  He is seated in observation area, no apparent distress.  He is alert and oriented to self place and situation.  Patient presents with irritable mood.  Initially patient presents with tangential conversation, rapid and pressured.  He states "I am tired of this crap, I am fucking crazy.  I am just tired!"  Patient does participate appropriately in interview.  He states "can I please stay here, I just want to lay down and I am going to behave, I do not have any place to stay."  Patient states he has been declined at [facility] and [facility] in past.  Does not offer details surrounding shelters.  He reports he typically sleeps outside.   Patient's diagnoses include major depressive disorder and psychotic disorder.  It appears that he is not linked with outpatient psychiatry currently.  He does not directly answer regarding current outpatient psychiatry provider or medications.  He endorses multiple previous inpatient psychiatric hospitalizations.  He denies suicidal and homicidal ideations.  He denies auditory and visual hallucinations.  Patient denies symptoms of paranoia.  Patient is currently homeless in [place].  He denies access to weapons.  He denies alcohol and substance use.  Patient offered support and encouragement.  He declines any person to contact for collateral information.  Patient becomes irritable when attempting to work through treatment plan to include shelter and other housing  options.  Patient declines collection of vital signs.  Upon discharge patient tosses after visit summary and bus pass onto ground and walks away.   [place] ED from 07/18/2022 in [facility] Emergency Department at [facility] ED from 07/16/2022 in [facility] Emergency Department at [facility] ED from 06/08/2022 in [facility] Emergency Department at [place] No Risk No Risk No Risk       Psychiatric Specialty Exam  Presentation  General Appearance:Disheveled  Eye Contact:Fair  Speech:Pressured  Speech Volume:Normal  Handedness:Right   Mood and Affect  Mood: Anxious  Affect: Congruent   Thought Process  Thought Processes: Coherent; Goal Directed  Descriptions of Associations:Intact  Orientation:Full (Time, Place and Person)  Thought Content:Logical  Diagnosis of Schizophrenia or Schizoaffective disorder in past: No  Duration of Psychotic Symptoms: Greater than six months  Hallucinations:None seeing blood and pain  Ideas of Reference:None  Suicidal Thoughts:No Without Plan  Homicidal Thoughts:No   Sensorium  Memory: Immediate Fair  Judgment: Intact  Insight: Shallow   Executive Functions  Concentration: Fair  Attention Span: Fair  Recall: Kenneth of Knowledge: Fair  Language: Fair   Psychomotor Activity  Psychomotor Activity: Increased   Assets  Assets: Communication Skills; Desire for Improvement; Resilience   Sleep  Sleep: Fair  Number of hours:  7   Physical Exam: Physical Exam Vitals and nursing note reviewed.  Constitutional:      Appearance: Normal appearance. He is well-developed and normal weight.  HENT:     Head: Normocephalic and atraumatic.     Nose: Nose normal.  Cardiovascular:  Rate and Rhythm: Normal rate.  Pulmonary:     Effort: Pulmonary effort is normal.  Musculoskeletal:        General: Normal range of motion.     Cervical  back: Normal range of motion.  Skin:    General: Skin is warm and dry.  Neurological:     Mental Status: He is alert and oriented to person, place, and time.  Psychiatric:        Attention and Perception: Attention normal.        Mood and Affect: Mood is anxious. Affect is labile.        Speech: Speech normal.        Behavior: Behavior is cooperative.        Thought Content: Thought content normal.        Cognition and Memory: Cognition normal.    Review of Systems  Constitutional: Negative.   HENT: Negative.    Eyes: Negative.   Respiratory: Negative.    Cardiovascular: Negative.   Gastrointestinal: Negative.   Genitourinary: Negative.   Musculoskeletal: Negative.   Skin: Negative.   Neurological: Negative.   Psychiatric/Behavioral: Negative.     There were no vitals taken for this visit. There is no height or weight on file to calculate BMI.  Musculoskeletal: Strength & Muscle Tone: within normal limits Gait & Station: normal Patient leans: N/A   [place] [facility] Discharge Disposition for Follow up and Recommendations: Based on my evaluation the patient does not appear to have an emergency medical condition and can be discharged with resources and follow up care in outpatient services for Medication Management and Individual Therapy Follow-up with outpatient psychiatry, resources provided.  Follow-up with additional housing resources provided.  Lucky Rathke, FNP 07/21/2022, 3:59 PM

## 2022-07-21 NOTE — Discharge Instructions (Addendum)

## 2022-07-26 ENCOUNTER — Emergency Department (HOSPITAL_COMMUNITY)
Admission: EM | Admit: 2022-07-26 | Discharge: 2022-07-27 | Disposition: A | Discharge status: Home / Self Care | Payer: Medicare PPO | Source: Home / Self Care | Attending: Emergency Medicine | Admitting: Emergency Medicine

## 2022-07-26 ENCOUNTER — Encounter (HOSPITAL_COMMUNITY): Payer: Self-pay | Admitting: Emergency Medicine

## 2022-07-26 ENCOUNTER — Emergency Department (HOSPITAL_COMMUNITY)
Admission: EM | Admit: 2022-07-26 | Discharge: 2022-07-26 | Disposition: A | Discharge status: Home / Self Care | Payer: Medicare PPO | Attending: Emergency Medicine | Admitting: Emergency Medicine

## 2022-07-26 ENCOUNTER — Other Ambulatory Visit: Payer: Self-pay

## 2022-07-26 DIAGNOSIS — Z5900 Homelessness unspecified: Secondary | ICD-10-CM | POA: Insufficient documentation

## 2022-07-26 DIAGNOSIS — M79605 Pain in left leg: Secondary | ICD-10-CM | POA: Insufficient documentation

## 2022-07-26 DIAGNOSIS — M791 Myalgia, unspecified site: Secondary | ICD-10-CM | POA: Insufficient documentation

## 2022-07-26 DIAGNOSIS — M79606 Pain in leg, unspecified: Secondary | ICD-10-CM | POA: Diagnosis not present

## 2022-07-26 DIAGNOSIS — M79604 Pain in right leg: Secondary | ICD-10-CM | POA: Insufficient documentation

## 2022-07-26 DIAGNOSIS — R6889 Other general symptoms and signs: Secondary | ICD-10-CM | POA: Diagnosis not present

## 2022-07-26 MED ORDER — ACETAMINOPHEN 325 MG PO TABS
650.0000 mg | ORAL_TABLET | Freq: Once | ORAL | Status: AC
  Administered 2022-07-26: 650 mg via ORAL
  Filled 2022-07-26: qty 2

## 2022-07-26 NOTE — Discharge Instructions (Addendum)
Your heart and lungs sound normal today.  You are not prescribed Depakote but you were given Tylenol for your pain.  You will need to follow-up outpatient with a primary care clinic.  I have attached 1 to these papers for you to see.  It was a pleasure to meet you and we hope you feel better

## 2022-07-26 NOTE — ED Triage Notes (Signed)
Pt bib [facility] pt is homeless and is c/o neck pain and "all over pain"

## 2022-07-26 NOTE — ED Triage Notes (Signed)
Pt bib [facility] from outside police station c/o bilateral leg pain ongoing for months. Denies any recent trauma or injury. Ambulatory in triage.   BP: 126/82, HR 90, Spo2 100%

## 2022-07-26 NOTE — ED Provider Notes (Signed)
[facility] Provider Note   CSN: [id number] Arrival date & time: 07/26/22  0210     History  Chief Complaint  Patient presents with   Leg Pain    Roberto Osborn is a 52 y.o. male presenting today with leg pain.  He tells the nurse that he has leg pain but when I asked him why he is here he says "I do not know can you read my chart."  He tells me he would like assistance with his life.  Says he is also in pain and would like a Depakote.   Leg Pain      Home Medications Prior to Admission medications   Medication Sig Start Date End Date Taking? Authorizing Provider  citalopram (CELEXA) 10 MG tablet Take 1 tablet (10 mg total) by mouth daily. Patient not taking: Reported on 01/23/2022 08/28/21 01/23/22  Regan Lemming, MD  risperiDONE (RISPERDAL) 1 MG tablet Take 1 tablet (1 mg total) by mouth at bedtime. Patient not taking: Reported on 01/23/2022 08/28/21 01/23/22  Regan Lemming, MD      Allergies    Haloperidol, Risperdal [risperidone], and Trazodone    Review of Systems   Review of Systems  Physical Exam Updated Vital Signs BP (!) 140/83 (BP Location: Right Arm)   Pulse 93   Temp 97.6 F (36.4 C) (Oral)   Resp 20   Ht 6\' 1"  (1.854 m)   Wt 90.7 kg   SpO2 100%   BMI 26.39 kg/m  Physical Exam Vitals and nursing note reviewed.  Constitutional:      Appearance: Normal appearance.  HENT:     Head: Normocephalic and atraumatic.  Eyes:     General: No scleral icterus.    Conjunctiva/sclera: Conjunctivae normal.  Cardiovascular:     Rate and Rhythm: Normal rate and regular rhythm.  Pulmonary:     Effort: Pulmonary effort is normal. No respiratory distress.     Breath sounds: No wheezing or rales.  Musculoskeletal:     Right lower leg: No edema.     Left lower leg: No edema.  Skin:    Findings: No rash.  Neurological:     Mental Status: He is alert.  Psychiatric:        Mood and Affect: Mood normal.     ED  Results / Procedures / Treatments   Labs (all labs ordered are listed, but only abnormal results are displayed) Labs Reviewed - No data to display  EKG None  Radiology No results found.  Procedures Procedures   Medications Ordered in ED Medications - No data to display  ED Course/ Medical Decision Making/ A&P                             Medical Decision Making  53 year old male presenting today with questionable leg pain?  He tells me that he does not know why he is here.  Patient has a history of depression, adjustment disorder and psychosis.  Also has a history of homelessness.  He requested a Depakote for his pain however he is not prescribed this medication.  He will be given a Tylenol and ultimately discharged as he does not appear to have an emergent condition.  Upon being notified that he will be discharged he says "we checked my heart and lungs."  Auscultation without any abnormalities.  Patient will be discharged  Final Clinical Impression(s) / ED Diagnoses Final  diagnoses:  Bilateral leg pain    Rx / DC Orders ED Discharge Orders     None      Results and diagnoses were explained to the patient. Return precautions discussed in full. Patient had no additional questions and expressed complete understanding.   This chart was dictated using voice recognition software.  Despite best efforts to proofread,  errors can occur which can change the documentation meaning.    Darliss Ridgel 07/26/22 0238    Quintella Reichert, MD 07/26/22 [phone]

## 2022-07-27 MED ORDER — ACETAMINOPHEN 325 MG PO TABS
650.0000 mg | ORAL_TABLET | Freq: Once | ORAL | Status: AC
  Administered 2022-07-27: 650 mg via ORAL
  Filled 2022-07-27: qty 2

## 2022-07-27 NOTE — ED Notes (Signed)
Pt ambulatory without assistance.  

## 2022-07-27 NOTE — ED Notes (Signed)
Pt required [employer] and [employer] Officer escort out of emergency department dt refusing to leave upon discharge.

## 2022-07-27 NOTE — ED Provider Notes (Signed)
[place] EMERGENCY DEPARTMENT AT [facility] Provider Note   CSN: [id number] Arrival date & time: 07/26/22  2034     History  Chief Complaint  Patient presents with   Generalized Body Aches    STEDMON HIBDON is a 52 y.o. male.  Patient with history of homelessness presents today requesting a place to sleep tonight.  He originally told the triage nurse that he was having bodyaches, however he denies any of that with me repeatedly.  He states that he just wanted a bed to sleep and in some food.  He denies any complaints.  No SI/HI, or AVH.  The history is provided by the patient. No language interpreter was used.       Home Medications Prior to Admission medications   Medication Sig Start Date End Date Taking? Authorizing Provider  citalopram (CELEXA) 10 MG tablet Take 1 tablet (10 mg total) by mouth daily. Patient not taking: Reported on 01/23/2022 08/28/21 01/23/22  Regan Lemming, MD  risperiDONE (RISPERDAL) 1 MG tablet Take 1 tablet (1 mg total) by mouth at bedtime. Patient not taking: Reported on 01/23/2022 08/28/21 01/23/22  Regan Lemming, MD      Allergies    Haloperidol, Risperdal [risperidone], and Trazodone    Review of Systems   Review of Systems  All other systems reviewed and are negative.   Physical Exam Updated Vital Signs BP (!) 141/85 (BP Location: Right Arm)   Pulse 82   Temp 98 F (36.7 C)   Resp 18   Ht 6\' 1"  (1.854 m)   Wt 90 kg   SpO2 92%   BMI 26.18 kg/m  Physical Exam Vitals and nursing note reviewed.  Constitutional:      General: He is not in acute distress.    Appearance: Normal appearance. He is normal weight. He is not ill-appearing, toxic-appearing or diaphoretic.  HENT:     Head: Normocephalic and atraumatic.  Cardiovascular:     Rate and Rhythm: Normal rate.  Pulmonary:     Effort: Pulmonary effort is normal. No respiratory distress.  Musculoskeletal:        General: Normal range of motion.     Cervical back:  Normal range of motion.  Skin:    General: Skin is warm and dry.  Neurological:     General: No focal deficit present.     Mental Status: He is alert.  Psychiatric:        Mood and Affect: Mood normal.        Behavior: Behavior normal.     ED Results / Procedures / Treatments   Labs (all labs ordered are listed, but only abnormal results are displayed) Labs Reviewed - No data to display  EKG None  Radiology No results found.  Procedures Procedures    Medications Ordered in ED Medications  acetaminophen (TYLENOL) tablet 650 mg (650 mg Oral Given 07/27/22 0143)    ED Course/ Medical Decision Making/ A&P                             Medical Decision Making Risk OTC drugs.   Patient presents today requesting a place to sleep tonight.  He is afebrile, nontoxic-appearing, and in no acute distress with reassuring vital signs.  Physical exam reveals no acute findings.  Chart review, patient has presented several times previously with similar requests.  He has not appeared to review responding to internal stimuli, denies SI/HI, AVH,  or any somatic complaints.  Patient given food and Tylenol per his request.  Resources given for shelters. Evaluation and diagnostic testing in the emergency department does not suggest an emergent condition requiring admission or immediate intervention beyond what has been performed at this time.  Plan for discharge with close PCP follow-up.  Patient is understanding and amenable with plan, educated on red flag symptoms that would prompt immediate return.  Patient discharged in stable condition.  Final Clinical Impression(s) / ED Diagnoses Final diagnoses:  Homelessness    Rx / DC Orders ED Discharge Orders     None     An After Visit Summary was printed and given to the patient.     Nestor Lewandowsky 07/27/22 [id number]    Palumbo, April, MD 07/27/22 [id number]

## 2022-07-27 NOTE — ED Notes (Signed)
Pt provided ham sandwich, saltines, water, refused vitals.

## 2022-07-28 ENCOUNTER — Telehealth: Payer: Self-pay

## 2022-07-28 ENCOUNTER — Emergency Department (HOSPITAL_COMMUNITY)
Admission: EM | Admit: 2022-07-28 | Discharge: 2022-07-28 | Disposition: A | Discharge status: Home / Self Care | Payer: Medicare PPO | Attending: Emergency Medicine | Admitting: Emergency Medicine

## 2022-07-28 DIAGNOSIS — M791 Myalgia, unspecified site: Secondary | ICD-10-CM | POA: Insufficient documentation

## 2022-07-28 DIAGNOSIS — Z765 Malingerer [conscious simulation]: Secondary | ICD-10-CM | POA: Insufficient documentation

## 2022-07-28 NOTE — Telephone Encounter (Signed)
        Patient  visited [facility] on 3/27     Telephone encounter attempt :  1st  Missing or Invalid Number     Roberto Osborn, Roberto Osborn [address], [place], [place] [address] Phone: [phone] Email: [email or web address]

## 2022-07-28 NOTE — ED Triage Notes (Signed)
[place] [facility] [place]. Hotel staff called EMS because patient was loitering/malingering in the lobby with no where to go.   Pt has a long standing psychiatric history and c/o generalized pain today. Pt is wearing the burgundy hospital scrub bottoms that were given to him the last time he was seen and treated here.   Pt endorses being hungry and is asking for food.

## 2022-07-28 NOTE — ED Triage Notes (Signed)
Unable to get through triage without patient demanding food. This Optometrist. Charge nurse, Darl Householder, at bedside to attempt to reason with patient. Pt continued to escalate, yelling and demanding food. Dr. Tyrone Nine made aware and witnessed patient in hallway acting out. [employer] at bedside as well.   This writer reassured patient he would get food after checking vital signs. Pt finally agreeable.   With [employer] at doorway, this writer performed vital signs and gave patient food and a coke.    Pt was then escorted off campus.

## 2022-07-28 NOTE — Discharge Instructions (Signed)
Follow up with your family doc.  °

## 2022-07-28 NOTE — ED Provider Notes (Signed)
[facility] Provider Note   CSN: [id number] Arrival date & time: 07/28/22  2109     History  Chief Complaint  Patient presents with   Generalized Pain    Roberto Osborn is a 52 y.o. male.  52 yo M with a cc wanting something to eat.  He initially checked him saying that his arm hurt and then ultimately wanted a sandwich and then when he did get 1 immediately he became very angry and started yelling.        Home Medications Prior to Admission medications   Medication Sig Start Date End Date Taking? Authorizing Provider  citalopram (CELEXA) 10 MG tablet Take 1 tablet (10 mg total) by mouth daily. Patient not taking: Reported on 01/23/2022 08/28/21 01/23/22  Regan Lemming, MD  risperiDONE (RISPERDAL) 1 MG tablet Take 1 tablet (1 mg total) by mouth at bedtime. Patient not taking: Reported on 01/23/2022 08/28/21 01/23/22  Regan Lemming, MD      Allergies    Haloperidol, Risperdal [risperidone], and Trazodone    Review of Systems   Review of Systems  Physical Exam Updated Vital Signs There were no vitals taken for this visit. Physical Exam Vitals and nursing note reviewed.  Constitutional:      Appearance: He is well-developed.  HENT:     Head: Normocephalic and atraumatic.  Eyes:     Pupils: Pupils are equal, round, and reactive to light.  Neck:     Vascular: No JVD.  Cardiovascular:     Rate and Rhythm: Normal rate and regular rhythm.     Heart sounds: No murmur heard.    No friction rub. No gallop.  Pulmonary:     Effort: No respiratory distress.     Breath sounds: No wheezing.  Abdominal:     General: There is no distension.     Tenderness: There is no abdominal tenderness. There is no guarding or rebound.  Musculoskeletal:        General: Normal range of motion.     Cervical back: Normal range of motion and neck supple.  Skin:    Coloration: Skin is not pale.     Findings: No rash.  Neurological:     Mental  Status: He is alert and oriented to person, place, and time.  Psychiatric:        Behavior: Behavior normal.     ED Results / Procedures / Treatments   Labs (all labs ordered are listed, but only abnormal results are displayed) Labs Reviewed - No data to display  EKG None  Radiology No results found.  Procedures Procedures    Medications Ordered in ED Medications - No data to display  ED Course/ Medical Decision Making/ A&P                             Medical Decision Making  52 yo M well-known to this emergency department with 20 visits in the past 6 months comes in with a chief complaints of arm pain.  He change this shortly thereafter with nursing and said that he really was here to get a sandwich.  He then began yelling and making threats to staff members.  I discharged him then home will have him follow-up with his family doctor.  9:27 PM:  I have discussed the diagnosis/risks/treatment options with the patient.  Evaluation and diagnostic testing in the emergency department does not suggest an  emergent condition requiring admission or immediate intervention beyond what has been performed at this time.  They will follow up with PCP. We also discussed returning to the ED immediately if new or worsening sx occur. We discussed the sx which are most concerning (e.g., sudden worsening pain, fever, inability to tolerate by mouth) that necessitate immediate return. Medications administered to the patient during their visit and any new prescriptions provided to the patient are listed below.  Medications given during this visit Medications - No data to display   The patient appears reasonably screen and/or stabilized for discharge and I doubt any other medical condition or other [facility] requiring further screening, evaluation, or treatment in the ED at this time prior to discharge.          Final Clinical Impression(s) / ED Diagnoses Final diagnoses:  Malingering    Rx / DC  Orders ED Discharge Orders     None         Deno Etienne, DO 07/28/22 2127

## 2022-07-28 NOTE — ED Triage Notes (Signed)
Pt is non compliant with questions until he gets something to eat.

## 2022-07-31 ENCOUNTER — Encounter (HOSPITAL_COMMUNITY): Payer: Self-pay

## 2022-07-31 ENCOUNTER — Emergency Department (HOSPITAL_COMMUNITY)
Admission: EM | Admit: 2022-07-31 | Discharge: 2022-07-31 | Disposition: A | Discharge status: Home / Self Care | Payer: Medicare PPO | Attending: Emergency Medicine | Admitting: Emergency Medicine

## 2022-07-31 DIAGNOSIS — Z1152 Encounter for screening for COVID-19: Secondary | ICD-10-CM | POA: Diagnosis not present

## 2022-07-31 DIAGNOSIS — J069 Acute upper respiratory infection, unspecified: Secondary | ICD-10-CM | POA: Insufficient documentation

## 2022-07-31 DIAGNOSIS — R531 Weakness: Secondary | ICD-10-CM | POA: Diagnosis not present

## 2022-07-31 DIAGNOSIS — M7918 Myalgia, other site: Secondary | ICD-10-CM | POA: Diagnosis present

## 2022-07-31 HISTORY — DX: Bipolar disorder, unspecified: F31.9

## 2022-07-31 LAB — RESP PANEL BY RT-PCR (RSV, FLU A&B, COVID)  RVPGX2
Influenza A by PCR: NEGATIVE
Influenza B by PCR: NEGATIVE
Resp Syncytial Virus by PCR: NEGATIVE
SARS Coronavirus 2 by RT PCR: NEGATIVE

## 2022-07-31 MED ORDER — ACETAMINOPHEN 500 MG PO TABS
1000.0000 mg | ORAL_TABLET | Freq: Once | ORAL | Status: AC
  Administered 2022-07-31: 1000 mg via ORAL
  Filled 2022-07-31: qty 2

## 2022-07-31 NOTE — Discharge Instructions (Addendum)
Your COVID and flu testing was negative, your symptoms are consistent with a upper respiratory infection, recommend Tylenol and ibuprofen for pain control and muscle aches.

## 2022-07-31 NOTE — ED Notes (Signed)
Pt refused discharge vitals 

## 2022-07-31 NOTE — ED Notes (Signed)
Pt refused vitals at this time we try again later

## 2022-07-31 NOTE — ED Triage Notes (Signed)
[facility] reports pt coming from [place] [employer] c/o pain all over his body. Could not give EMS a specific spot. Pt is homeless.

## 2022-07-31 NOTE — ED Provider Notes (Signed)
[place] AT [facility] Provider Note   CSN: [id number] Arrival date & time: 07/31/22  1345     History  Chief Complaint  Patient presents with   Generalized Body Aches    VALTON HAPP is a 52 y.o. male.  HPI   52 year old male presenting to the emergency department with generalized bodyaches.  The patient has a history of multiple presentations to the emergency department for malingering with various complaints.  On my evaluation, the patient was asking for a sandwich.  He endorses generalized body aches and states "I am sick."  He sounds mildly congested and endorses URI symptoms but will not say for how long.  No chest pain, no shortness of breath.  Endorses chronic muscle aches and requests Tylenol.  Home Medications Prior to Admission medications   Medication Sig Start Date End Date Taking? Authorizing Provider  citalopram (CELEXA) 10 MG tablet Take 1 tablet (10 mg total) by mouth daily. Patient not taking: Reported on 01/23/2022 08/28/21 01/23/22  Regan Lemming, MD  risperiDONE (RISPERDAL) 1 MG tablet Take 1 tablet (1 mg total) by mouth at bedtime. Patient not taking: Reported on 01/23/2022 08/28/21 01/23/22  Regan Lemming, MD      Allergies    Haloperidol, Risperdal [risperidone], and Trazodone    Review of Systems   Review of Systems  All other systems reviewed and are negative.   Physical Exam Updated Vital Signs BP 109/73   Pulse (!) 106   Temp 98.7 F (37.1 C)   Resp 18   SpO2 99%  Physical Exam Vitals and nursing note reviewed.  Constitutional:      General: He is not in acute distress.    Appearance: He is well-developed.  HENT:     Head: Normocephalic and atraumatic.  Eyes:     Conjunctiva/sclera: Conjunctivae normal.  Cardiovascular:     Rate and Rhythm: Normal rate and regular rhythm.     Pulses: Normal pulses.  Pulmonary:     Effort: Pulmonary effort is normal. No respiratory distress.     Breath sounds:  Normal breath sounds.  Abdominal:     Palpations: Abdomen is soft.     Tenderness: There is no abdominal tenderness.  Musculoskeletal:        General: No swelling.     Cervical back: Neck supple.  Skin:    General: Skin is warm and dry.     Capillary Refill: Capillary refill takes less than 2 seconds.  Neurological:     Mental Status: He is alert.  Psychiatric:        Mood and Affect: Mood normal.     ED Results / Procedures / Treatments   Labs (all labs ordered are listed, but only abnormal results are displayed) Labs Reviewed  RESP PANEL BY RT-PCR (RSV, FLU A&B, COVID)  [id number]    EKG None  Radiology No results found.  Procedures Procedures    Medications Ordered in ED Medications  acetaminophen (TYLENOL) tablet 1,000 mg (1,000 mg Oral Given 07/31/22 1433)    ED Course/ Medical Decision Making/ A&P                             Medical Decision Making Risk OTC drugs.     52 year old male presenting to the emergency department with generalized bodyaches.  The patient has a history of multiple presentations to the emergency department for malingering with various complaints.  On my  evaluation, the patient was asking for a sandwich.  He endorses generalized body aches and states "I am sick."  He sounds mildly congested and endorses URI symptoms but will not say for how long.  No chest pain, no shortness of breath.  Endorses chronic muscle aches and requests Tylenol.  On my exam, the patient is well-appearing and well-hydrated.  The patient's lungs are clear to auscultation bilaterally. Additionally, the patient has a soft/non-tender abdomen,  and no oropharyngeal exudates.  There are no signs of meningismus.  I see no signs of an acute bacterial infection.  The patient's presentation is most consistent with a viral upper respiratory infection.  I have a low suspicion for pneumonia as the patient's cough has been non-productive and the patient is neither tachypneic nor  hypoxic on room air.  Additionally, the patient is CTAB.  Plan for Covid and influenza testing, oral rehydration. Pt provided with a sandwich and juice. Labs negative, pt well appearing, non-toxic, likely URI with nasal congestion. Stable for DC.   Final Clinical Impression(s) / ED Diagnoses Final diagnoses:  Upper respiratory tract infection, unspecified type    Rx / DC Orders ED Discharge Orders     None         Regan Lemming, MD 07/31/22 1559

## 2022-08-01 ENCOUNTER — Emergency Department (HOSPITAL_COMMUNITY)
Admission: EM | Admit: 2022-08-01 | Discharge: 2022-08-01 | Discharge status: Against Medical Advice | Payer: Medicare PPO | Source: Home / Self Care

## 2022-08-04 ENCOUNTER — Telehealth: Payer: Self-pay

## 2022-08-04 NOTE — Telephone Encounter (Signed)
        Patient  visited [facility] on 3/31     Telephone encounter attempt :  1st  A HIPAA compliant voice message was left requesting a return call.  Instructed patient to call back.   [facility] [phone] [address], [place], [place] [address] Phone: [phone] Email: [email or web address]

## 2022-10-06 ENCOUNTER — Encounter (HOSPITAL_COMMUNITY): Payer: Self-pay

## 2022-10-06 ENCOUNTER — Other Ambulatory Visit: Payer: Self-pay

## 2022-10-06 ENCOUNTER — Emergency Department (HOSPITAL_COMMUNITY): Payer: Medicare PPO

## 2022-10-06 ENCOUNTER — Emergency Department (HOSPITAL_COMMUNITY)
Admission: EM | Admit: 2022-10-06 | Discharge: 2022-10-06 | Discharge status: Against Medical Advice | Payer: Medicare PPO | Attending: Emergency Medicine | Admitting: Emergency Medicine

## 2022-10-06 DIAGNOSIS — F29 Unspecified psychosis not due to a substance or known physiological condition: Secondary | ICD-10-CM | POA: Insufficient documentation

## 2022-10-06 DIAGNOSIS — R253 Fasciculation: Secondary | ICD-10-CM | POA: Insufficient documentation

## 2022-10-06 DIAGNOSIS — H0589 Other disorders of orbit: Secondary | ICD-10-CM | POA: Diagnosis not present

## 2022-10-06 DIAGNOSIS — R509 Fever, unspecified: Secondary | ICD-10-CM | POA: Diagnosis not present

## 2022-10-06 DIAGNOSIS — R000 Tachycardia, unspecified: Secondary | ICD-10-CM | POA: Diagnosis not present

## 2022-10-06 DIAGNOSIS — Z0389 Encounter for observation for other suspected diseases and conditions ruled out: Secondary | ICD-10-CM | POA: Diagnosis not present

## 2022-10-06 DIAGNOSIS — F17210 Nicotine dependence, cigarettes, uncomplicated: Secondary | ICD-10-CM | POA: Insufficient documentation

## 2022-10-06 DIAGNOSIS — R440 Auditory hallucinations: Secondary | ICD-10-CM | POA: Diagnosis present

## 2022-10-06 MED ORDER — STERILE WATER FOR INJECTION IJ SOLN
INTRAMUSCULAR | Status: AC
  Administered 2022-10-06: 1.2 mL
  Filled 2022-10-06: qty 10

## 2022-10-06 MED ORDER — SODIUM CHLORIDE 0.9 % IV BOLUS: 1000.0000 mL | Freq: Once | INTRAVENOUS | Status: DC

## 2022-10-06 MED ORDER — ZIPRASIDONE MESYLATE 20 MG IM SOLR
20.0000 mg | Freq: Once | INTRAMUSCULAR | Status: AC
  Administered 2022-10-06: 20 mg via INTRAMUSCULAR
  Filled 2022-10-06: qty 20

## 2022-10-06 NOTE — ED Notes (Signed)
Pt refused to be stuck for CBG.

## 2022-10-06 NOTE — ED Triage Notes (Signed)
Pt coming in via EMS. Bystander saw patient twitching in the grass and called for medical attention. Pt ambulatory and alert on EMS arrival. Pt refusing to answer questions with EMS.

## 2022-10-06 NOTE — ED Notes (Addendum)
Pt refused to give urine to this Clinical research associate. Rn notified

## 2022-10-06 NOTE — ED Notes (Addendum)
Patient refusing blood work, IV, and IV fluids. MD aware.

## 2022-10-06 NOTE — ED Provider Notes (Addendum)
[place] EMERGENCY DEPARTMENT AT [facility] Provider Note  CSN: [id number] Arrival date & time: 10/06/22 1507  Chief Complaint(s) Medical Clearance  HPI Roberto Osborn is a 52 y.o. male history of homelessness, bipolar disorder, schizophrenia presenting to the emergency department found on the sidewalk by bystanders.  Patient was apparently sitting on the sidewalk and "twitching".  Bystanders called EMS.  When fire department arrived patient was up and walking around, did not seem postictal.  Patient has requested to be transferred to [facility].  The patient reports that he feels well and is laughing to himself.  He reports that he is hearing voices.  He denies any pain or any other symptoms.   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 Authorizing Provider  citalopram (CELEXA) 10 MG tablet Take 1 tablet (10 mg total) by mouth daily. Patient not taking: Reported on 01/23/2022 08/28/21 01/23/22  Ernie Avena, MD  risperiDONE (RISPERDAL) 1 MG tablet Take 1 tablet (1 mg total) by mouth at bedtime. Patient not taking: Reported on 01/23/2022 08/28/21 01/23/22  Ernie Avena, MD                                                                                                                                    Past Surgical History History reviewed. No pertinent surgical history. Family History History reviewed. No  pertinent family history.  Social History Social History   Tobacco Use   Smoking status: Every Day    Types: Cigarettes, Cigars  Vaping Use   Vaping Use: Never used  Substance Use Topics   Alcohol use: Yes    Comment: occasionally   Drug use: Not Currently    Types: Marijuana   Allergies Haloperidol, Risperdal [risperidone], and Trazodone  Review of Systems Review of Systems  All other systems reviewed and are negative.   Physical Exam Vital Signs  I have reviewed the triage vital signs BP 121/77   Pulse (!) 103   Temp 98.2 F (36.8 C) (Oral)   Resp 17   SpO2 99%  Physical Exam Vitals and nursing note reviewed.  Constitutional:      General: He is not in acute distress.    Appearance: Normal appearance.     Comments: Disheveled, already wearing hospital scrubs  HENT:     Mouth/Throat:     Mouth: Mucous membranes are moist.  Eyes:  Conjunctiva/sclera: Conjunctivae normal.  Cardiovascular:     Rate and Rhythm: Regular rhythm. Tachycardia present.  Pulmonary:     Effort: Pulmonary effort is normal. No respiratory distress.     Breath sounds: Normal breath sounds.  Abdominal:     General: Abdomen is flat.     Palpations: Abdomen is soft.     Tenderness: There is no abdominal tenderness.  Musculoskeletal:     Right lower leg: No edema.     Left lower leg: No edema.  Skin:    General: Skin is warm and dry.     Capillary Refill: Capillary refill takes less than 2 seconds.  Neurological:     Mental Status: He is alert and oriented to person, place, and time. Mental status is at baseline.  Psychiatric:     Comments: Smiling, laughing to himself, endorses AVH, can't explain what is funny, denies SI or HI     ED Results and Treatments Labs (all labs ordered are listed, but only abnormal results are displayed) Labs Reviewed  COMPREHENSIVE METABOLIC PANEL  ETHANOL  RAPID URINE DRUG SCREEN, HOSP PERFORMED  CBC WITH DIFFERENTIAL/PLATELET  CK  URINALYSIS,  ROUTINE W REFLEX MICROSCOPIC  CBG MONITORING, ED                                                                                                                          Radiology DG Chest Portable 1 View  Result Date: 10/06/2022 CLINICAL DATA:  Possible seizure. EXAM: PORTABLE CHEST 1 VIEW COMPARISON:  None Available. FINDINGS: The heart size and mediastinal contours are within normal limits. Both lungs are clear. Multilevel degenerative changes seen within the mid and lower thoracic spine. IMPRESSION: No active disease. Electronically Signed   By: Aram Candela M.D.   On: 10/06/2022 16:21   CT Head Wo Contrast  Result Date: 10/06/2022 CLINICAL DATA:  Possible seizure. EXAM: CT HEAD WITHOUT CONTRAST TECHNIQUE: Contiguous axial images were obtained from the base of the skull through the vertex without intravenous contrast. RADIATION DOSE REDUCTION: This exam was performed according to the departmental dose-optimization program which includes automated exposure control, adjustment of the mA and/or kV according to patient size and/or use of iterative reconstruction technique. COMPARISON:  May 21, 2022 FINDINGS: Brain: No evidence of acute infarction, hemorrhage, hydrocephalus, extra-axial collection or mass lesion/mass effect. Vascular: No hyperdense vessel or unexpected calcification. Skull: Normal. Negative for fracture or focal lesion. Sinuses/Orbits: There is marked severity left maxillary sinus and bilateral ethmoid sinus mucosal thickening. A chronic appearing deformity is seen involving the medial wall of the right orbit. Additional chronic deformity of the right globe is seen with posterior displacement of the right lens. Other: None. IMPRESSION: 1. No acute intracranial abnormality. 2. Marked severity left maxillary sinus and bilateral ethmoid sinus disease. 3. 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 Electronically  Signed   By: Aram Candela M.D.   On:  10/06/2022 16:19    Pertinent labs & imaging results that were available during my care of the patient were reviewed by me and considered in my medical decision making (see MDM for details).  Medications Ordered in ED Medications  sodium chloride 0.9 % bolus 1,000 mL (has no administration in time range)  ziprasidone (GEODON) injection 20 mg (20 mg Intramuscular Given 10/06/22 1614)  sterile water (preservative free) injection (1.2 mLs  Given 10/06/22 1614)                                                                                                                                     Procedures Procedures  (including critical care time)  Medical Decision Making / ED Course   MDM:  52 year old male presenting after being found on sidewalk pedestrian.  Patient was reportedly having "twitching", not observed here.  Patient appears to be reacting to internal stimuli.  No history of close requesting provider department.  Suspect chronic psychotic disorder, will obtain screening labs.  Will also check CK episode of twitching.  will obtain CT head but doubt acute intracranial process. Will check ethanol, UDS, tylenol and acetaminophen level. No fever here to suggest infection, EMS reported a fever in the field but patient reported to them he had been lying outside all day, lower concern for acute infectious process. Although patient appears chronically psychotic he denies HI or SI. He agreed to receive IM dose of geodon.  Clinical Course as of 10/06/22 1655  Thu Oct 06, 2022  1652 Patient eloped as he wants to smoke a cigarette. Patient alert and oriented, not willing to stay for AMA discussion or psychiatric evaluation. He denies HI or SI, and does not appear malnourished and he is wearing weather appropriate clothing. I don't think he meets criteria for involuntary commitment at this time. Encouraged to return at any time.  [WS]    Clinical Course  User Index [WS] Lonell Grandchild, MD     Additional history obtained: -Additional history obtained from ems -External records from outside source obtained and reviewed including: Chart review including previous notes, labs, imaging, consultation notes including previous ER visits for similar   Lab Tests: -I ordered, reviewed, and interpreted labs.   The pertinent results include:   Labs Reviewed  COMPREHENSIVE METABOLIC PANEL  ETHANOL  RAPID URINE DRUG SCREEN, HOSP PERFORMED  CBC WITH DIFFERENTIAL/PLATELET  CK  URINALYSIS, ROUTINE W REFLEX MICROSCOPIC  CBG MONITORING, ED    Notable for patient refused labs  Imaging Studies ordered: I ordered imaging studies including CXR, CT head On my interpretation imaging demonstrates no acute process I independently visualized and interpreted imaging. I agree with the radiologist interpretation   Medicines ordered and prescription drug management: Meds ordered this encounter  Medications   sodium chloride 0.9 % bolus 1,000 mL   ziprasidone (GEODON) injection 20 mg   sterile water (preservative free) injection    Kiser,  Sheria Lang G: cabinet override    -I have reviewed the patients home medicines and have made adjustments as needed   Social Determinants of Health:  Diagnosis or treatment significantly limited by social determinants of health: homelessness   Reevaluation: After the interventions noted above, I reevaluated the patient and found that their symptoms have improved  Co morbidities that complicate the patient evaluation  Past Medical History:  Diagnosis Date   Bipolar 1 disorder (HCC)    Homeless       Dispostion: Disposition decision including need for hospitalization was considered, and patient Eloped    Final Clinical Impression(s) / ED Diagnoses Final diagnoses:  Chronic psychosis (HCC)     This chart was dictated using voice recognition software.  Despite best efforts to proofread,  errors can  occur which can change the documentation meaning.    Lonell Grandchild, MD 10/06/22 1655    Lonell Grandchild, MD 10/06/22 [phone]

## 2022-10-06 NOTE — ED Notes (Signed)
Patient began throwing EKG electrodes at staff and saying "I dont want no blood work."

## 2022-10-06 NOTE — ED Notes (Signed)
Patient yelled "I want my cigarette lighter I need to smoke." Patient walked out of EMS bay. Dr. Suezanne Jacquet at bedside and notified.

## 2022-10-11 ENCOUNTER — Telehealth: Payer: Self-pay

## 2022-10-11 NOTE — Telephone Encounter (Signed)
Transition Care Management Unsuccessful Follow-up Telephone Call  Date of discharge and from where:  [facility] 6/6  Attempts:  1st Attempt  Reason for unsuccessful TCM follow-up call:  Missing or invalid number.   Lenard Forth [facility] Guide, [facility] [phone] [address] [place], [place], [place] [address] Phone: [phone] Email: [email or web address]

## 2023-08-02 ENCOUNTER — Telehealth: Payer: Self-pay

## 2023-08-02 ENCOUNTER — Encounter: Payer: Self-pay | Admitting: Physician Assistant

## 2023-08-02 ENCOUNTER — Ambulatory Visit: Admitting: Physician Assistant

## 2023-08-02 VITALS — BP 109/69 | HR 124 | Wt 174.0 lb

## 2023-08-02 DIAGNOSIS — R000 Tachycardia, unspecified: Secondary | ICD-10-CM

## 2023-08-02 DIAGNOSIS — Z5320 Procedure and treatment not carried out because of patient's decision for unspecified reasons: Secondary | ICD-10-CM

## 2023-08-02 DIAGNOSIS — Z5902 Unsheltered homelessness: Secondary | ICD-10-CM

## 2023-08-02 DIAGNOSIS — R42 Dizziness and giddiness: Secondary | ICD-10-CM

## 2023-08-02 NOTE — Progress Notes (Signed)
 New Patient Office Visit  Subjective    Patient ID: Roberto Osborn, male    DOB: 10/14/1970  Age: 53 y.o. MRN: [id number]  CC:  Chief Complaint  Patient presents with   Dizziness   Community Resources    Patient expressed need for Shelter.    Discussed the use of AI scribe software for clinical note transcription with the patient, who gave verbal consent to proceed.  History of Present Illness   The patient, with a history of schizophrenia managed with Celexa and Risperdal, presents with dizziness for the past month. The dizziness is described as a sensation of falling backwards, particularly when standing, and has resulted in falls.    States that he drinks plenty of water on a daily basis.  States that he has only drank one beer today.  States that he does not monitor his pulse or BP during these events.   The patient's sleep is poor, which he attributes to his current living situation. He is homeless and expresses a desire to stay in a shelter. Despite this, he reports adequate food and water intake.   Outpatient Encounter Medications as of 08/02/2023  Medication Sig   citalopram (CELEXA) 10 MG tablet Take 1 tablet (10 mg total) by mouth daily. (Patient not taking: Reported on 01/23/2022)   risperiDONE (RISPERDAL) 1 MG tablet Take 1 tablet (1 mg total) by mouth at bedtime. (Patient not taking: Reported on 01/23/2022)   No facility-administered encounter medications on file as of 08/02/2023.    Past Medical History:  Diagnosis Date   Bipolar 1 disorder (HCC)    Homeless     History reviewed. No pertinent surgical history.  History reviewed. No pertinent family history.  Social History   Socioeconomic History   Marital status: Single    Spouse name: Not on file   Number of children: Not on file   Years of education: Not on file   Highest education level: Not on file  Occupational History   Not on file  Tobacco Use   Smoking status: Every Day    Types: Cigarettes,  Cigars   Smokeless tobacco: Not on file  Vaping Use   Vaping status: Never Used  Substance and Sexual Activity   Alcohol use: Yes   Drug use: Not Currently    Types: Marijuana   Sexual activity: Not on file  Other Topics Concern   Not on file  Social History Narrative   Not on file   Social Drivers of Health   Financial Resource Strain: Not on file  Food Insecurity: High Risk (03/24/2022)   Received from [facility], [facility]   Food Insecurity    Within the past 12 months, did the food you bought just not last and you didn't have money to get more?: Yes    Within the past 12 months, did you worry that your food would run out before you got money to buy more?: Yes  Transportation Needs: Low Risk  (03/24/2022)   Received from [facility], [facility]   Transportation Needs    Within the past 12 months, has a lack of transportation kept you from medical appointments or from doing things needed for daily living?: No  Physical Activity: Not on file  Stress: Not on file  Social Connections: Not on file  Intimate Partner Violence: Not on file    Review of Systems  Constitutional:  Negative for chills and fever.  HENT: Negative.    Eyes:  Negative for blurred vision.  Respiratory:  Negative for shortness of breath.   Cardiovascular:  Negative for chest pain and palpitations.  Gastrointestinal: Negative.   Genitourinary: Negative.   Musculoskeletal: Negative.   Skin: Negative.   Neurological:  Positive for dizziness. Negative for seizures, loss of consciousness, weakness and headaches.  Endo/Heme/Allergies: Negative.   Psychiatric/Behavioral: Negative.          Objective    BP 109/69 (BP Location: Left Arm, Patient Position: Sitting, Cuff Size: Large)   Pulse (!) 124   Wt 174 lb (78.9 kg)   BMI 22.96 kg/m   Physical Exam Vitals and nursing note reviewed.  Constitutional:      Appearance: Normal  appearance.  HENT:     Head: Normocephalic and atraumatic.     Right Ear: External ear normal.     Left Ear: External ear normal.     Nose: Nose normal.     Mouth/Throat:     Mouth: Mucous membranes are moist.     Pharynx: Oropharynx is clear.  Eyes:     Extraocular Movements: Extraocular movements intact.     Conjunctiva/sclera: Conjunctivae normal.     Pupils: Pupils are equal, round, and reactive to light.  Cardiovascular:     Rate and Rhythm: Regular rhythm. Tachycardia present.     Pulses: Normal pulses.     Heart sounds: Normal heart sounds.  Pulmonary:     Effort: Pulmonary effort is normal.     Breath sounds: Normal breath sounds.  Musculoskeletal:        General: Normal range of motion.     Cervical back: Normal range of motion and neck supple.  Skin:    General: Skin is warm and dry.  Neurological:     General: No focal deficit present.     Mental Status: He is alert and oriented to person, place, and time.  Psychiatric:        Attention and Perception: Attention normal.        Mood and Affect: Mood is anxious.        Speech: Speech normal.        Behavior: Behavior is cooperative.        Thought Content: Thought content normal.        Cognition and Memory: Cognition normal.        Judgment: Judgment normal.        Assessment & Plan:   Problem List Items Addressed This Visit   None Visit Diagnoses       Dizziness    -  Primary     Tachycardia         Patient refusal of treatment         Unsheltered homelessness          1. Dizziness (Primary) Patient declined any testing.  Patient encouraged to follow-up with mobile unit again as needed.  Patient education given on supportive care.  Red flags given for prompt reevaluation.  No AVS created, patient declined taking any papers with him, declined MyChart  2. Tachycardia   3. Patient refusal of treatment   4. Unsheltered homelessness   I have reviewed the patient's medical history (PMH, PSH,  Social History, Family History, Medications, and allergies) , and have been updated if relevant. I spent 20 minutes reviewing chart and  face to face time with patient.   Return if symptoms worsen or fail to improve.   Tremon Sainvil S Mayers,  PA-C

## 2023-08-02 NOTE — Telephone Encounter (Signed)
 Patient presents to the Mobile unit with concerns of balance, and desired to have a physical completed. During intake patient expressed needs for shelter, But doesn't have a mobile device to call.  Call was placed for patient to Coordinated entry line: [phone]  A list was provided based off patients demographics- [facility]. [facility] was informed [facility] of next week. He was encouraged to go as early as 7am for it is first come first serve.  Patient agreed and refused all other services by the unit. He existed the unit walking.

## 2023-08-03 ENCOUNTER — Emergency Department (HOSPITAL_COMMUNITY)
Admission: EM | Admit: 2023-08-03 | Discharge: 2023-08-04 | Disposition: A | Discharge status: Home / Self Care | Attending: Emergency Medicine | Admitting: Emergency Medicine

## 2023-08-03 ENCOUNTER — Other Ambulatory Visit: Payer: Self-pay

## 2023-08-03 ENCOUNTER — Encounter (HOSPITAL_COMMUNITY): Payer: Self-pay

## 2023-08-03 DIAGNOSIS — R000 Tachycardia, unspecified: Secondary | ICD-10-CM | POA: Insufficient documentation

## 2023-08-03 DIAGNOSIS — Z5900 Homelessness unspecified: Secondary | ICD-10-CM | POA: Insufficient documentation

## 2023-08-03 LAB — RESP PANEL BY RT-PCR (RSV, FLU A&B, COVID)  RVPGX2
Influenza A by PCR: NEGATIVE
Influenza B by PCR: NEGATIVE
Resp Syncytial Virus by PCR: NEGATIVE
SARS Coronavirus 2 by RT PCR: NEGATIVE

## 2023-08-03 LAB — CBG MONITORING, ED: Glucose-Capillary: 108 mg/dL — ABNORMAL HIGH (ref 70–99)

## 2023-08-03 MED ORDER — LACTATED RINGERS IV BOLUS: 1000.0000 mL | Freq: Once | INTRAVENOUS | Status: DC

## 2023-08-03 NOTE — ED Provider Notes (Signed)
 [facility]-EMERGENCY DEPT [facility] Emergency Department Provider Note MRN:  [id number]  Arrival date & time: 08/04/23     Chief Complaint   Generalized Body Aches   History of Present Illness   Roberto Osborn is a 53 y.o. year-old male presents to the ED with chief complaint of homelessness.  He states that he needs somewhere to rest.  He states that he can't stay outside any longer.  He denies recent illnesses.  He denies alcohol or drug use.  He denies any complaints other than asking for somewhere to sleep.  History provided by patient.   Review of Systems  Pertinent positive and negative review of systems noted in HPI.    Physical Exam   Vitals:   08/03/23 2141 08/03/23 2343  BP: (!) 140/82 113/81  Pulse: (!) 121 (!) 116  Resp: (!) 22 16  Temp: 98.6 F (37 C) 98.5 F (36.9 C)  SpO2: 100% 95%    CONSTITUTIONAL:  non toxic-appearing, NAD NEURO:  Drowsy, but awakens to voice and oriented x 3, CN 3-12 grossly intact EYES:  eyes equal and reactive ENT/NECK:  Supple, no stridor  CARDIO:  tachycardic, regular rhythm, appears well-perfused  PULM:  No respiratory distress,  GI/GU:  non-distended,  MSK/SPINE:  No gross deformities, no edema, moves all extremities  SKIN:  no rash, atraumatic   *Additional and/or pertinent findings included in MDM below  Diagnostic and Interventional Summary    EKG Interpretation Date/Time:  Thursday August 03 2023 22:38:59 EDT Ventricular Rate:  107 PR Interval:  118 QRS Duration:  86 QT Interval:  332 QTC Calculation: 443 R Axis:   87  Text Interpretation: Sinus tachycardia Otherwise normal ECG When compared with ECG of 21-May-2022 17:54, PREVIOUS ECG IS PRESENT Confirmed by Kristine Royal [phone]) on 08/03/2023 11:08:38 PM       Labs Reviewed  CBG MONITORING, ED - Abnormal; Notable for the following components:      Result Value   Glucose-Capillary 108 (*)    All other components within normal limits  RESP PANEL BY RT-PCR  (RSV, FLU A&B, COVID)  RVPGX2    No orders to display    Medications - No data to display   Procedures  /  Critical Care Procedures  ED Course and Medical Decision Making  I have reviewed the triage vital signs, the nursing notes, and pertinent available records from the EMR.  Social Determinants Affecting Complexity of Care: Patient has no clinically significant social determinants affecting this chief complaint..   ED Course: Clinical Course as of 08/04/23 0613  Fri Aug 04, 2023  0001 Patient refusing labs or additional workup and requesting discharge.  [RB]    Clinical Course User Index [RB] Roxy Horseman, PA-C    Medical Decision Making Patient states that he is here because he cannot spend another night outside.  He states he is going to die if he stays outside.  He denies any other symptoms.  He states that he just needs somewhere to rest.  He is noted to be tachycardic, so will check labs and EKG.   Patient refusing workup and labs.  I told him we'd need to give his room to someone else if he didn't want evaluation.  He told me to go ahead and discharge him.  Amount and/or Complexity of Data Reviewed ECG/medicine tests: ordered.         Consultants: No consultations were needed in caring for this patient.   Treatment and Plan: Emergency department  workup does not suggest an emergent condition requiring admission or immediate intervention beyond  what has been performed at this time. The patient is safe for discharge and has  been instructed to return immediately for worsening symptoms, change in  symptoms or any other concerns    Final Clinical Impressions(s) / ED Diagnoses     ICD-10-CM   1. Homeless  Z59.00       ED Discharge Orders     None         Discharge Instructions Discussed with and Provided to Patient:   Discharge Instructions   None      Roxy Horseman, PA-C 08/04/23 [id number]    Wynetta Fines, MD 08/04/23 [phone]

## 2023-08-03 NOTE — ED Triage Notes (Signed)
 Pt BIB [facility] d/t body aches.  Pt was picked up from outside the [place] [facility].  Pt states he will die if he has to stay outside again per EMS.

## 2023-08-03 NOTE — ED Notes (Signed)
Pt refusing blood work and IV.

## 2023-08-08 ENCOUNTER — Encounter (HOSPITAL_COMMUNITY): Payer: Self-pay

## 2023-08-08 ENCOUNTER — Other Ambulatory Visit: Payer: Self-pay

## 2023-08-08 ENCOUNTER — Emergency Department (HOSPITAL_COMMUNITY)
Admission: EM | Admit: 2023-08-08 | Discharge: 2023-08-08 | Disposition: A | Discharge status: Home / Self Care | Attending: Emergency Medicine | Admitting: Emergency Medicine

## 2023-08-08 DIAGNOSIS — Z5900 Homelessness unspecified: Secondary | ICD-10-CM | POA: Insufficient documentation

## 2023-08-08 DIAGNOSIS — X31XXXA Exposure to excessive natural cold, initial encounter: Secondary | ICD-10-CM | POA: Insufficient documentation

## 2023-08-08 DIAGNOSIS — F319 Bipolar disorder, unspecified: Secondary | ICD-10-CM | POA: Insufficient documentation

## 2023-08-08 DIAGNOSIS — G8929 Other chronic pain: Secondary | ICD-10-CM | POA: Diagnosis not present

## 2023-08-08 DIAGNOSIS — M542 Cervicalgia: Secondary | ICD-10-CM | POA: Diagnosis not present

## 2023-08-08 DIAGNOSIS — F259 Schizoaffective disorder, unspecified: Secondary | ICD-10-CM | POA: Insufficient documentation

## 2023-08-08 DIAGNOSIS — M79672 Pain in left foot: Secondary | ICD-10-CM | POA: Insufficient documentation

## 2023-08-08 DIAGNOSIS — Z765 Malingerer [conscious simulation]: Secondary | ICD-10-CM | POA: Insufficient documentation

## 2023-08-08 DIAGNOSIS — M79671 Pain in right foot: Secondary | ICD-10-CM | POA: Diagnosis present

## 2023-08-08 DIAGNOSIS — F17210 Nicotine dependence, cigarettes, uncomplicated: Secondary | ICD-10-CM | POA: Insufficient documentation

## 2023-08-08 DIAGNOSIS — Z5321 Procedure and treatment not carried out due to patient leaving prior to being seen by health care provider: Secondary | ICD-10-CM | POA: Diagnosis not present

## 2023-08-08 MED ORDER — ACETAMINOPHEN 500 MG PO TABS
1000.0000 mg | ORAL_TABLET | Freq: Once | ORAL | Status: AC
  Administered 2023-08-08: 1000 mg via ORAL
  Filled 2023-08-08: qty 2

## 2023-08-08 NOTE — ED Notes (Signed)
 Will check pt vitals

## 2023-08-08 NOTE — ED Triage Notes (Signed)
 Pt arrived from bar[facility]brewery, BIB [facility] had bystander called EMS, pt report he was "cold" when EMS advised that was not an appropriate EMS call, he said his bilateral feet hurt. Pt is homeless, tendencies of malingering. Pt ambulatory to triage, steady gait noted. NAD noted.  ESM vitals 140/100 100 HR 98%

## 2023-08-08 NOTE — ED Provider Notes (Signed)
 Emergency Department Provider Note   I have reviewed the triage vital signs and the nursing notes.   HISTORY  Chief Complaint Bilateral Foot Pain   HPI Roberto Osborn is a 53 y.o. male with past history of bipolar disorder and homelessness presents to the emergency department with cold exposure and foot pain.  Overnight temperatures were in the 40s and he initially called EMS due to feeling cold.  On scene began complaining of bilateral foot pain.  Patient walks frequently.  No injuries.  No fevers.  No other complaints.    Past Medical History:  Diagnosis Date   Bipolar 1 disorder (HCC)    Homeless     Review of Systems  Constitutional: No fever/chills Cardiovascular: Denies chest pain. Respiratory: Denies shortness of breath. Gastrointestinal: No abdominal pain.   Musculoskeletal: Positive bilateral foot pain.  Skin: Negative for rash. Neurological: Negative for headaches.   ____________________________________________   PHYSICAL EXAM:  VITAL SIGNS: ED Triage Vitals  Encounter Vitals Group     BP 08/08/23 0100 (!) 132/104     Pulse Rate 08/08/23 0100 (!) 106     Resp 08/08/23 0100 16     Temp 08/08/23 0100 (!) 97.4 F (36.3 C)     Temp Source 08/08/23 0100 Oral     SpO2 08/08/23 0100 99 %     Weight 08/08/23 0100 173 lb 15.1 oz (78.9 kg)     Height 08/08/23 0100 6\' 1"  (1.854 m)   Constitutional: Alert and oriented. Well appearing and in no acute distress. Eyes: Conjunctivae are normal.  Head: Atraumatic. Nose: No congestion/rhinnorhea. Mouth/Throat: Mucous membranes are moist.   Neck: No stridor.   Cardiovascular: Good peripheral circulation. Grossly normal heart sounds.  2+ DP pulses bilaterally in the feet. Respiratory: Normal respiratory effort.   Gastrointestinal: No distention.  Musculoskeletal: No gross deformities of the feet or lower legs.  Small skin breakdown over the third toe on the left foot without blistering or deeper ulceration.   Neurologic:  Normal speech and language. No gross focal neurologic deficits are appreciated.  Skin:  Skin is warm, dry and intact. No rash noted.  ____________________________________________   PROCEDURES  Procedure(s) performed:   Procedures  None  ____________________________________________   INITIAL IMPRESSION / ASSESSMENT AND PLAN / ED COURSE  Pertinent labs & imaging results that were available during my care of the patient were reviewed by me and considered in my medical decision making (see chart for details).   This patient is Presenting for Evaluation of foot pain, which does require a range of treatment options, and is a complaint that involves a high risk of morbidity and mortality.  The Differential Diagnoses include fracture, ulceration, cellulitis, etc.  Critical Interventions-    Medications  acetaminophen (TYLENOL) tablet 1,000 mg (has no administration in time range)    Reassessment after intervention: pain improved.   Medical Decision Making: Summary:  Patient with homelessness presents with foot pain.  Patient has no evidence of critical limb ischemia.  No ulcers or evidence of osteomyelitis.  No indication for imaging.  Patient has been here resting and allowing to warm.  Will eat.  Stable for discharge.  Patient's presentation is most consistent with acute presentation with potential threat to life or bodily function.   Disposition: discharge  ____________________________________________  FINAL CLINICAL IMPRESSION(S) / ED DIAGNOSES  Final diagnoses:  Pain in both feet    Note:  This document was prepared using Dragon voice recognition software and may include unintentional  dictation errors.  Alona Bene, MD, [facility]    Akiva Brassfield, Arlyss Repress, MD 08/08/23 [phone]

## 2023-08-09 ENCOUNTER — Emergency Department (HOSPITAL_COMMUNITY)
Admission: EM | Admit: 2023-08-09 | Discharge: 2023-08-09 | Discharge status: Against Medical Advice | Source: Home / Self Care

## 2023-08-09 ENCOUNTER — Encounter (HOSPITAL_COMMUNITY): Payer: Self-pay | Admitting: *Deleted

## 2023-08-09 ENCOUNTER — Other Ambulatory Visit: Payer: Self-pay

## 2023-08-09 ENCOUNTER — Ambulatory Visit (HOSPITAL_COMMUNITY)
Admission: EM | Admit: 2023-08-09 | Discharge: 2023-08-09 | Disposition: A | Discharge status: Home / Self Care | Source: Home / Self Care

## 2023-08-09 DIAGNOSIS — G8929 Other chronic pain: Secondary | ICD-10-CM | POA: Insufficient documentation

## 2023-08-09 DIAGNOSIS — Z5900 Homelessness unspecified: Secondary | ICD-10-CM | POA: Diagnosis not present

## 2023-08-09 DIAGNOSIS — Z5321 Procedure and treatment not carried out due to patient leaving prior to being seen by health care provider: Secondary | ICD-10-CM | POA: Insufficient documentation

## 2023-08-09 DIAGNOSIS — M542 Cervicalgia: Secondary | ICD-10-CM | POA: Insufficient documentation

## 2023-08-09 DIAGNOSIS — Z765 Malingerer [conscious simulation]: Secondary | ICD-10-CM | POA: Diagnosis not present

## 2023-08-09 NOTE — ED Provider Notes (Cosign Needed Addendum)
 Behavioral Health Urgent Care Medical Screening Exam  Patient Name: Roberto Osborn MRN: [id number] Date of Evaluation: 08/09/23 Chief Complaint:  I need somewhere to sleep,  I can't sleep on the floor anymore Diagnosis:  Final diagnoses:  Malingering  Homelessness    History of Present illness: Roberto Osborn is a 53 y.o. male. With a history of homelessness, malingering, schizophrenia, suicidal ideation, behavioral disturbance, and  marijuana abuse, presented to [facility] voluntarily.  Per the patient and need to get off the streets I cannot sleep on the ground anymore it is cold out there.  According to patient he went to the shelters and he Ailene Rud but they would not let him in.  Patient stated my health is not too good so I need a place to stay.  Writer discussed with patient that he needs to reach out to social services or the shelters for housing assistance.  A review of patient records show multiple ED visits.  Patient denies taking any medicines at this current moment and denies seeing a psychiatrist or therapist.  Patient stated that he gets a Radio producer but is not enough for him to get an apartment.    A face-to-face observation of patient, patient is alert and oriented x 4, speech is clear, maintain fair eye contact.  Patient when asked if he is suicidal patient stated not really he just needs somewhere to stay.  Patient denies HI, AVH.  According to the patient he thinks people are talking about him.  Patient reports that he last drank a couple of beers 4 days ago.  Denies using any drugs recently report that he only smokes cigarettes.  At this present moment patient does not seem to be a risk to himself or others denies access to guns.  Patient does not seem to be influenced by internal or external stimuli.  Writer discussed with patient that he needs to reach out to the shelter early in the day so that he can get a room and he cannot wait till it is too late because then  they will be closed at night.  Patient stated that he just need someone to stay for the night.  Writer also discussed with that he does not meet criteria for admissions and need to reach out to the main shelter.   Patient was given the numbers to the shelters.  At the time of this evaluation patient does not pose a risk to himself or others does not seem to be in any medical distress.  Patient is advised to reach out to the shelter for housing assistance.  Recommend discharge for patient to follow up with the resources provided to him.  Flowsheet Row ED from 08/09/2023 in [facility] Emergency Department at [facility] Most recent reading at 08/09/2023  2:04 AM ED from 08/09/2023 in [facility] Most recent reading at 08/09/2023 12:11 AM ED from 08/08/2023 in [facility] Emergency Department at [facility] Most recent reading at 08/08/2023  1:02 AM  C-SSRS RISK CATEGORY No Risk No Risk No Risk       Psychiatric Specialty Exam  Presentation  General Appearance:Disheveled  Eye Contact:Good  Speech:Clear and Coherent  Speech Volume:Normal  Handedness:Right   Mood and Affect  Mood: Anxious  Affect: Congruent   Thought Process  Thought Processes: Coherent  Descriptions of Associations:Intact  Orientation:Full (Time, Place and Person)  Thought Content:WDL  Diagnosis of Schizophrenia or Schizoaffective disorder in past: No data recorded  Duration of Psychotic Symptoms: No data recorded Hallucinations:None  Ideas of Reference:None  Suicidal Thoughts:No  Homicidal Thoughts:No   Sensorium  Memory: Immediate Fair  Judgment: Intact  Insight: None   Executive Functions  Concentration: Fair  Attention Span: Fair  Recall: [employer] of Knowledge: Fair  Language: Fair   Psychomotor Activity  Psychomotor Activity: Normal   Assets  Assets: Desire for Improvement; Housing   Sleep  Sleep: Fair  Number of  hours:  6   Physical Exam: Physical Exam HENT:     Head: Normocephalic.     Nose: Nose normal.  Eyes:     Pupils: Pupils are equal, round, and reactive to light.  Cardiovascular:     Rate and Rhythm: Normal rate.  Pulmonary:     Effort: Pulmonary effort is normal.  Musculoskeletal:        General: Normal range of motion.     Cervical back: Normal range of motion.  Neurological:     General: No focal deficit present.     Mental Status: He is alert.  Psychiatric:        Mood and Affect: Mood normal.        Behavior: Behavior normal.    Review of Systems  Constitutional: Negative.   HENT: Negative.    Eyes: Negative.   Respiratory: Negative.    Cardiovascular: Negative.   Gastrointestinal: Negative.   Genitourinary: Negative.   Musculoskeletal: Negative.   Skin: Negative.   Neurological: Negative.   Psychiatric/Behavioral:  Positive for substance abuse. The patient is nervous/anxious.    Blood pressure 132/74, pulse 76, temperature 97.9 F (36.6 C), temperature source Oral, resp. rate 20, SpO2 100%. There is no height or weight on file to calculate BMI.  Musculoskeletal: Strength & Muscle Tone: within normal limits Gait & Station: normal Patient leans: N/A   [facility] MSE Discharge Disposition for Follow up and Recommendations: Based on my evaluation the patient does not appear to have an emergency medical condition and can be discharged with resources and follow up care in outpatient services for Individual Therapy,  f[facility]u with [facility] shelter   Sindy Guadeloupe, NP 08/09/2023, 5:29 AM

## 2023-08-09 NOTE — ED Notes (Signed)
 Pt. Called x3 for room w/ no response moved to OTF

## 2023-08-09 NOTE — ED Triage Notes (Signed)
 Pt reports chronic neck pain, no injuries,

## 2023-08-09 NOTE — Discharge Instructions (Signed)
F[facility]u with [facility] shelter 

## 2023-08-09 NOTE — Progress Notes (Signed)
   08/09/23 0000  [facility] Triage Screening (Walk-ins at [facility] only)  How Did You Hear About [place]? Self  What Is the Reason for Your Visit/Call Today? Pt presents to [facility] as a voluntary walk-in, unaccompanied with complaint of SI, with no plan or intent. Pt repeatedly stated " I'm dying", but unable to further clarify what he means. Pt reports that his behaviors are fine at the moment, however his health is not good. Pt reports that sleeping on the cold ground is not good for him. Pt has had multiple ED visits in the past for malingering. Pt also has history of schizoaffective disorder. Pt reports being prescribed Depakote and a few other medications that he is not able to remember at this time. Pt currently denies HI,AVH and substance/alcohol use.  How Long Has This Been Causing You Problems? <Week  Have You Recently Had Any Thoughts About Hurting Yourself? Yes  How long ago did you have thoughts about hurting yourself? currently  Are You Planning to Commit Suicide/Harm Yourself At This time? No  Have you Recently Had Thoughts About Hurting Someone [name]? No  Are You Planning To Harm Someone At This Time? No  Physical Abuse Denies  Verbal Abuse Denies  Sexual Abuse Denies  Exploitation of patient/patient's resources Denies  Self-Neglect Denies  Are you currently experiencing any auditory, visual or other hallucinations? No  Have You Used Any Alcohol or Drugs in the Past 24 Hours? No  Do you have any current medical co-morbidities that require immediate attention? No  Clinician description of patient physical appearance/behavior: Cooperative, calm, pleasant  What Do You Feel Would Help You the Most Today? Housing Assistance;Financial Resources;Treatment for Depression or other mood problem;Medication(s);Social Support  If access to [facility] was not available, would you have sought care in the Emergency Department? Yes  Determination of Need Routine (7 days)  Options For Referral Other:  Comment;Outpatient Therapy;Medication Management

## 2023-08-13 ENCOUNTER — Other Ambulatory Visit: Payer: Self-pay

## 2023-08-13 ENCOUNTER — Emergency Department
Admission: EM | Admit: 2023-08-13 | Discharge: 2023-08-13 | Disposition: A | Discharge status: Home / Self Care | Attending: Emergency Medicine | Admitting: Emergency Medicine

## 2023-08-13 DIAGNOSIS — G8929 Other chronic pain: Secondary | ICD-10-CM | POA: Diagnosis not present

## 2023-08-13 DIAGNOSIS — M79671 Pain in right foot: Secondary | ICD-10-CM | POA: Diagnosis present

## 2023-08-13 DIAGNOSIS — M79674 Pain in right toe(s): Secondary | ICD-10-CM | POA: Insufficient documentation

## 2023-08-13 DIAGNOSIS — M25571 Pain in right ankle and joints of right foot: Secondary | ICD-10-CM | POA: Insufficient documentation

## 2023-08-13 MED ORDER — IBUPROFEN 600 MG PO TABS
600.0000 mg | ORAL_TABLET | Freq: Once | ORAL | Status: AC
  Administered 2023-08-13: 600 mg via ORAL
  Filled 2023-08-13: qty 1

## 2023-08-13 NOTE — Discharge Instructions (Signed)
Return to the ER for new or worsening symptoms. °

## 2023-08-13 NOTE — ED Provider Notes (Signed)
   [facility] Provider Note    Event Date/Time   First MD Initiated Contact with Patient 08/13/23 0154     (approximate)   History   Toe Pain   HPI  Roberto Osborn is a 53 year old male presenting to the emergency department for evaluation of ankle and toe pain.  Reports this has been ongoing for many years.  No acute change.  Denies other complaints.     Physical Exam   Triage Vital Signs: ED Triage Vitals  Encounter Vitals Group     BP 08/13/23 0143 135/71     Systolic BP Percentile --      Diastolic BP Percentile --      Pulse Rate 08/13/23 0204 63     Resp 08/13/23 0204 17     Temp 08/13/23 0143 98 F (36.7 C)     Temp Source 08/13/23 0143 Oral     SpO2 08/13/23 0143 92 %     Weight 08/13/23 0204 190 lb (86.2 kg)     Height 08/13/23 0204 6\' 1"  (1.854 m)     Head Circumference --      Peak Flow --      Pain Score 08/13/23 0204 9     Pain Loc --      Pain Education --      Exclude from Growth Chart --     Most recent vital signs: Vitals:   08/13/23 0247 08/13/23 0315  BP:    Pulse: 60 79  Resp:    Temp:    SpO2:  100%     General: Awake, interactive  CV:  Regular rate, good peripheral perfusion.  Resp:  Unlabored respirations.  Abd:  Nondistended.  Neuro:  Symmetric facial movement, fluid speech MSK:  2+ DP pulses bilaterally.  Intact sensation.  Freely ranging ankles without pain.  No focal point tenderness.   ED Results / Procedures / Treatments   Labs (all labs ordered are listed, but only abnormal results are displayed) Labs Reviewed - No data to display   EKG EKG independently reviewed interpreted by myself (ER attending) demonstrates:    RADIOLOGY Imaging independently reviewed and interpreted by myself demonstrates:   Formal Radiology Read:  No results found.  PROCEDURES:  Critical Care performed: No  Procedures   MEDICATIONS ORDERED IN ED: Medications  ibuprofen (ADVIL) tablet 600 mg (600 mg  Oral Given 08/13/23 0248)     IMPRESSION / MDM / ASSESSMENT AND PLAN / ED COURSE  I reviewed the triage vital signs and the nursing notes.  Differential diagnosis includes, but is not limited to, musculoskeletal strain, degenerative changes, low suspicion fracture, dislocation, other significant injury based on clinical history and exam, consideration for malingering  Patient's presentation is most consistent with acute, uncomplicated illness.  53 year old male presenting with chronic ankle pain.  Reassuring exam.  Do not think there is an indication for imaging or labs.  Ordered for ibuprofen for pain control.  Patient discharged in stable condition.      FINAL CLINICAL IMPRESSION(S) / ED DIAGNOSES   Final diagnoses:  Chronic foot pain, unspecified laterality     Rx / DC Orders   ED Discharge Orders     None        Note:  This document was prepared using Dragon voice recognition software and may include unintentional dictation errors.   Claria Crofts, MD 08/13/23 [phone]

## 2023-08-13 NOTE — ED Triage Notes (Signed)
 [place] [facility] - pt is homeless. Per EMS pt states he is having right foot/ankle/ toe pain that has been on going for "20 years". Pt ambulatory, A/ox4, NAD, MAE, breathing even and unlabored.

## 2023-08-13 NOTE — ED Notes (Signed)
 Psychologist, forensic on unit to escort pt off unit

## 2023-08-13 NOTE — ED Notes (Signed)
 Discharge instructions given, pt requests bus pass, advised no bus pass available per CN as no buses run on Sunday, pt advised he is discharged, pt states he needs to stay as he needs a place to sleep, pt refuses to get up or leave hall bed, security called

## 2023-08-13 NOTE — ED Notes (Signed)
 Ginger ale and sandwich bag provided
